# Patient Record
Sex: Female | Born: 1970
Health system: Southern US, Community
[De-identification: ages and names within clinical notes are randomized; demographics above are authoritative.]

## PROBLEM LIST (undated history)

## (undated) DIAGNOSIS — S82201A Unspecified fracture of shaft of right tibia, initial encounter for closed fracture: Secondary | ICD-10-CM

## (undated) DIAGNOSIS — J660 Byssinosis: Secondary | ICD-10-CM

## (undated) DIAGNOSIS — J339 Nasal polyp, unspecified: Secondary | ICD-10-CM

## (undated) DIAGNOSIS — J45909 Unspecified asthma, uncomplicated: Secondary | ICD-10-CM

## (undated) DIAGNOSIS — R0602 Shortness of breath: Secondary | ICD-10-CM

## (undated) DIAGNOSIS — D649 Anemia, unspecified: Secondary | ICD-10-CM

## (undated) HISTORY — PX: FRACTURE SURGERY: SHX138

---

## 1992-04-23 HISTORY — PX: TUBAL LIGATION: SHX77

## 1994-04-23 HISTORY — PX: PATELLA FRACTURE SURGERY: SHX735

## 2009-04-23 HISTORY — PX: NASAL POLYP SURGERY: SHX186

## 2013-05-05 ENCOUNTER — Emergency Department (HOSPITAL_COMMUNITY): Payer: Medicaid Other

## 2013-05-05 ENCOUNTER — Inpatient Hospital Stay (HOSPITAL_COMMUNITY)
Admission: EM | Admit: 2013-05-05 | Discharge: 2013-05-08 | DRG: 203 | Disposition: A | Discharge status: Home / Self Care | Payer: Self-pay | Attending: Internal Medicine | Admitting: Internal Medicine

## 2013-05-05 ENCOUNTER — Encounter (HOSPITAL_COMMUNITY): Payer: Self-pay | Admitting: Emergency Medicine

## 2013-05-05 DIAGNOSIS — Z9119 Patient's noncompliance with other medical treatment and regimen: Secondary | ICD-10-CM

## 2013-05-05 DIAGNOSIS — F411 Generalized anxiety disorder: Secondary | ICD-10-CM | POA: Diagnosis present

## 2013-05-05 DIAGNOSIS — Z825 Family history of asthma and other chronic lower respiratory diseases: Secondary | ICD-10-CM

## 2013-05-05 DIAGNOSIS — J309 Allergic rhinitis, unspecified: Secondary | ICD-10-CM | POA: Diagnosis present

## 2013-05-05 DIAGNOSIS — Z91199 Patient's noncompliance with other medical treatment and regimen due to unspecified reason: Secondary | ICD-10-CM

## 2013-05-05 DIAGNOSIS — Z23 Encounter for immunization: Secondary | ICD-10-CM

## 2013-05-05 DIAGNOSIS — D509 Iron deficiency anemia, unspecified: Secondary | ICD-10-CM | POA: Diagnosis present

## 2013-05-05 DIAGNOSIS — J339 Nasal polyp, unspecified: Secondary | ICD-10-CM | POA: Insufficient documentation

## 2013-05-05 DIAGNOSIS — D649 Anemia, unspecified: Secondary | ICD-10-CM | POA: Diagnosis present

## 2013-05-05 DIAGNOSIS — J45909 Unspecified asthma, uncomplicated: Secondary | ICD-10-CM

## 2013-05-05 DIAGNOSIS — J45901 Unspecified asthma with (acute) exacerbation: Principal | ICD-10-CM | POA: Diagnosis present

## 2013-05-05 HISTORY — DX: Shortness of breath: R06.02

## 2013-05-05 HISTORY — DX: Byssinosis: J66.0

## 2013-05-05 HISTORY — DX: Unspecified asthma, uncomplicated: J45.909

## 2013-05-05 HISTORY — DX: Anemia, unspecified: D64.9

## 2013-05-05 HISTORY — DX: Unspecified fracture of shaft of right tibia, initial encounter for closed fracture: S82.201A

## 2013-05-05 LAB — CBC WITH DIFFERENTIAL/PLATELET
BASOS ABS: 0.1 10*3/uL (ref 0.0–0.1)
BASOS PCT: 1 % (ref 0–1)
EOS ABS: 1 10*3/uL — AB (ref 0.0–0.7)
Eosinophils Relative: 20 % — ABNORMAL HIGH (ref 0–5)
HEMATOCRIT: 33.4 % — AB (ref 36.0–46.0)
Hemoglobin: 10.4 g/dL — ABNORMAL LOW (ref 12.0–15.0)
Lymphocytes Relative: 37 % (ref 12–46)
Lymphs Abs: 1.9 10*3/uL (ref 0.7–4.0)
MCH: 23.9 pg — AB (ref 26.0–34.0)
MCHC: 31.1 g/dL (ref 30.0–36.0)
MCV: 76.8 fL — ABNORMAL LOW (ref 78.0–100.0)
MONO ABS: 0.5 10*3/uL (ref 0.1–1.0)
MONOS PCT: 10 % (ref 3–12)
Neutro Abs: 1.7 10*3/uL (ref 1.7–7.7)
Neutrophils Relative %: 33 % — ABNORMAL LOW (ref 43–77)
Platelets: 418 10*3/uL — ABNORMAL HIGH (ref 150–400)
RBC: 4.35 MIL/uL (ref 3.87–5.11)
RDW: 16.2 % — ABNORMAL HIGH (ref 11.5–15.5)
WBC: 5.3 10*3/uL (ref 4.0–10.5)

## 2013-05-05 LAB — RAPID URINE DRUG SCREEN, HOSP PERFORMED
Amphetamines: NOT DETECTED
BARBITURATES: NOT DETECTED
BENZODIAZEPINES: NOT DETECTED
Cocaine: NOT DETECTED
Opiates: NOT DETECTED
Tetrahydrocannabinol: NOT DETECTED

## 2013-05-05 LAB — BASIC METABOLIC PANEL
BUN: 7 mg/dL (ref 6–23)
CALCIUM: 8.5 mg/dL (ref 8.4–10.5)
CO2: 22 mEq/L (ref 19–32)
CREATININE: 0.76 mg/dL (ref 0.50–1.10)
Chloride: 101 mEq/L (ref 96–112)
Glucose, Bld: 92 mg/dL (ref 70–99)
Potassium: 4.8 mEq/L (ref 3.7–5.3)
Sodium: 137 mEq/L (ref 137–147)

## 2013-05-05 LAB — MRSA PCR SCREENING: MRSA BY PCR: NEGATIVE

## 2013-05-05 MED ORDER — MAGNESIUM SULFATE 40 MG/ML IJ SOLN
2.0000 g | Freq: Once | INTRAMUSCULAR | Status: AC
  Administered 2013-05-05: 2 g via INTRAVENOUS
  Filled 2013-05-05: qty 50

## 2013-05-05 MED ORDER — SODIUM CHLORIDE 0.9 % IV SOLN
INTRAVENOUS | Status: AC
  Administered 2013-05-05: 125 mL/h via INTRAVENOUS
  Administered 2013-05-06: 05:00:00 via INTRAVENOUS

## 2013-05-05 MED ORDER — GUAIFENESIN ER 600 MG PO TB12
600.0000 mg | ORAL_TABLET | Freq: Two times a day (BID) | ORAL | Status: DC
  Administered 2013-05-05 – 2013-05-08 (×6): 600 mg via ORAL
  Filled 2013-05-05 (×7): qty 1

## 2013-05-05 MED ORDER — IPRATROPIUM-ALBUTEROL 0.5-2.5 (3) MG/3ML IN SOLN
3.0000 mL | RESPIRATORY_TRACT | Status: DC
  Administered 2013-05-06 – 2013-05-07 (×9): 3 mL via RESPIRATORY_TRACT
  Filled 2013-05-05 (×9): qty 3

## 2013-05-05 MED ORDER — ONDANSETRON HCL 4 MG/2ML IJ SOLN
4.0000 mg | Freq: Once | INTRAMUSCULAR | Status: AC
  Administered 2013-05-05: 4 mg via INTRAVENOUS
  Filled 2013-05-05: qty 2

## 2013-05-05 MED ORDER — SODIUM CHLORIDE 0.9 % IJ SOLN
3.0000 mL | Freq: Two times a day (BID) | INTRAMUSCULAR | Status: DC
  Administered 2013-05-06 – 2013-05-08 (×4): 3 mL via INTRAVENOUS

## 2013-05-05 MED ORDER — SODIUM CHLORIDE 0.9 % IV BOLUS (SEPSIS)
1000.0000 mL | Freq: Once | INTRAVENOUS | Status: AC
  Administered 2013-05-05: 1000 mL via INTRAVENOUS

## 2013-05-05 MED ORDER — METHYLPREDNISOLONE SODIUM SUCC 125 MG IJ SOLR
80.0000 mg | Freq: Three times a day (TID) | INTRAMUSCULAR | Status: DC
  Administered 2013-05-05 – 2013-05-06 (×2): 80 mg via INTRAVENOUS
  Filled 2013-05-05 (×5): qty 1.28

## 2013-05-05 MED ORDER — SODIUM CHLORIDE 0.9 % IJ SOLN: 3.0000 mL | INTRAMUSCULAR | Status: DC | PRN

## 2013-05-05 MED ORDER — ACETAMINOPHEN 325 MG PO TABS: 650.0000 mg | ORAL_TABLET | Freq: Four times a day (QID) | ORAL | Status: DC | PRN

## 2013-05-05 MED ORDER — DIPHENHYDRAMINE HCL 25 MG PO TABS
25.0000 mg | ORAL_TABLET | Freq: Every evening | ORAL | Status: DC | PRN
  Filled 2013-05-05: qty 1

## 2013-05-05 MED ORDER — ALBUTEROL SULFATE (2.5 MG/3ML) 0.083% IN NEBU: 2.5000 mg | INHALATION_SOLUTION | RESPIRATORY_TRACT | Status: DC | PRN

## 2013-05-05 MED ORDER — SODIUM CHLORIDE 0.9 % IV SOLN: 250.0000 mL | INTRAVENOUS | Status: DC | PRN

## 2013-05-05 MED ORDER — PNEUMOCOCCAL VAC POLYVALENT 25 MCG/0.5ML IJ INJ
0.5000 mL | INJECTION | INTRAMUSCULAR | Status: AC
  Administered 2013-05-06: 0.5 mL via INTRAMUSCULAR
  Filled 2013-05-05: qty 0.5

## 2013-05-05 MED ORDER — ALBUTEROL SULFATE (2.5 MG/3ML) 0.083% IN NEBU
2.5000 mg | INHALATION_SOLUTION | RESPIRATORY_TRACT | Status: DC
  Administered 2013-05-05: 2.5 mg via RESPIRATORY_TRACT

## 2013-05-05 MED ORDER — ALBUTEROL (5 MG/ML) CONTINUOUS INHALATION SOLN
10.0000 mg/h | INHALATION_SOLUTION | Freq: Once | RESPIRATORY_TRACT | Status: AC
  Administered 2013-05-05: 10 mg/h via RESPIRATORY_TRACT

## 2013-05-05 MED ORDER — FLUTICASONE PROPIONATE 50 MCG/ACT NA SUSP
2.0000 | Freq: Every day | NASAL | Status: DC
  Administered 2013-05-05 – 2013-05-07 (×3): 2 via NASAL
  Filled 2013-05-05: qty 16

## 2013-05-05 MED ORDER — ACETAMINOPHEN 650 MG RE SUPP: 650.0000 mg | Freq: Four times a day (QID) | RECTAL | Status: DC | PRN

## 2013-05-05 MED ORDER — ALBUTEROL SULFATE HFA 108 (90 BASE) MCG/ACT IN AERS: 1.0000 | INHALATION_SPRAY | RESPIRATORY_TRACT | Status: DC | PRN

## 2013-05-05 MED ORDER — METHYLPREDNISOLONE SODIUM SUCC 125 MG IJ SOLR
125.0000 mg | Freq: Once | INTRAMUSCULAR | Status: AC
  Administered 2013-05-05: 125 mg via INTRAVENOUS
  Filled 2013-05-05: qty 2

## 2013-05-05 MED ORDER — HEPARIN SODIUM (PORCINE) 5000 UNIT/ML IJ SOLN
5000.0000 [IU] | Freq: Three times a day (TID) | INTRAMUSCULAR | Status: DC
  Administered 2013-05-05 – 2013-05-08 (×7): 5000 [IU] via SUBCUTANEOUS
  Filled 2013-05-05 (×12): qty 1

## 2013-05-05 MED ORDER — ALBUTEROL (5 MG/ML) CONTINUOUS INHALATION SOLN
10.0000 mg/h | INHALATION_SOLUTION | Freq: Once | RESPIRATORY_TRACT | Status: AC
Start: 2013-05-05 — End: 2013-05-05
  Administered 2013-05-05: 10 mg/h via RESPIRATORY_TRACT
  Filled 2013-05-05: qty 20

## 2013-05-05 MED ORDER — IPRATROPIUM BROMIDE 0.02 % IN SOLN
0.5000 mg | RESPIRATORY_TRACT | Status: DC
  Administered 2013-05-05: 0.5 mg via RESPIRATORY_TRACT
  Filled 2013-05-05 (×2): qty 2.5

## 2013-05-05 MED ORDER — IPRATROPIUM BROMIDE 0.02 % IN SOLN
0.5000 mg | RESPIRATORY_TRACT | Status: DC
  Administered 2013-05-05: 0.5 mg via RESPIRATORY_TRACT

## 2013-05-05 MED ORDER — IPRATROPIUM BROMIDE 0.02 % IN SOLN
0.5000 mg | Freq: Once | RESPIRATORY_TRACT | Status: AC
  Administered 2013-05-05: 0.5 mg via RESPIRATORY_TRACT
  Filled 2013-05-05: qty 2.5

## 2013-05-05 MED ORDER — ALBUTEROL SULFATE (2.5 MG/3ML) 0.083% IN NEBU
2.5000 mg | INHALATION_SOLUTION | RESPIRATORY_TRACT | Status: DC
  Administered 2013-05-05: 2.5 mg via RESPIRATORY_TRACT
  Filled 2013-05-05: qty 3

## 2013-05-05 NOTE — ED Notes (Signed)
Pt from brother's house via [facility] with c/o wheezing and shortness of breath starting last night.  Pt is visiting family and did not bring her inhaler.  Pt given neb treatment by EMS.  Pt in NAD, A&O.

## 2013-05-05 NOTE — Progress Notes (Signed)
Patient performed pre treatment peak flow of 175 and a post treatment peak flow of 195 with good patient effort.  Ancil Boozerarrie Janice Blake, RRT, RCP

## 2013-05-05 NOTE — ED Notes (Signed)
Meal tray ordered 

## 2013-05-05 NOTE — ED Provider Notes (Signed)
Medical screening examination/treatment/procedure(s) were conducted as a shared visit with non-physician practitioner(s) and myself.  I personally evaluated the patient during the encounter.  EKG Interpretation   None        CRITICAL CARE Performed by: Dagmar HaitWALDEN, WILLIAM D'Arcy Abraha   Total critical care time: 30 minutes  Critical care time was exclusive of separately billable procedures and treating other patients.  Critical care was necessary to treat or prevent imminent or life-threatening deterioration.  Critical care was time spent personally by me on the following activities: development of treatment plan with patient and/or surrogate as well as nursing, discussions with consultants, evaluation of patient's response to treatment, examination of patient, obtaining history from patient or surrogate, ordering and performing treatments and interventions, ordering and review of laboratory studies, ordering and review of radiographic studies, pulse oximetry and re-evaluation of patient's condition.  Patient here with asthma. Has had intubations for asthma before. She has asthma from occupational exposure many years ago. Patient extremely tight with diffuse wheezing and decreased breath sounds. Initially given 1 hour long albuterol treatment and steroids. Patient without improvement so magnesium given and second hour-long treatment given. Patient admitted to medicine to step down unit  Dagmar HaitWilliam Avelyn Touch, MD 05/05/13 [phone]

## 2013-05-05 NOTE — H&P (Signed)
Hospital Admission Note Date: 05/05/2013  Patient name: Janice BooksVictoria Blake Medical record number: [id number] Date of birth: 09/12/1970 Age: 43 y.o. Gender: female PCP: No primary provider on file.  Medical Service: IMTS  Attending physician: Dr. Kem KaysPaya  Internal Medicine Teaching Service Contact Information  Weekday Hours (7AM-5PM):   1st Contact:  Dr. Aundria Rudogers            Pager: [phone] 2nd Contact: Dr.  Shirlee LatchMcLean           Pager:   [phone]  ** If no return call within 15 minutes (after trying both pagers listed above), please call after hours pagers.   After 5 pm or weekends: 1st Contact: Pager: [phone] 2nd Contact: Pager: [phone]  Chief Complaint: SOB  History of Present Illness:  Patient is a 43 year old lady with past medical history of asthma, history of intubation due to severe asthma exacerbation (year 2000), anemia, allergic rhinitis, who presents with shortness of breath, wheezing and cough.   Her asthma was diagnosed at age of 43. She was supposed to use albuterol inhaler, albuterol nebulizer, Advair inhaler, and Atrovent inhaler at home. She ran out of her albuterol and Advair inhalers for almost 3 weeks. This is because she could not afford the medications due to lack of insurance. 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 The patient reports that she started having cough and wheezing 2 weeks ago. She has been using Atrovent inhaler only. Her symptoms have been progressively getting worse. Her SOB and wheezing became so severe last night that she felt very tight in her chest, but no chest pain. Her cough is dry without sputum production. She has hx of allergic rhinitis. She has mild runny nose, but no other symptoms for viral infection, such as sore throat, fever, chills or body aching. She did not have sick contacts recently. No history of long distance traveling recently. No pain in calf areas.  Patient was treated with continuous albuterol and  Atrovent nebulizers in ED, received 1 dose of magnesium sulfate and one dose of Solu-Medrol by IV in ED. Her symptoms improved slightly and could talk in full sentence when I evaluated patient in ED.   ROS:  Denies fever, chills, headaches, chest pain, abdominal pain, diarrhea, constipation, dysuria, urgency, frequency, hematuria, joint pain or leg swelling. Has mild headache, heavy menstrual and running nose.   Meds: Current Outpatient Rx  Name  Route  Sig  Dispense  Refill  . albuterol (PROVENTIL HFA;VENTOLIN HFA) 108 (90 BASE) MCG/ACT inhaler   Inhalation   Inhale 2 puffs into the lungs every 4 (four) hours as needed for wheezing or shortness of breath.         Marland Kitchen. albuterol (PROVENTIL) (2.5 MG/3ML) 0.083% nebulizer solution   Nebulization   Take 2.5 mg by nebulization every 4 (four) hours as needed for wheezing or shortness of breath.         . diphenhydrAMINE (BENADRYL) 25 MG tablet   Oral   Take 25 mg by mouth at bedtime as needed for allergies.         . Fluticasone-Salmeterol (ADVAIR) 500-50 MCG/DOSE AEPB   Inhalation   Inhale 1 puff into the lungs 2 (two) times daily.         Marland Kitchen. ipratropium (ATROVENT HFA) 17 MCG/ACT inhaler   Inhalation   Inhale 2 puffs into the lungs 2 (two) times daily.  Allergies: Allergies as of 05/05/2013 - Review Complete 05/05/2013  Allergen Reaction Noted  . Amoxicillin Hives and Itching 05/05/2013  . Levaquin [levofloxacin in d5w] Hives and Itching 05/05/2013   Past Medical History  Diagnosis Date  . Asthma   . Fracture of right tibia    Past Surgical History  Procedure Laterality Date  . Tubal ligation    . Fracture surgery    . Nasal polyps removed     Family history: Mother has a allergy, father has asthma and hypertension.  2 brothers and one sister are healthy.  History   Social History  . Marital Status: Single    Spouse Name: N/A    Number of Children: N/A   . Years of Education: N/A   Occupational History  . Not on file.   Social History Main Topics  . Smoking status: Never Smoker   . Smokeless tobacco: Never Used  . Alcohol Use: No  . Drug Use: No  . Sexual Activity: Not on file   Other Topics Concern  . Not on file   Social History Narrative  . Single, lives with her brother in [place], has 4 sons who are healthy. Denies smoking, drinking alcohol or using drugs.    Review of Systems: Full 14-point review of systems otherwise negative except as noted above in HPI. Physical Exam:   Filed Vitals:   05/05/13 0930 05/05/13 0940 05/05/13 1100 05/05/13 1134  BP: 134/83  134/80   Pulse: 84  109   Temp:      TempSrc:      Resp:      SpO2: 96% 100% 100% 96%    General: Not in acute distress HEENT: PERRL, EOMI, no scleral icterus, No JVD or bruit. Dry mucus and membrane.  Cardiac: S1/S2, RRR, No murmurs, gallops or rubs Pulm: Decreased air movement bilaterally. Diffused wheezing bilaterally. No rales or rubs. Abd: Soft, nondistended, nontender, no rebound pain, no organomegaly, BS present Ext: No edema. 2+DP/PT pulse bilaterally Musculoskeletal: No joint deformities, erythema, or stiffness, ROM full Skin: No rashes.  Neuro: Alert and oriented X3, cranial nerves II-XII grossly intact, muscle strength 5/5 in all extremeties, sensation to light touch intact. Brachial reflex 2+ bilaterally. Knee reflex 1+ bilaterally. Psych: Patient is not psychotic, no suicidal or hemocidal ideation.  Lab results: Basic Metabolic Panel:  Recent Labs  16/10/96 1005  NA 137  K 4.8  CL 101  CO2 22  GLUCOSE 92  BUN 7  CREATININE 0.76  CALCIUM 8.5   Liver Function Tests: No results found for this basename: AST, ALT, ALKPHOS, BILITOT, PROT, ALBUMIN,  in the last 72 hours No results found for this basename: LIPASE, AMYLASE,  in the last 72 hours No results found for this basename: AMMONIA,  in the last 72 hours CBC:  Recent Labs   05/05/13 1005  WBC 5.3  NEUTROABS 1.7  HGB 10.4*  HCT 33.4*  MCV 76.8*  PLT 418*   Cardiac Enzymes: No results found for this basename: CKTOTAL, CKMB, CKMBINDEX, TROPONINI,  in the last 72 hours BNP: No results found for this basename: PROBNP,  in the last 72 hours D-Dimer: No results found for this basename: DDIMER,  in the last 72 hours CBG: No results found for this basename: GLUCAP,  in the last 72 hours Hemoglobin A1C: No results found for this basename: HGBA1C,  in the last 72 hours Fasting Lipid Panel: No results found for this basename: CHOL, HDL, LDLCALC, TRIG, CHOLHDL, LDLDIRECT,  in  the last 72 hours Thyroid Function Tests: No results found for this basename: TSH, T4TOTAL, FREET4, T3FREE, THYROIDAB,  in the last 72 hours Anemia Panel: No results found for this basename: VITAMINB12, FOLATE, FERRITIN, TIBC, IRON, RETICCTPCT,  in the last 72 hours Coagulation: No results found for this basename: LABPROT, INR,  in the last 72 hours Urine Drug Screen: Drugs of Abuse  No results found for this basename: labopia,  cocainscrnur,  labbenz,  amphetmu,  thcu,  labbarb    Alcohol Level: No results found for this basename: ETH,  in the last 72 hours Urinalysis: No results found for this basename: COLORURINE, APPERANCEUR, LABSPEC, PHURINE, GLUCOSEU, HGBUR, BILIRUBINUR, KETONESUR, PROTEINUR, UROBILINOGEN, NITRITE, LEUKOCYTESUR,  in the last 72 hours Misc. Labs:  Imaging results:  Dg Chest Portable 1 View  05/05/2013   CLINICAL DATA:  Shortness of breath and wheezing  EXAM: PORTABLE CHEST - 1 VIEW  COMPARISON:  None.  FINDINGS: Lungs are mildly hyperexpanded but clear. Heart size and pulmonary vascularity are normal. No adenopathy. No pneumothorax. No bone lesions.  IMPRESSION: Lungs mildly hyperexpanded but clear.   Electronically Signed   By: [name]   On: 05/05/2013 10:11   Other results: EKG:  Assessment & Plan by Problem:  #: Asthma exacerbation: Patient's  symptoms are most likely caused asthma exacerbation, 2/2 to noncompliance to medications. Patient does not have signs of upper respiratory infection or PNA given negative chest x-ray, no fever or chills, no leukocytosis. Her symptoms improved slightly after brief treatment in Ed. Given hx of intubation, patient needs to be observed closely. Currently she is a hemodynamically stable. No indication for antibiotics.  Plan:  - Admit to SDU for close obervation.  - Albuterol/ Atrovent nebs q2h scheduled and q2h albuterol neb PRN.  - will re-evaluate in a few hours, if improving signicantly, will titrate down the  frequency of nebulizers  - IV solumedrol 80mg  q8 hours, will titrate down or switch to oral prednisone when clinically improved - Continuous pulse ox, with O2 supplementation if O2 sat < 92%.  - Robitussin for cough - check peak flow  # Anemia: Hgb 10.4. MCV 76.8.  Patient was diagnosed with rion deficiency anemia before. She was supposed to take iron supplement, but not compliance to medications. She reports regular, but heavy menstrual periods.  -will check anemia panel  #: Allergic rhinitis: has running nose. S/p of nasal polyps 2011.  On Benadryl at home  -will continue Benadryl  -start Flonase.   #  F/E/N  -NS IV: 125cc/hr -Electrolytes: fine on admission. -diet: regular  # DVT px: Heparin sq  Dispo: Disposition is deferred at this time, awaiting improvement of current medical problems. Anticipated discharge in approximately 1 to 2 day(s).   The patient does not have a current PCP (No primary provider on file.), therefore is not requiring [facility]PC follow-up after discharge.   The patient does not have transportation limitations that hinder transportation to clinic appointments.  Signed:  Lorretta Harp, MD PGY3, Internal Medicine Teaching Service Pager: [phone]  05/05/2013, 1:21 PM

## 2013-05-05 NOTE — ED Notes (Signed)
IV team paged.  

## 2013-05-05 NOTE — ED Notes (Signed)
Admitting MD at bedside.

## 2013-05-05 NOTE — ED Provider Notes (Signed)
CSN: [id number]     Arrival date & time 05/05/13  0848 History   First MD Initiated Contact with Patient 05/05/13 [id number]     Chief Complaint  Patient presents with  . Wheezing   (Consider location/radiation/quality/duration/timing/severity/associated sxs/prior Treatment) HPI Comments: Patient is a 43 year old female with history of asthma who presents today for wheezing. She reports her shortness of breath and wheezing began last night and worsened throughout the night. She used her Ventolin with no relief of her symptoms. She takes Advair daily. She has been compliant with these medications. 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 She has been intubated in 2000 for her asthma. She has had congestion, rhinorrhea for the past 3 days.  Patient is a [age] y.o. female presenting with wheezing. The history is provided by the patient. No language interpreter was used.  Wheezing Associated symptoms: chest tightness and rhinorrhea   Associated symptoms: no fever     Past Medical History  Diagnosis Date  . Asthma   . Fracture of right tibia    Past Surgical History  Procedure Laterality Date  . Tubal ligation    . Fracture surgery    . Nasal polyps removed     History reviewed. No pertinent family history. History  Substance Use Topics  . Smoking status: Never Smoker   . Smokeless tobacco: Never Used  . Alcohol Use: No   OB History   Grav Para Term Preterm Abortions TAB SAB Ect Mult Living                 Review of Systems  Constitutional: Negative for fever and chills.  HENT: Positive for congestion and rhinorrhea.   Respiratory: Positive for chest tightness and wheezing.   Gastrointestinal: Negative for nausea, vomiting and abdominal pain.  All other systems reviewed and are negative.    Allergies  Amoxicillin  Home Medications  No current outpatient prescriptions on file. BP 152/88  Pulse 81  Temp(Src) 98 F (36.7 C) (Oral)  Resp 22   SpO2 100%  LMP 04/30/2013 Physical Exam  Nursing note and vitals reviewed. Constitutional: She is oriented to person, place, and time. She appears well-developed and well-nourished. No distress.  HENT:  Head: Normocephalic and atraumatic.  Right Ear: External ear normal.  Left Ear: External ear normal.  Nose: Nose normal.  Mouth/Throat: Oropharynx is clear and moist.  Eyes: Conjunctivae are normal.  Neck: Normal range of motion.  Cardiovascular: Normal rate, regular rhythm and normal heart sounds.   Pulmonary/Chest: Effort normal. No stridor. No respiratory distress. She has wheezes (inspiratory and expiratory). She has no rales.  Abdominal: Soft. She exhibits no distension.  Musculoskeletal: Normal range of motion.  Neurological: She is alert and oriented to person, place, and time. She has normal strength.  Skin: Skin is warm and dry. She is not diaphoretic. No erythema.  Psychiatric: She has a normal mood and affect. Her behavior is normal.    ED Course  Procedures (including critical care time) Labs Review Labs Reviewed  CBC WITH DIFFERENTIAL - Abnormal; Notable for the following:    Hemoglobin 10.4 (*)    HCT 33.4 (*)    MCV 76.8 (*)    MCH 23.9 (*)    RDW 16.2 (*)    Platelets 418 (*)    Neutrophils Relative % 33 (*)    Eosinophils Relative 20 (*)    Eosinophils Absolute 1.0 (*)    All other components within normal  limits  BASIC METABOLIC PANEL   Imaging Review Dg Chest Portable 1 View  05/05/2013   CLINICAL DATA:  Shortness of breath and wheezing  EXAM: PORTABLE CHEST - 1 VIEW  COMPARISON:  None.  FINDINGS: Lungs are mildly hyperexpanded but clear. Heart size and pulmonary vascularity are normal. No adenopathy. No pneumothorax. No bone lesions.  IMPRESSION: Lungs mildly hyperexpanded but clear.   Electronically Signed   By: [name]   On: 05/05/2013 10:11    EKG Interpretation   None       MDM   1. Asthma   2. Allergic rhinitis   3. Anemia    4. Asthma exacerbation    Patient presents to emergency room with asthma exacerbation. She continues to have inspiratory and expiratory wheezes despite continuous nebulizer treatment. Labs and CXR are unremarkable. The patient was given 125 mg of Solu-Medrol in the emergency department as well as magnesium. She was ordered another hour-long neb treatment. She will be admitted to the internal medicine teaching service. Admission is appreciated. Vital signs are stable at this time. Dr. Gwendolyn Grant evaluated patient and agrees with plan. Patient / Family / Caregiver informed of clinical course, understand medical decision-making process, and agree with plan.       Mora Bellman, PA-C 05/05/13 1434

## 2013-05-06 DIAGNOSIS — J309 Allergic rhinitis, unspecified: Secondary | ICD-10-CM

## 2013-05-06 DIAGNOSIS — J45901 Unspecified asthma with (acute) exacerbation: Principal | ICD-10-CM

## 2013-05-06 DIAGNOSIS — D649 Anemia, unspecified: Secondary | ICD-10-CM

## 2013-05-06 LAB — FERRITIN: Ferritin: 5 ng/mL — ABNORMAL LOW (ref 10–291)

## 2013-05-06 LAB — VITAMIN B12: Vitamin B-12: 474 pg/mL (ref 211–911)

## 2013-05-06 LAB — RETICULOCYTES
RBC.: 3.97 MIL/uL (ref 3.87–5.11)
RETIC COUNT ABSOLUTE: 51.6 10*3/uL (ref 19.0–186.0)
Retic Ct Pct: 1.3 % (ref 0.4–3.1)

## 2013-05-06 LAB — IRON AND TIBC
IRON: 14 ug/dL — AB (ref 42–135)
Saturation Ratios: 4 % — ABNORMAL LOW (ref 20–55)
TIBC: 356 ug/dL (ref 250–470)
UIBC: 342 ug/dL (ref 125–400)

## 2013-05-06 LAB — FOLATE: Folate: 14.2 ng/mL

## 2013-05-06 MED ORDER — METHYLPREDNISOLONE SODIUM SUCC 125 MG IJ SOLR
125.0000 mg | Freq: Once | INTRAMUSCULAR | Status: AC
  Administered 2013-05-07: 125 mg via INTRAVENOUS
  Filled 2013-05-06: qty 2

## 2013-05-06 MED ORDER — LORATADINE 10 MG PO TABS
10.0000 mg | ORAL_TABLET | Freq: Every day | ORAL | Status: DC
  Administered 2013-05-07 – 2013-05-08 (×3): 10 mg via ORAL
  Filled 2013-05-06 (×3): qty 1

## 2013-05-06 MED ORDER — PREDNISONE 50 MG PO TABS
60.0000 mg | ORAL_TABLET | Freq: Every day | ORAL | Status: DC
  Administered 2013-05-07 – 2013-05-08 (×2): 60 mg via ORAL
  Filled 2013-05-06 (×4): qty 1

## 2013-05-06 MED ORDER — LORATADINE 10 MG PO TABS: 10.0000 mg | ORAL_TABLET | Freq: Every day | ORAL | Status: DC

## 2013-05-06 NOTE — Progress Notes (Signed)
Pre tx. Peak flow: 150, post tx. Peak flow: 200 with good pt. Effort.

## 2013-05-06 NOTE — Progress Notes (Signed)
Pre tx. Peak flow: 160, post tx. Peak flow: 180 with good pt. Effort.

## 2013-05-06 NOTE — Progress Notes (Signed)
Subjective: Ms. Stallman is doing much better this morning.  Breathing comfortably, speaking in full sentences.  Unable to perform peak flow without coughing though 200 recorded yesterday.    Objective: Vital signs in last 24 hours: Filed Vitals:   05/06/13 0010 05/06/13 0400 05/06/13 0410 05/06/13 0816  BP: 128/79  125/82   Pulse: 108 99 96   Temp: 97.9 F (36.6 C)  97.9 F (36.6 C)   TempSrc: Oral  Oral   Resp: 26 21 24    Height:      Weight:   166 lb 3.6 oz (75.4 kg)   SpO2: 98% 98% 98% 97%   Weight change:   Intake/Output Summary (Last 24 hours) at 05/06/13 0844 Last data filed at 05/06/13 0600  Gross per 24 hour  Intake 2197.92 ml  Output      0 ml  Net 2197.92 ml   PEX General: alert, cooperative, and NAD, very talkative HEENT: NCAT, no scleral icterus Neck: supple, no lymphadenopathy Lungs: relatively good air movement with diffuse wheezing, normal work of respiration, no rales, ronchi Heart: regular rate and rhythm, no murmurs, gallops, or rubs Abdomen: soft, non-tender, non-distended, normal bowel sounds Extremities: 2+ DP/PT pulses bilaterally, no cyanosis, clubbing, or edema Neurologic: alert & oriented X3, cranial nerves II-XII intact, strength grossly intact, sensation intact to light touch   Lab Results: Basic Metabolic Panel:  Recent Labs Lab 05/05/13 1005  NA 137  K 4.8  CL 101  CO2 22  GLUCOSE 92  BUN 7  CREATININE 0.76  CALCIUM 8.5   CBC:  Recent Labs Lab 05/05/13 1005  WBC 5.3  NEUTROABS 1.7  HGB 10.4*  HCT 33.4*  MCV 76.8*  PLT 418*   Anemia Panel:  Recent Labs Lab 05/06/13 0323  RETICCTPCT 1.3   Urine Drug Screen: Drugs of Abuse     Component Value Date/Time   LABOPIA NONE DETECTED 05/05/2013 2215   COCAINSCRNUR NONE DETECTED 05/05/2013 2215   LABBENZ NONE DETECTED 05/05/2013 2215   AMPHETMU NONE DETECTED 05/05/2013 2215   THCU NONE DETECTED 05/05/2013 2215   LABBARB NONE DETECTED 05/05/2013 2215     Micro  Results: Recent Results (from the past 240 hour(s))  MRSA PCR SCREENING     Status: None   Collection Time    05/05/13  6:13 PM      Result Value Range Status   MRSA by PCR NEGATIVE  NEGATIVE Final   Comment:            The GeneXpert MRSA Assay (FDA     approved for NASAL specimens     only), is one component of a     comprehensive MRSA colonization     surveillance program. It is not     intended to diagnose MRSA     infection nor to guide or     monitor treatment for     MRSA infections.   Studies/Results: Dg Chest Portable 1 View  05/05/2013   CLINICAL DATA:  Shortness of breath and wheezing  EXAM: PORTABLE CHEST - 1 VIEW  COMPARISON:  None.  FINDINGS: Lungs are mildly hyperexpanded but clear. Heart size and pulmonary vascularity are normal. No adenopathy. No pneumothorax. No bone lesions.  IMPRESSION: Lungs mildly hyperexpanded but clear.   Electronically Signed   By: [name]   On: 05/05/2013 10:11   Medications: I have reviewed the patient's current medications. Scheduled Meds: . fluticasone  2 spray Each Nare Daily  . guaiFENesin  600 mg Oral BID  . heparin  5,000 Units Subcutaneous Q8H  . ipratropium-albuterol  3 mL Nebulization Q4H  . methylPREDNISolone (SOLU-MEDROL) injection  80 mg Intravenous Q8H  . pneumococcal 23 valent vaccine  0.5 mL Intramuscular Tomorrow-1000  . sodium chloride  3 mL Intravenous Q12H   Continuous Infusions:  PRN Meds:.sodium chloride, acetaminophen, acetaminophen, albuterol, diphenhydrAMINE, sodium chloride Assessment/Plan: # Asthma exacerbation: 2/2 medication noncompliance x 3 weeks. Still has mild tachypnea and diffuse wheezing but breathing comfortably and speaking in full sentences.  Negative CXR (hyperexpanded but clear), afebrile, no leukocytosis.  Calculated peak flow 448.  -transfer to med-surg -Albuterol/ Atrovent nebs q4h scheduled and q2h albuterol neb q4h PRN -convert to prednisone 60 mg PO  -start Singulair 10 mg  daily -check peak flow  -RN to ambulate with pulse ox  # Anemia: Hgb 10.4, MCV 76.8. Patient has history of iron deficiency anemia, noncompliant with iron supplementation. She reports heavy menstrual periods.  Ferritin 5 (low), B12 and folate within normal limits.   # Allergic rhinitis: has running nose. S/p of nasal polyps 2011. On Benadryl at home  -will continue Benadryl  -start Flonase.    Dispo: Disposition is deferred at this time, awaiting improvement of current medical problems.  Anticipated discharge tomorrow.   The patient does not have a current PCP (No primary provider on file.) and [facility] after discharge.   .Services Needed at time of discharge: Y = Yes, Blank = No PT:   OT:   RN:   Equipment:   Other:     LOS: 1 day   Rocco SereneMorgan Anacarolina Evelyn, MD 05/06/2013, 8:44 AM

## 2013-05-06 NOTE — Care Management Note (Addendum)
    Page 1 of 1   05/08/2013     11:57:09 AM   CARE MANAGEMENT NOTE 05/08/2013  Patient:  Janice Blake,Janice Blake   Account Number:  [id number]  Date Initiated:  05/06/2013  Documentation initiated by:  MAYO,HENRIETTA  Subjective/Objective Assessment:   adm with dx of asthma exac; lives with brother     Action/Plan:   Anticipated DC Date:  05/08/2013   Anticipated DC Plan:  HOME/SELF CARE      DC Planning Services  CM consult  MATCH Program      Choice offered to / List presented to:             Status of service:  Completed, signed off Medicare Important Message given?   (If response is "NO", the following Medicare IM given date fields will be blank) Date Medicare IM given:   Date Additional Medicare IM given:    Discharge Disposition:  HOME/SELF CARE  Per UR Regulation:  Reviewed for med. necessity/level of care/duration of stay  If discussed at Long Length of Stay Meetings, dates discussed:    Comments:  05/08/13 11:53 Letha Capeeborah Epifanio Labrador RN, BSN [phone] patient states she lives with her brother she is from [place], she will be followed up with internal Med Clinic.  NCM gave patient Match Letter, she has some of the meds at home which she will pick up later but she will pick 3 of them up todlay.  Informed her she has 7 days from today to pick up the rest of meds.  NCM faxed scripts and Match letter to [facility] [place].  Patient states her brother will be bringing her bus fair, informed her if she needs a bus pass we can get that for her, she states she will be ok , she just needs to get to the house and her car is there and then she can drive where she needs to go.  05/06/13 1012 Henrietta Mayo RN MSN BSN CCM Pt relocated to [place] from [place].  [facility] will follow her in Internal Medicine Clinic and they have arranged appt for 1/22.  Provided pt with information on health insurance helpline to assist her in finding a medical insurance plan. [facility] to provide 34 day supply of medication when discharged.

## 2013-05-06 NOTE — Progress Notes (Addendum)
CSW consulted because pt has no insurance. CSW called financial counselor and left a voicemail making referral. CSW signing off.   Maryclare LabradorJulie Mate Alegria, MSW, [facility] Clinical Social Worker [phone]

## 2013-05-06 NOTE — H&P (Signed)
INTERNAL MEDICINE TEACHING SERVICE Attending Admission Note  Date: 05/06/2013  Patient name: Janice Blake  Medical record number: [id number]  Date of birth: 09/22/1970    I have seen and evaluated Janice Blake and discussed their care with the Residency Team.  43 yr old female with pmhx significant for asthma (w/ intubation in 2000), anemia, allergic rhinitis, presented with SOB and cough.  She has a dry cough, denies myalgias. She has been appropriately treated with intravenous steroids and nebulizer therapy. She admits to having some confusion in the past regarding her appropriate treatment, as her PCP has given her multiple inhalers that she feels are duplicate therapy. Today, she admits she feels better. But, she is still SOB when walking to the restroom. On exam, she has diffuse bilateral exp wheezing. She was unable to perform PEF due to cough. At this time, I would change her to PO steroids with prednisone 60 mg daily. Continue schedule albuterol/ipratropium every 4 hours. She may benefit from Singular, if she can afford this. Otherwise, agree with Advair 500/50 q12h and atrovent 2 puffs four times daily. Ambulate and measure O2 sat.  Jonah BlueAlejandro Magaret Justo, DO, FACP Faculty [facility] 05/06/2013, 11:50 AM

## 2013-05-07 ENCOUNTER — Encounter (HOSPITAL_COMMUNITY): Payer: Self-pay | Admitting: Internal Medicine

## 2013-05-07 DIAGNOSIS — J45909 Unspecified asthma, uncomplicated: Secondary | ICD-10-CM

## 2013-05-07 DIAGNOSIS — J45901 Unspecified asthma with (acute) exacerbation: Secondary | ICD-10-CM

## 2013-05-07 LAB — GLUCOSE, CAPILLARY: Glucose-Capillary: 104 mg/dL — ABNORMAL HIGH (ref 70–99)

## 2013-05-07 MED ORDER — MONTELUKAST SODIUM 10 MG PO TABS
10.0000 mg | ORAL_TABLET | Freq: Every day | ORAL | Status: DC
  Administered 2013-05-07: 10 mg via ORAL
  Filled 2013-05-07 (×2): qty 1

## 2013-05-07 MED ORDER — IPRATROPIUM BROMIDE 0.02 % IN SOLN: 2.5000 mL | Freq: Four times a day (QID) | RESPIRATORY_TRACT | Status: DC

## 2013-05-07 MED ORDER — MOMETASONE FURO-FORMOTEROL FUM 200-5 MCG/ACT IN AERO
2.0000 | INHALATION_SPRAY | Freq: Two times a day (BID) | RESPIRATORY_TRACT | Status: DC
  Filled 2013-05-07: qty 8.8

## 2013-05-07 MED ORDER — ALBUTEROL SULFATE (2.5 MG/3ML) 0.083% IN NEBU: 2.5000 mg | INHALATION_SOLUTION | Freq: Four times a day (QID) | RESPIRATORY_TRACT | Status: DC | PRN

## 2013-05-07 MED ORDER — BENZONATATE 100 MG PO CAPS
100.0000 mg | ORAL_CAPSULE | Freq: Three times a day (TID) | ORAL | Status: DC | PRN
  Administered 2013-05-07 – 2013-05-08 (×2): 100 mg via ORAL
  Filled 2013-05-07 (×3): qty 1

## 2013-05-07 MED ORDER — IPRATROPIUM-ALBUTEROL 0.5-2.5 (3) MG/3ML IN SOLN
3.0000 mL | Freq: Four times a day (QID) | RESPIRATORY_TRACT | Status: DC
  Administered 2013-05-07 – 2013-05-08 (×2): 3 mL via RESPIRATORY_TRACT
  Filled 2013-05-07 (×2): qty 3

## 2013-05-07 MED ORDER — MOMETASONE FURO-FORMOTEROL FUM 200-5 MCG/ACT IN AERO
2.0000 | INHALATION_SPRAY | Freq: Two times a day (BID) | RESPIRATORY_TRACT | Status: DC
  Administered 2013-05-08: 2 via RESPIRATORY_TRACT
  Filled 2013-05-07 (×3): qty 8.8

## 2013-05-07 NOTE — Progress Notes (Signed)
Pt ambulates in the hallway with no c/o SOB,sats was fine 95-98%

## 2013-05-07 NOTE — Progress Notes (Signed)
Peak flow not done, patient with frequent, spontaneous cough.

## 2013-05-07 NOTE — Consult Note (Signed)
Name: Janice Blake MRN: [id number] DOB: 01/20/1971    ADMISSION DATE:  05/05/2013 CONSULTATION DATE:  05/07/2013  REFERRING MD :  Kem Kays PRIMARY SERVICE:  Internal Medicing  CHIEF COMPLAINT:  Asthma  BRIEF PATIENT DESCRIPTION: 43 year old female with history of asthma (requiring intubation in 2000) and anemia who did not have access to her medications for 3 weeks prior to presentation. Admitted 1/13 with asthma exacerbation. She is unhappy with current med regimen, PCCM has been asked to see.   SIGNIFICANT EVENTS / STUDIES:  1/13 - Admitted to [facility]  LINES / TUBES: PIV  CULTURES:   ANTIBIOTICS:   HISTORY OF PRESENT ILLNESS: Diagnosed with Asthma 15 years ago. Her asthma is exacerbated by anxiety. Home medications include albuterol and  Advair. In the past she has been maintained well on advair. She would only need rescue albuterol about twice a week while on it. Once or twice a year she would have an exacerbation that could not be treated with rescue inhaler for which she would be prescribed a prednisone taper. She ran out of her home medications 3 weeks before admission for financial reasons.She presented on 1/13 with non-productive cough and sob x2 weeks, which had continued to worsen on. Patient was treated with continuous albuterol and Atrovent nebulizers in ED, and was started on IV steroids. Her symptoms have improved, but she is currently unhappy with her medication regimen, which is why PCCM has been asked to see her.   PAST MEDICAL HISTORY :  Past Medical History  Diagnosis Date  . Asthma   . Fracture of right tibia   . Shortness of breath     "just related to the asthma" (05/05/2013)  . Anemia    Past Surgical History  Procedure Laterality Date  . Fracture surgery    . Nasal polyp surgery Bilateral 2011  . Patella fracture surgery Right 1996    "crushed; rod w/12 screws on one side, 6 screws on the other"  . Tubal ligation  1994   Prior to Admission medications     Medication Sig Start Date End Date Taking? Authorizing Provider  albuterol (PROVENTIL HFA;VENTOLIN HFA) 108 (90 BASE) MCG/ACT inhaler Inhale 2 puffs into the lungs every 4 (four) hours as needed for wheezing or shortness of breath.   Yes Historical Provider, MD  albuterol (PROVENTIL) (2.5 MG/3ML) 0.083% nebulizer solution Take 2.5 mg by nebulization every 4 (four) hours as needed for wheezing or shortness of breath.   Yes Historical Provider, MD  diphenhydrAMINE (BENADRYL) 25 MG tablet Take 25 mg by mouth at bedtime as needed for allergies.   Yes Historical Provider, MD  Fluticasone-Salmeterol (ADVAIR) 500-50 MCG/DOSE AEPB Inhale 1 puff into the lungs 2 (two) times daily.   Yes Historical Provider, MD  ipratropium (ATROVENT HFA) 17 MCG/ACT inhaler Inhale 2 puffs into the lungs 2 (two) times daily.   Yes Historical Provider, MD   Allergies  Allergen Reactions  . Amoxicillin Hives and Itching  . Levaquin [Levofloxacin In D5w] Hives and Itching    FAMILY HISTORY:  History reviewed. No pertinent family history. SOCIAL HISTORY:  reports that she has never smoked. She has never used smokeless tobacco. She reports that she does not drink alcohol or use illicit drugs.  Review of Systems:   Bolds are positive  Constitutional: weight loss, gain, night sweats, Fevers, chills, fatigue .  HEENT: headaches, Sore throat, sneezing, nasal congestion, post nasal drip, Difficulty swallowing, Tooth/dental problems, visual complaints visual changes, ear ache CV:  chest pain, radiates: ,Orthopnea, PND, swelling in lower extremities, dizziness, palpitations, syncope.  GI  heartburn, indigestion, abdominal pain, nausea, vomiting, diarrhea, change in bowel habits, loss of appetite, bloody stools.  Resp: cough, non-productive, hemoptysis, dyspnea, chest pain, pleuritic.  Skin: rash or itching or icterus GU: dysuria, change in color of urine, urgency or frequency. flank pain, hematuria  MS: joint pain or swelling.  decreased range of motion  Psych: change in mood or affect. depression or anxiety.  Neuro: difficulty with speech, weakness, numbness, ataxia   SUBJECTIVE:  1/15: She is feeling better today after a difficult night. She is breathing better today.   VITAL SIGNS: Temp:  [98.2 F (36.8 C)-98.6 F (37 C)] 98.6 F (37 C) (01/15 0440) Pulse Rate:  [73-105] 75 (01/15 0443) Resp:  [14-34] 22 (01/15 0443) BP: (125-149)/(70-95) 130/95 mmHg (01/15 0440) SpO2:  [98 %-100 %] 98 % (01/15 0443)  PHYSICAL EXAMINATION: General:  43 year old female of normal body habitus in no apparent distress Neuro:  Awake, Alert, oriented.  HEENT:  Normocephalic, PERRL Cardiovascular:RRR   Lungs: Wheezes throughout Abdomen:  Soft, Non-tender, non-distended Musculoskeletal:  intact Skin:  intact   Recent Labs Lab 05/05/13 1005  NA 137  K 4.8  CL 101  CO2 22  BUN 7  CREATININE 0.76  GLUCOSE 92    Recent Labs Lab 05/05/13 1005  HGB 10.4*  HCT 33.4*  WBC 5.3  PLT 418*   No results found.  ASSESSMENT / PLAN:  Asthma Exacerbation: The patient states that she has been well managed on advair and a rescue inhaler in the past. She is willing to use this regimen again as an outpatient. The concern is whether or not she is able to afford these medications. Case management is on board and their input is appreciated. She will need to f/u with [facility] as an outpatient.   -Start Advair at home dose -Continue Prednisone taper -Continue Duonebs -Continue mucinex, claritin    Joneen RoachPaul Hoffman Pulmonary and Critical Care Medicine [facility] Pager# [phone] 05/07/2013, 10:46 AM  Attending:  I have seen and examined the patient with nurse practitioner/resident and agree with the note above.   It's not clear to me what is causing this, but I suspect a viral illness considering the time of year.  Clearly being off of Advair didn't help.  She may need more than advair in the future.  She  needs to see me in the office after her discharge.  She looks good today.  She needs Advair at discharge and a prednisone taper over the next two weeks.  No need for more than one steroid, explained this to her today.  Will see her in the office.  PCCM will sign off, call if questions.  Yolonda KidaMCQUAID, [name] Colwell PCCM Pager: [phone] Cell: [phone] If no response, call [phone]

## 2013-05-07 NOTE — Progress Notes (Signed)
Pt arrived to unit via WC. No c/o of SOB. Oriented to room. Will continue to monitor pt.

## 2013-05-07 NOTE — Progress Notes (Signed)
Subjective: [name] is doing much better this morning.  Breathing comfortably, speaking in full sentences.  Still coughing with peak flow measurements but 210 today.  Patient requested IV steroids last night and night team ordered solumedrol 125 mg IV.  Will ask for pulm input as patient strongly believes she needs 2 forms of steroids.     Objective: Vital signs in last 24 hours: Filed Vitals:   05/06/13 2144 05/07/13 0010 05/07/13 0440 05/07/13 0443  BP:  149/93 130/95   Pulse:  99 73 75  Temp:  98.6 F (37 C) 98.6 F (37 C)   TempSrc:  Oral Oral   Resp:  27 21 22   Height:      Weight:      SpO2: 100% 100% 100% 98%   Weight change:   Intake/Output Summary (Last 24 hours) at 05/07/13 [id number] Last data filed at 05/06/13 1800  Gross per 24 hour  Intake    480 ml  Output      0 ml  Net    480 ml   PEX General: alert, cooperative, and NAD, very talkative HEENT: NCAT, no scleral icterus Neck: supple, no lymphadenopathy Lungs: relatively good air movement with mild diffuse wheezing (improved since yesterday), normal work of respiration, no rales, ronchi Heart: regular rate and rhythm, no murmurs, gallops, or rubs Abdomen: soft, non-tender, non-distended, normal bowel sounds Extremities: 2+ DP/PT pulses bilaterally, no cyanosis, clubbing, or edema Neurologic: alert & oriented X3, cranial nerves II-XII intact, strength grossly intact, sensation intact to light touch   Lab Results: Basic Metabolic Panel:  Recent Labs Lab 05/05/13 1005  NA 137  K 4.8  CL 101  CO2 22  GLUCOSE 92  BUN 7  CREATININE 0.76  CALCIUM 8.5   CBC:  Recent Labs Lab 05/05/13 1005  WBC 5.3  NEUTROABS 1.7  HGB 10.4*  HCT 33.4*  MCV 76.8*  PLT 418*   Anemia Panel:  Recent Labs Lab 05/06/13 0323  VITAMINB12 474  FOLATE 14.2  FERRITIN 5*  TIBC 356  IRON 14*  RETICCTPCT 1.3   Urine Drug Screen: Drugs of Abuse     Component Value Date/Time   LABOPIA NONE DETECTED 05/05/2013  2215   COCAINSCRNUR NONE DETECTED 05/05/2013 2215   LABBENZ NONE DETECTED 05/05/2013 2215   AMPHETMU NONE DETECTED 05/05/2013 2215   THCU NONE DETECTED 05/05/2013 2215   LABBARB NONE DETECTED 05/05/2013 2215     Micro Results: Recent Results (from the past 240 hour(s))  MRSA PCR SCREENING     Status: None   Collection Time    05/05/13  6:13 PM      Result Value Range Status   MRSA by PCR NEGATIVE  NEGATIVE Final   Comment:            The GeneXpert MRSA Assay (FDA     approved for NASAL specimens     only), is one component of a     comprehensive MRSA colonization     surveillance program. It is not     intended to diagnose MRSA     infection nor to guide or     monitor treatment for     MRSA infections.   Studies/Results: Dg Chest Portable 1 View  05/05/2013   CLINICAL DATA:  Shortness of breath and wheezing  EXAM: PORTABLE CHEST - 1 VIEW  COMPARISON:  None.  FINDINGS: Lungs are mildly hyperexpanded but clear. Heart size and pulmonary vascularity are normal. No adenopathy. No pneumothorax.  No bone lesions.  IMPRESSION: Lungs mildly hyperexpanded but clear.   Electronically Signed   By: [name]   On: 05/05/2013 10:11   Medications: I have reviewed the patient's current medications. Scheduled Meds: . fluticasone  2 spray Each Nare Daily  . guaiFENesin  600 mg Oral BID  . heparin  5,000 Units Subcutaneous Q8H  . ipratropium  2.5 mL Inhalation Q6H  . ipratropium-albuterol  3 mL Nebulization Q4H  . loratadine  10 mg Oral Daily  . mometasone-formoterol  2 puff Inhalation BID  . predniSONE  60 mg Oral Q breakfast  . sodium chloride  3 mL Intravenous Q12H   :  PRN Meds:.sodium chloride, acetaminophen, acetaminophen, albuterol, diphenhydrAMINE, sodium chloride Assessment/Plan: # Asthma exacerbation: 2/2 medication noncompliance x 3 weeks. Still has mild tachypnea and diffuse wheezing but improved from yesterday and breathing comfortably, speaking in full sentences.   Negative CXR (hyperexpanded but clear), afebrile, no leukocytosis.  Calculated peak flow 448, 210 today.  -transfer to med-surg -pulmonary consult, appreciate recs re steroid therapy -Albuterol/ Atrovent nebs q6h scheduled and albuterol neb q6h PRN -continue to prednisone 60 mg PO  -CBGs BID given high dose steroids -tessalon for cough -continue Singulair, Claritin  -check peak flow again tomorrow -RN to ambulate with pulse ox  # Anemia: Hgb 10.4, MCV 76.8. Patient has history of iron deficiency anemia, noncompliant with iron supplementation. She reports heavy menstrual periods.  Ferritin 5 (low), B12 and folate within normal limits.   # Allergic rhinitis: has running nose. S/p of nasal polyps 2011. On Benadryl at home  -continue Benadryl, Flonase   Dispo: Disposition is deferred at this time, awaiting improvement of current medical problems.  Anticipated discharge tomorrow.   The patient does not have a current PCP (No primary provider on file.) and does need an [facility] hospital follow-up appointment after discharge.   .Services Needed at time of discharge: Y = Yes, Blank = No PT:   OT:   RN:   Equipment:   Other:     LOS: 2 days   Rocco Serene, MD 05/07/2013, 8:22 AM

## 2013-05-07 NOTE — Progress Notes (Signed)
05/07/2013 2:08 AM  IMTS Progress Note:  S:  Paged by RN stating pt is unsatisfied with the current treatment plan and does not understand why she is not getting decadron for her asthma which has helped her in the past.  We saw pt and spoke with her regarding her concerns and she reiterated that she is unhappy that she is not getting decadron.  We told her we would discuss her concerns with her primary team.     O:  Vital signs Filed Vitals:   05/07/13 0010  BP: 149/93  Pulse: 99  Temp: 98.6 F (37 C)  Resp: 27   Physical exam Constitutional: Vital signs reviewed.  Pt sitting on the edge of the bed in NAD.   Head: Normocephalic and atraumatic Cardiovascular: RRR, no MRG, pulses symmetric and intact bilaterally Pulmonary/Chest: normal respiratory effort, diffuses wheezes throughout Neurological: A&O x3, cranial nerve II-XII are grossly intact  A/P: #Asthma exacerbation-  -gave solumedrol 125mg  IV  -claritin 10mg  daily -discuss further treatment options with primary team

## 2013-05-07 NOTE — Progress Notes (Signed)
  Date: 05/07/2013  Patient name: Jennings BooksVictoria Blake  Medical record number: [id number]  Date of birth: 08/19/1970   This patient has been seen and the plan of care was discussed with the house staff. Please see their note for complete details. I concur with their findings with the following additions/corrections: This morning she is requesting duplicate therapy with steroids for her asthma (both IV and PO steroid tx). She is clinically improved and was able to perform a peak flow of ~200 L/min.  We explained the appropriate treatment of asthmatic exacerbation which includes either IV or PO steroid tx. At this time, given clinical improvement, decrease nebs to every 6 hours scheduled and if improved change to PRN with addition of her home inhalers in the morning. I would consult pulmonary to discuss with her the appropriate therapy, as she disagrees with us at this time.  Jonah BlueAlejandro Ehren Berisha, DO, FACP Faculty [facility] 05/07/2013, 11:17 AM

## 2013-05-08 DIAGNOSIS — D509 Iron deficiency anemia, unspecified: Secondary | ICD-10-CM

## 2013-05-08 LAB — GLUCOSE, CAPILLARY: Glucose-Capillary: 92 mg/dL (ref 70–99)

## 2013-05-08 MED ORDER — PREDNISONE 10 MG PO TABS: 60.0000 mg | ORAL_TABLET | Freq: Every day | ORAL | Status: DC

## 2013-05-08 MED ORDER — MONTELUKAST SODIUM 10 MG PO TABS: 10.0000 mg | ORAL_TABLET | Freq: Every day | ORAL | Status: DC

## 2013-05-08 MED ORDER — IPRATROPIUM-ALBUTEROL 0.5-2.5 (3) MG/3ML IN SOLN: 3.0000 mL | Freq: Four times a day (QID) | RESPIRATORY_TRACT | Status: DC | PRN

## 2013-05-08 MED ORDER — ALBUTEROL SULFATE HFA 108 (90 BASE) MCG/ACT IN AERS: 2.0000 | INHALATION_SPRAY | Freq: Four times a day (QID) | RESPIRATORY_TRACT | Status: DC | PRN

## 2013-05-08 MED ORDER — LORATADINE 10 MG PO TABS: 10.0000 mg | ORAL_TABLET | Freq: Every day | ORAL | Status: DC

## 2013-05-08 MED ORDER — FLUTICASONE-SALMETEROL 500-50 MCG/DOSE IN AEPB: 1.0000 | INHALATION_SPRAY | Freq: Two times a day (BID) | RESPIRATORY_TRACT | Status: DC

## 2013-05-08 MED ORDER — IPRATROPIUM BROMIDE HFA 17 MCG/ACT IN AERS: 2.0000 | INHALATION_SPRAY | Freq: Four times a day (QID) | RESPIRATORY_TRACT | Status: DC | PRN

## 2013-05-08 NOTE — Progress Notes (Signed)
Patient discharge teaching given, including activity, diet, follow-up appoints, and medications. Patient verbalized understanding of all discharge instructions. IV access was d/c'd. Vitals are stable. Skin is intact except as charted in most recent assessments. Pt to be escorted out by NT, to be driven home by bus.

## 2013-05-08 NOTE — Discharge Summary (Signed)
Name: Janice BooksVictoria Haggart MRN: [id number] DOB: 05/24/1970 43 y.o. PCP: No primary provider on file.  Date of Admission: 05/05/2013  8:48 AM Date of Discharge: 05/08/2013 Attending Physician: Jonah BlueAlejandro Paya, DO  Discharge Diagnosis: Principal Problem:   Asthma exacerbation Active Problems:   Allergic rhinitis   Anemia, iron deficiency  Discharge Medications:   Medication List         albuterol (2.5 MG/3ML) 0.083% nebulizer solution  Commonly known as:  PROVENTIL  Take 2.5 mg by nebulization every 4 (four) hours as needed for wheezing or shortness of breath.     albuterol 108 (90 BASE) MCG/ACT inhaler  Commonly known as:  PROVENTIL HFA;VENTOLIN HFA  Inhale 2 puffs into the lungs every 6 (six) hours as needed for wheezing or shortness of breath.     diphenhydrAMINE 25 MG tablet  Commonly known as:  BENADRYL  Take 25 mg by mouth at bedtime as needed for allergies.     Fluticasone-Salmeterol 500-50 MCG/DOSE Aepb  Commonly known as:  ADVAIR  Inhale 1 puff into the lungs 2 (two) times daily.     ipratropium 17 MCG/ACT inhaler  Commonly known as:  ATROVENT HFA  Inhale 2 puffs into the lungs every 6 (six) hours as needed for wheezing.     loratadine 10 MG tablet  Commonly known as:  CLARITIN  Take 1 tablet (10 mg total) by mouth daily.     montelukast 10 MG tablet  Commonly known as:  SINGULAIR  Take 1 tablet (10 mg total) by mouth at bedtime.     predniSONE 10 MG tablet  Commonly known as:  DELTASONE  Take 6 tablets (60 mg total) by mouth daily with breakfast.        Disposition and follow-up:   [facility].  At the hospital follow up visit please address:  1.  Breathing, medication compliance  2.  Iron supplementation   3.  Labs / imaging needed at time of follow-up: none  4.  Pending labs/ test needing follow-up: none  Follow-up Appointments: Follow-up Information   Follow up with  Janice Blake, Nora, MD On 05/14/2013. (2:45p)    Specialty:  Internal Medicine   Contact information:   [address] [place] [address] [phone]       Follow up with Janice Blake, [name], MD On 05/25/2013. (9:30 AM - [facility]. [address]    Specialty:  Pulmonary Disease   Contact information:   [address] [address] [place] [address] [phone]       Discharge Instructions: Discharge Orders   Future Appointments Provider Department Dept Phone   05/14/2013 2:45 PM Janice BameNora Sadek, MD [facility] [phone]   05/25/2013 9:30 AM Janice Leashouglas B McQuaid, MD [facility] [phone]   Future Orders Complete By Expires   Call MD for:  temperature >100.4  As directed    Diet - low sodium heart healthy  As directed    Increase activity slowly  As directed       Consultations:  pulmonology  Procedures Performed:  Dg Chest Portable 1 View  05/05/2013   CLINICAL DATA:  Shortness of breath and wheezing  EXAM: PORTABLE CHEST - 1 VIEW  COMPARISON:  None.  FINDINGS: Lungs are mildly hyperexpanded but clear. Heart size and pulmonary vascularity are normal. No adenopathy. No pneumothorax. No bone lesions.  IMPRESSION: Lungs mildly hyperexpanded but clear.   Electronically Signed  By: [name]   On: 05/05/2013 10:11    Admission HPI:  Patient is a 43 year old lady with past medical history of asthma, history of intubation due to severe asthma exacerbation (year 2000), anemia, allergic rhinitis, who presents with shortness of breath, wheezing and cough.  Her asthma was diagnosed at age of 68. She was supposed to use albuterol inhaler, albuterol nebulizer, Advair inhaler, and Atrovent inhaler at home. She ran out of her albuterol and Advair inhalers for almost 3 weeks. This is because she could not afford the medications due to lack of insurance. 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 The patient reports that she started having cough and wheezing 2 weeks ago. She has been using Atrovent inhaler only. Her symptoms have been progressively getting worse. Her SOB and wheezing became so severe last night that she felt very tight in her chest, but no chest pain. Her cough is dry without sputum production. She has hx of allergic rhinitis. She has mild runny nose, but no other symptoms for viral infection, such as sore throat, fever, chills or body aching. She did not have sick contacts recently. No history of long distance traveling recently. No pain in calf areas. Patient was treated with continuous albuterol and Atrovent nebulizers in ED, received 1 dose of magnesium sulfate and one dose of Solu-Medrol by IV in ED. Her symptoms improved slightly and could talk in full sentence when I evaluated patient in ED.    Hospital Course by problem list: 1. Asthma exacerbation: Patient presented with shortness of breath, wheezing, and dry cough after 3 weeks of noncompliance with her Advair and Albuterol inhalers due to financial constraints.  She just moved to [place] from [name] and does not yet have a job or insurance.  CXR showed hyperexpanded but clear lungs, and patient was afebrile with no leucocytosis.  She was initially placed on duonebs q2h and Solumedrol 80 mg IV q8h.  Duonebs were gradually titrated down to q6h prn, and she was converted to PO prednisone (starting at 60 mg with 2 week taper); she only required one nebulizer treatment in 24 hours prior to discharge.  On day of discharge, her tachypnea had resolved and though she still had mild diffuse end expiratory wheezing, she was breathing comfortably, speaking in full sentences, and satting 96-98% on room air with ambulation.  Her calculated expected peak flow is 448.  She was not able to successfully complete a peak flow while inpatient secondary to coughing with attempts,  best measured 210.  Case management assisted with all outpatient prescriptions, including Albuterol, Atrovent, and Advair inhalers along with prednisone, Singulair, and Claritin.  [facility] follow-up appointment scheduled for next week, pulmonology in 2 weeks.   2. Chronic anemia: Hgb 10.4, MCV 76.8. Patient has history of iron deficiency anemia, noncompliant with iron supplementation. She reports heavy menstrual periods. Ferritin 5 (low), iron 14 (low), B12 and folate within normal limits. Follow-up outpatient.    Discharge Vitals:   BP 129/84  Pulse 84  Temp(Src) 98.4 F (36.9 C) (Oral)  Resp 18  Ht 5\' 9"  (1.753 m)  Wt 166 lb 3.6 oz (75.4 kg)  BMI 24.54 kg/m2  SpO2 98%  LMP 04/30/2013  Discharge Labs:  Results for orders placed during the hospital encounter of 05/05/13 (from the past 24 hour(s))  GLUCOSE, CAPILLARY     Status: Abnormal   Collection Time  05/07/13  5:58 PM      Result Value Range   Glucose-Capillary 104 (*) 70 - 99 mg/dL  GLUCOSE, CAPILLARY     Status: None   Collection Time    05/08/13  7:55 AM      Result Value Range   Glucose-Capillary 92  70 - 99 mg/dL    Signed: Rocco Serene, MD 05/08/2013, 11:55 AM   Time Spent on Discharge: 40 minutes Services Ordered on Discharge: none Equipment Ordered on Discharge: none

## 2013-05-08 NOTE — Discharge Summary (Signed)
  Date: 05/08/2013  Patient name: Janice Blake  Medical record number: [id number]  Date of birth: 04/25/1970   This patient has been seen and the plan of care was discussed with the house staff. Please see their note for complete details. I concur with their findings and plan.  Jonah BlueAlejandro Duy Lemming, DO, FACP Faculty [facility] 05/08/2013, 2:31 PM

## 2013-05-08 NOTE — Progress Notes (Signed)
Subjective: [name] is doing much better this morning, states she had a better night last night.  Still breathing comfortably, speaking in full sentences. Still coughing with peak flow measurement which was 210 today.  Objective: Vital signs in last 24 hours: Filed Vitals:   05/07/13 2005 05/07/13 2134 05/08/13 0119 05/08/13 0533  BP:  135/90  129/84  Pulse: 80  86 84  Temp:    98.4 F (36.9 C)  TempSrc:    Oral  Resp: 18  18 18   Height:      Weight:      SpO2:    98%   Weight change:   Intake/Output Summary (Last 24 hours) at 05/08/13 0716 Last data filed at 05/07/13 1400  Gross per 24 hour  Intake    240 ml  Output      0 ml  Net    240 ml   PEX General: alert, cooperative, NAD, very talkative HEENT: NCAT, no scleral icterus Neck: supple, no lymphadenopathy Lungs: good air movement with mild diffuse end-expiratory wheezing (improved since yesterday), normal work of respiration, no rales, ronchi Heart: regular rate and rhythm, no murmurs, gallops, or rubs Abdomen: soft, non-tender, non-distended, normal bowel sounds Extremities: 2+ DP/PT pulses bilaterally, no cyanosis, clubbing, or edema Neurologic: alert & oriented X3, cranial nerves II-XII intact, strength grossly intact, sensation intact to light touch   Lab Results: Basic Metabolic Panel:  Recent Labs Lab 05/05/13 1005  NA 137  K 4.8  CL 101  CO2 22  GLUCOSE 92  BUN 7  CREATININE 0.76  CALCIUM 8.5   CBC:  Recent Labs Lab 05/05/13 1005  WBC 5.3  NEUTROABS 1.7  HGB 10.4*  HCT 33.4*  MCV 76.8*  PLT 418*   Anemia Panel:  Recent Labs Lab 05/06/13 0323  VITAMINB12 474  FOLATE 14.2  FERRITIN 5*  TIBC 356  IRON 14*  RETICCTPCT 1.3   Urine Drug Screen: Drugs of Abuse     Component Value Date/Time   LABOPIA NONE DETECTED 05/05/2013 2215   COCAINSCRNUR NONE DETECTED 05/05/2013 2215   LABBENZ NONE DETECTED 05/05/2013 2215   AMPHETMU NONE DETECTED 05/05/2013 2215   THCU NONE DETECTED  05/05/2013 2215   LABBARB NONE DETECTED 05/05/2013 2215     Micro Results: Recent Results (from the past 240 hour(s))  MRSA PCR SCREENING     Status: None   Collection Time    05/05/13  6:13 PM      Result Value Range Status   MRSA by PCR NEGATIVE  NEGATIVE Final   Comment:            The GeneXpert MRSA Assay (FDA     approved for NASAL specimens     only), is one component of a     comprehensive MRSA colonization     surveillance program. It is not     intended to diagnose MRSA     infection nor to guide or     monitor treatment for     MRSA infections.   Medications: I have reviewed the patient's current medications. Scheduled Meds: . fluticasone  2 spray Each Nare Daily  . guaiFENesin  600 mg Oral BID  . heparin  5,000 Units Subcutaneous Q8H  . ipratropium-albuterol  3 mL Nebulization Q6H  . loratadine  10 mg Oral Daily  . mometasone-formoterol  2 puff Inhalation BID  . montelukast  10 mg Oral QHS  . predniSONE  60 mg Oral Q breakfast  .  sodium chloride  3 mL Intravenous Q12H   :  PRN Meds:.sodium chloride, acetaminophen, acetaminophen, albuterol, benzonatate, diphenhydrAMINE, sodium chloride Assessment/Plan: # Asthma exacerbation: 2/2 medication noncompliance x 3 weeks.  Tachypnea resolved, still has mild diffuse end expiratory wheezing but improved from yesterday and breathing comfortably, speaking in full sentences.  Negative CXR (hyperexpanded but clear), afebrile, no leukocytosis.  Calculated peak flow 448, 210 today. -pulmonary consult, appreciate recs; patient should be continued on prednisone taper, restarted on Advair at discharge, pulmonology follow-up arranged on 05/25/13 -Albuterol/Atrovent nebs q6h prn and albuterol neb q6h prn through discharge -continue prednisone 60 mg PO, taper over 2 weeks -continue tessalon for cough -continue Singulair, Claritin, Mucinex -RN ambulated with pulse ox, no home O2 requirement -case management to assist with medications  (6)  # Anemia: Hgb 10.4, MCV 76.8. Patient has history of iron deficiency anemia, noncompliant with iron supplementation. She reports heavy menstrual periods.  Ferritin 5 (low), iron 14 (low), B12 and folate within normal limits.  Follow-up outpatient.  # Allergic rhinitis: History of nasal polyps. On Benadryl prn at home  -continue Benadryl, Flonase   Dispo:  Anticipated discharge today.   The patient does not have a current PCP (No primary provider on file.) and does need an [facility] hospital follow-up appointment after discharge.   .Services Needed at time of discharge: Y = Yes, Blank = No PT:   OT:   RN:   Equipment:   Other:     LOS: 3 days   Rocco Serene, MD 05/08/2013, 7:16 AM

## 2013-05-08 NOTE — Progress Notes (Signed)
  Date: 05/08/2013  Patient name: Janice Blake  Medical record number: [id number]  Date of birth: 12/08/1970   This patient has been seen and the plan of care was discussed with the house staff. Please see their note for complete details. I concur with their findings with the following additions/corrections: Appreciate pulmonary input. She feels better today. No evidence of hypoxia with ambulation. Agree with prednisone taper over 2 weeks. Agree with Advair as outpatient and albuterol as needed. She will need to keep a close eye on her PEF's. She is eager to go home. Given her past exposure hx to work related allergens, she will need outpatient evaluation by pulmonary to consider any further imaging by CT or consideration for bx. At this time, treatment for triggered asthmatic exacerbation is reasonable given her hyperexpanded lungs on CXR, consistent hx of asthma, and improved clinical status. Medically stable for D/C.  Jonah BlueAlejandro Mael Delap, DO, FACP [facility] 05/08/2013, 12:34 PM

## 2013-05-08 NOTE — Discharge Instructions (Signed)
Please take all of your medications as prescribed.  You will be on a prednisone taper.  The case manager is going to work with you to get all of your medicines at a low price.   Don't forget your follow-up appointments with both internal medicine and pulmonology.    Asthma Attack Prevention Although there is no way to prevent asthma from starting, you can take steps to control the disease and reduce its symptoms. Learn about your asthma and how to control it. Take an active role to control your asthma by working with your health care provider to create and follow an asthma action plan. An asthma action plan guides you in:  Taking your medicines properly.  Avoiding things that set off your asthma or make your asthma worse (asthma triggers).  Tracking your level of asthma control.  Responding to worsening asthma.  Seeking emergency care when needed. To track your asthma, keep records of your symptoms, check your peak flow number using a handheld device that shows how well air moves out of your lungs (peak flow meter), and get regular asthma checkups.  WHAT ARE SOME WAYS TO PREVENT AN ASTHMA ATTACK?  Take medicines as directed by your health care provider.  Keep track of your asthma symptoms and level of control.  With your health care provider, write a detailed plan for taking medicines and managing an asthma attack. Then be sure to follow your action plan. Asthma is an ongoing condition that needs regular monitoring and treatment.  Identify and avoid asthma triggers. Many outdoor allergens and irritants (such as pollen, mold, cold air, and air pollution) can trigger asthma attacks. Find out what your asthma triggers are and take steps to avoid them.  Monitor your breathing. Learn to recognize warning signs of an attack, such as coughing, wheezing, or shortness of breath. Your lung function may decrease before you notice any signs or symptoms, so regularly measure and record your peak  airflow with a home peak flow meter.  Identify and treat attacks early. If you act quickly, you are less likely to have a severe attack. You will also need less medicine to control your symptoms. When your peak flow measurements decrease and alert you to an upcoming attack, take your medicine as instructed and immediately stop any activity that may have triggered the attack. If your symptoms do not improve, get medical help.  Pay attention to increasing quick-relief inhaler use. If you find yourself relying on your quick-relief inhaler, your asthma is not under control. See your health care provider about adjusting your treatment. WHAT CAN MAKE MY SYMPTOMS WORSE? A number of common things can set off or make your asthma symptoms worse and cause temporary increased inflammation of your airways. Keep track of your asthma symptoms for several weeks, detailing all the environmental and emotional factors that are linked with your asthma. When you have an asthma attack, go back to your asthma diary to see which factor, or combination of factors, might have contributed to it. Once you know what these factors are, you can take steps to control many of them. If you have allergies and asthma, it is important to take asthma prevention steps at home. Minimizing contact with the substance to which you are allergic will help prevent an asthma attack. Some triggers and ways to avoid these triggers are: Animal Dander:  Some people are allergic to the flakes of skin or dried saliva from animals with fur or feathers.   There is no such  thing as a hypoallergenic dog or cat breed. All dogs or cats can cause allergies, even if they don't shed.  Keep these pets out of your home.  If you are not able to keep a pet outdoors, keep the pet out of your bedroom and other sleeping areas at all times, and keep the door closed.  Remove carpets and furniture covered with cloth from your home. If that is not possible, keep the pet  away from fabric-covered furniture and carpets. Dust Mites: Many people with asthma are allergic to dust mites. Dust mites are tiny bugs that are found in every home in mattresses, pillows, carpets, fabric-covered furniture, bedcovers, clothes, stuffed toys, and other fabric-covered items.   Cover your mattress in a special dust-proof cover.  Cover your pillow in a special dust-proof cover, or wash the pillow each week in hot water. Water must be hotter than 130 F (54.4 C) to kill dust mites. Cold or warm water used with detergent and bleach can also be effective.  Wash the sheets and blankets on your bed each week in hot water.  Try not to sleep or lie on cloth-covered cushions.  Call ahead when traveling and ask for a smoke-free hotel room. Bring your own bedding and pillows in case the hotel only supplies feather pillows and down comforters, which may contain dust mites and cause asthma symptoms.  Remove carpets from your bedroom and those laid on concrete, if you can.  Keep stuffed toys out of the bed, or wash the toys weekly in hot water or cooler water with detergent and bleach. Cockroaches: Many people with asthma are allergic to the droppings and remains of cockroaches.   Keep food and garbage in closed containers. Never leave food out.  Use poison baits, traps, powders, gels, or paste (for example, boric acid).  If a spray is used to kill cockroaches, stay out of the room until the odor goes away. Indoor Mold:  Fix leaky faucets, pipes, or other sources of water that have mold around them.  Clean floors and moldy surfaces with a fungicide or diluted bleach.  Avoid using humidifiers, vaporizers, or swamp coolers. These can spread molds through the air. Pollen and Outdoor Mold:  When pollen or mold spore counts are high, try to keep your windows closed.  Stay indoors with windows closed from late morning to afternoon. Pollen and some mold spore counts are highest at that  time.  Ask your health care provider whether you need to take anti-inflammatory medicine or increase your dose of the medicine before your allergy season starts. Other Irritants to Avoid:  Tobacco smoke is an irritant. If you smoke, ask your health care provider how you can quit. Ask family members to quit smoking too. Do not allow smoking in your home or car.  If possible, do not use a wood-burning stove, kerosene heater, or fireplace. Minimize exposure to all sources of smoke, including to incense, candles, fires, and fireworks.  Try to stay away from strong odors and sprays, such as perfume, talcum powder, hair spray, and paints.  Decrease humidity in your home and use an indoor air cleaning device. Reduce indoor humidity to below 60%. Dehumidifiers or central air conditioners can do this.  Decrease house dust exposure by changing furnace and air cooler filters frequently.  Try to have someone else vacuum for you once or twice a week. Stay out of rooms while they are being vacuumed and for a short while afterward.  If you vacuum,  use a dust mask from a hardware store, a double-layered or microfilter vacuum cleaner bag, or a vacuum cleaner with a HEPA filter.  Sulfites in foods and beverages can be irritants. Do not drink beer or wine or eat dried fruit, processed potatoes, or shrimp if they cause asthma symptoms.  Cold air can trigger an asthma attack. Cover your nose and mouth with a scarf on cold or windy days.  Several health conditions can make asthma more difficult to manage, including a runny nose, sinus infections, reflux disease, psychological stress, and sleep apnea. Work with your health care provider to manage these conditions.  Avoid close contact with people who have a respiratory infection such as a cold or the flu, since your asthma symptoms may get worse if you catch the infection. Wash your hands thoroughly after touching items that may have been handled by people with a  respiratory infection.  Get a flu shot every year to protect against the flu virus, which often makes asthma worse for days or weeks. Also get a pneumonia shot if you have not previously had one. Unlike the flu shot, the pneumonia shot does not need to be given yearly. Medicines:  Talk to your health care provider about whether it is safe for you to take aspirin or non-steroidal anti-inflammatory medicines (NSAIDs). In a small number of people with asthma, aspirin and NSAIDs can cause asthma attacks. These medicines must be avoided by people who have known aspirin-sensitive asthma. It is important that people with aspirin-sensitive asthma read labels of all over-the-counter medicines used to treat pain, colds, coughs, and fever.  Beta blockers and ACE inhibitors are other medicines you should discuss with your health care provider. HOW CAN I FIND OUT WHAT I AM ALLERGIC TO? Ask your asthma health care provider about allergy skin testing or blood testing (the RAST test) to identify the allergens to which you are sensitive. If you are found to have allergies, the most important thing to do is to try to avoid exposure to any allergens that you are sensitive to as much as possible. Other treatments for allergies, such as medicines and allergy shots (immunotherapy) are available.  CAN I EXERCISE? Follow your health care provider's advice regarding asthma treatment before exercising. It is important to maintain a regular exercise program, but vigorous exercise, or exercise in cold, humid, or dry environments can cause asthma attacks, especially for those people who have exercise-induced asthma. Document Released: 03/28/2009 Document Revised: 12/10/2012 Document Reviewed: 10/15/2012 [facility] Patient Information 2014 [place], [place].

## 2013-05-14 ENCOUNTER — Ambulatory Visit: Payer: Self-pay | Admitting: Internal Medicine

## 2013-05-14 ENCOUNTER — Encounter: Payer: Self-pay | Admitting: Internal Medicine

## 2013-05-19 ENCOUNTER — Inpatient Hospital Stay: Payer: Self-pay

## 2013-05-25 ENCOUNTER — Inpatient Hospital Stay: Payer: Self-pay | Admitting: Pulmonary Disease

## 2013-05-28 ENCOUNTER — Encounter: Payer: Self-pay | Admitting: Pulmonary Disease

## 2013-06-30 ENCOUNTER — Encounter: Payer: Self-pay | Admitting: Pulmonary Disease

## 2013-10-07 ENCOUNTER — Ambulatory Visit: Payer: No Typology Code available for payment source | Attending: Internal Medicine

## 2013-10-12 ENCOUNTER — Encounter: Payer: Self-pay | Admitting: Internal Medicine

## 2013-10-12 ENCOUNTER — Ambulatory Visit: Payer: No Typology Code available for payment source | Attending: Internal Medicine | Admitting: Internal Medicine

## 2013-10-12 VITALS — BP 133/88 | HR 75 | Temp 98.7°F | Resp 16 | Ht 69.0 in | Wt 165.0 lb

## 2013-10-12 DIAGNOSIS — J309 Allergic rhinitis, unspecified: Secondary | ICD-10-CM | POA: Insufficient documentation

## 2013-10-12 DIAGNOSIS — J45909 Unspecified asthma, uncomplicated: Secondary | ICD-10-CM | POA: Insufficient documentation

## 2013-10-12 DIAGNOSIS — J4530 Mild persistent asthma, uncomplicated: Secondary | ICD-10-CM

## 2013-10-12 DIAGNOSIS — J3089 Other allergic rhinitis: Secondary | ICD-10-CM

## 2013-10-12 MED ORDER — MONTELUKAST SODIUM 10 MG PO TABS: 10.0000 mg | ORAL_TABLET | Freq: Every day | ORAL | Status: DC

## 2013-10-12 MED ORDER — PREDNISONE 5 MG PO TABS
10.0000 mg | ORAL_TABLET | Freq: Every day | ORAL | Status: DC
Start: 2013-10-20 — End: 2013-12-11

## 2013-10-12 MED ORDER — PREDNISONE 50 MG PO TABS: 50.0000 mg | ORAL_TABLET | Freq: Every day | ORAL | Status: AC

## 2013-10-12 MED ORDER — MOMETASONE FURO-FORMOTEROL FUM 200-5 MCG/ACT IN AERO: 2.0000 | INHALATION_SPRAY | Freq: Two times a day (BID) | RESPIRATORY_TRACT | Status: DC

## 2013-10-12 MED ORDER — ALBUTEROL SULFATE HFA 108 (90 BASE) MCG/ACT IN AERS: 2.0000 | INHALATION_SPRAY | Freq: Four times a day (QID) | RESPIRATORY_TRACT | Status: DC | PRN

## 2013-10-12 NOTE — Progress Notes (Signed)
Pt comes in to establish care for Hx Asthma,Nasal Polys s/p surgery and allergies C/o nasal congestion with sob Pt in process of getting orange card Need med refill on meds Need order for nebulizer machine

## 2013-10-12 NOTE — Progress Notes (Signed)
Patient ID: Janice BooksVictoria Blake, female   DOB: 05/05/1970, 43 y.o.   MRN: [id number]  CC: New patient  HPI: 43 year old female with past medical history of asthma who presented to clinic to establish primary care physician. She reports feeling okay now however with warm weather and allergies she is starting to have more shortness of breath and she ran out of all her asthma medications. She was on multiple different regimens for asthma including Advair, Dulera, QVAR but most recently is only on Dulera and albuterol. She also was taking prednisone although inconsistently for allergies. No chest pain or fevers. No cough.  Allergies  Allergen Reactions  . Amoxicillin Hives and Itching  . Levaquin [Levofloxacin In D5w] Hives and Itching   Past Medical History  Diagnosis Date  . Asthma   . Fracture of right tibia   . Shortness of breath     "just related to the asthma" (05/05/2013)  . Anemia   . Cotton-dust asthma     states that asthma started while she worked in [employer]   Current Outpatient Prescriptions on File Prior to Visit  Medication Sig Dispense Refill  . albuterol (PROVENTIL) (2.5 MG/3ML) 0.083% nebulizer solution Take 2.5 mg by nebulization every 4 (four) hours as needed for wheezing or shortness of breath.      . diphenhydrAMINE (BENADRYL) 25 MG tablet Take 25 mg by mouth at bedtime as needed for allergies.      . Fluticasone-Salmeterol (ADVAIR) 500-50 MCG/DOSE AEPB Inhale 1 puff into the lungs 2 (two) times daily.  60 each  0  . ipratropium (ATROVENT HFA) 17 MCG/ACT inhaler Inhale 2 puffs into the lungs every 6 (six) hours as needed for wheezing.  1 Inhaler  1  . loratadine (CLARITIN) 10 MG tablet Take 1 tablet (10 mg total) by mouth daily.  30 tablet  0   No current facility-administered medications on file prior to visit.   History reviewed. No pertinent family history. History   Social History  . Marital Status: Single    Spouse Name: N/A    Number of Children: N/A  . Years  of Education: N/A   Occupational History  . Not on file.   Social History Main Topics  . Smoking status: Never Smoker   . Smokeless tobacco: Never Used  . Alcohol Use: No  . Drug Use: No  . Sexual Activity: Not Currently   Other Topics Concern  . Not on file   Social History Narrative   [employer]stitute teacher for [employer]    Worked in [employer] x 9 years    Dx'ed Asthma 1999   4 sons (3 college graduates) Oldest age 44. 3/4 sons live in [place]             Review of Systems  Constitutional: Negative for fever, chills, diaphoresis, activity change, appetite change and fatigue.  HENT:  positive for nasal congestion, nasal discharge. No tonsillar exudates.  Eyes: Negative for pain, discharge, redness, itching and visual disturbance.  Respiratory: Negative for cough, choking, chest tightness, shortness of breath, wheezing and stridor.   Cardiovascular: Negative for chest pain, palpitations and leg swelling.  Gastrointestinal: Negative for abdominal distention.  Genitourinary: Negative for dysuria, urgency, frequency, hematuria, flank pain, decreased urine volume, difficulty urinating and dyspareunia.  Musculoskeletal: Negative for back pain, joint swelling, arthralgias and gait problem.  Neurological: Negative for dizziness, tremors, seizures, syncope, facial asymmetry, speech difficulty, weakness, light-headedness, numbness and headaches.  Hematological: Negative for adenopathy. Does not bruise/bleed easily.  Psychiatric/Behavioral: Negative for hallucinations, behavioral problems, confusion, dysphoric mood, decreased concentration and agitation.    Objective:   Filed Vitals:   10/12/13 1423  BP: 133/88  Pulse: 75  Temp: 98.7 F (37.1 C)  Resp: 16    Physical Exam  Constitutional: Appears well-developed and well-nourished. No distress.  HENT: Normocephalic. External right and left ear normal. Oropharynx is clear and moist.  Eyes: Conjunctivae and EOM are normal.  PERRLA, no scleral icterus.  Neck: Normal ROM. Neck supple. No JVD. No tracheal deviation. No thyromegaly.  CVS: RRR, S1/S2 +, no murmurs, no gallops, no carotid bruit.  Pulmonary: No wheezing but diminished breath sounds, no crackles.  Abdominal: Soft. BS +,  no distension, tenderness, rebound or guarding.  Musculoskeletal: Normal range of motion. No edema and no tenderness.  Lymphadenopathy: No lymphadenopathy noted, cervical, inguinal. Neuro: Alert. Normal reflexes, muscle tone coordination. No cranial nerve deficit. Skin: Skin is warm and dry. No rash noted. Not diaphoretic. No erythema. No pallor.  Psychiatric: Normal mood and affect. Behavior, judgment, thought content normal.   Lab Results  Component Value Date   WBC 5.3 05/05/2013   HGB 10.4* 05/05/2013   HCT 33.4* 05/05/2013   MCV 76.8* 05/05/2013   PLT 418* 05/05/2013   Lab Results  Component Value Date   CREATININE 0.76 05/05/2013   BUN 7 05/05/2013   NA 137 05/05/2013   K 4.8 05/05/2013   CL 101 05/05/2013   CO2 22 05/05/2013    No results found for this basename: HGBA1C   Lipid Panel  No results found for this basename: chol, trig, hdl, cholhdl, vldl, ldlcalc       Assessment and plan:   Patient Active Problem List   Diagnosis Date Noted  . Allergic rhinitis 05/05/2013    Priority: Medium - Continue Singulair   . Asthma     Priority: Medium - Prescription provided for Dulera, albuterol inh - Prescription provided for prednisone 50 mg daily for 7 days and once that is completed patient will continue prednisone taper slowly by 5 mg a day and then she was instructed to comment see us in the clinic  - Referral to pulmonary clinic provided        l

## 2013-10-12 NOTE — Patient Instructions (Signed)
Asthma Attack Prevention °Although there is no way to prevent asthma from starting, you can take steps to control the disease and reduce its symptoms. Learn about your asthma and how to control it. Take an active role to control your asthma by working with your health care provider to create and follow an asthma action plan. An asthma action plan guides you in: °· Taking your medicines properly. °· Avoiding things that set off your asthma or make your asthma worse (asthma triggers). °· Tracking your level of asthma control. °· Responding to worsening asthma. °· Seeking emergency care when needed. °To track your asthma, keep records of your symptoms, check your peak flow number using a handheld device that shows how well air moves out of your lungs (peak flow meter), and get regular asthma checkups.  °WHAT ARE SOME WAYS TO PREVENT AN ASTHMA ATTACK? °· Take medicines as directed by your health care provider. °· Keep track of your asthma symptoms and level of control. °· With your health care provider, write a detailed plan for taking medicines and managing an asthma attack. Then be sure to follow your action plan. Asthma is an ongoing condition that needs regular monitoring and treatment. °· Identify and avoid asthma triggers. Many outdoor allergens and irritants (such as pollen, mold, cold air, and air pollution) can trigger asthma attacks. Find out what your asthma triggers are and take steps to avoid them. °· Monitor your breathing. Learn to recognize warning signs of an attack, such as coughing, wheezing, or shortness of breath. Your lung function may decrease before you notice any signs or symptoms, so regularly measure and record your peak airflow with a home peak flow meter. °· Identify and treat attacks early. If you act quickly, you are less likely to have a severe attack. You will also need less medicine to control your symptoms. When your peak flow measurements decrease and alert you to an upcoming attack,  take your medicine as instructed and immediately stop any activity that may have triggered the attack. If your symptoms do not improve, get medical help. °· Pay attention to increasing quick-relief inhaler use. If you find yourself relying on your quick-relief inhaler, your asthma is not under control. See your health care provider about adjusting your treatment. °WHAT CAN MAKE MY SYMPTOMS WORSE? °A number of common things can set off or make your asthma symptoms worse and cause temporary increased inflammation of your airways. Keep track of your asthma symptoms for several weeks, detailing all the environmental and emotional factors that are linked with your asthma. When you have an asthma attack, go back to your asthma diary to see which factor, or combination of factors, might have contributed to it. Once you know what these factors are, you can take steps to control many of them. If you have allergies and asthma, it is important to take asthma prevention steps at home. Minimizing contact with the substance to which you are allergic will help prevent an asthma attack. Some triggers and ways to avoid these triggers are: °Animal Dander:  °Some people are allergic to the flakes of skin or dried saliva from animals with fur or feathers.  °· There is no such thing as a hypoallergenic dog or cat breed. All dogs or cats can cause allergies, even if they don't shed. °· Keep these pets out of your home. °· If you are not able to keep a pet outdoors, keep the pet out of your bedroom and other sleeping areas at all   times, and keep the door closed. °· Remove carpets and furniture covered with cloth from your home. If that is not possible, keep the pet away from fabric-covered furniture and carpets. °Dust Mites: °Many people with asthma are allergic to dust mites. Dust mites are tiny bugs that are found in every home in mattresses, pillows, carpets, fabric-covered furniture, bedcovers, clothes, stuffed toys, and other  fabric-covered items.  °· Cover your mattress in a special dust-proof cover. °· Cover your pillow in a special dust-proof cover, or wash the pillow each week in hot water. Water must be hotter than 130° F (54.4° C) to kill dust mites. Cold or warm water used with detergent and bleach can also be effective. °· Wash the sheets and blankets on your bed each week in hot water. °· Try not to sleep or lie on cloth-covered cushions. °· Call ahead when traveling and ask for a smoke-free hotel room. Bring your own bedding and pillows in case the hotel only supplies feather pillows and down comforters, which may contain dust mites and cause asthma symptoms. °· Remove carpets from your bedroom and those laid on concrete, if you can. °· Keep stuffed toys out of the bed, or wash the toys weekly in hot water or cooler water with detergent and bleach. °Cockroaches: °Many people with asthma are allergic to the droppings and remains of cockroaches.  °· Keep food and garbage in closed containers. Never leave food out. °· Use poison baits, traps, powders, gels, or paste (for example, boric acid). °· If a spray is used to kill cockroaches, stay out of the room until the odor goes away. °Indoor Mold: °· Fix leaky faucets, pipes, or other sources of water that have mold around them. °· Clean floors and moldy surfaces with a fungicide or diluted bleach. °· Avoid using humidifiers, vaporizers, or swamp coolers. These can spread molds through the air. °Pollen and Outdoor Mold: °· When pollen or mold spore counts are high, try to keep your windows closed. °· Stay indoors with windows closed from late morning to afternoon. Pollen and some mold spore counts are highest at that time. °· Ask your health care provider whether you need to take anti-inflammatory medicine or increase your dose of the medicine before your allergy season starts. °Other Irritants to Avoid: °· Tobacco smoke is an irritant. If you smoke, ask your health care provider how  you can quit. Ask family members to quit smoking, too. Do not allow smoking in your home or car. °· If possible, do not use a wood-burning stove, kerosene heater, or fireplace. Minimize exposure to all sources of smoke, including incense, candles, fires, and fireworks. °· Try to stay away from strong odors and sprays, such as perfume, talcum powder, hair spray, and paints. °· Decrease humidity in your home and use an indoor air cleaning device. Reduce indoor humidity to below 60%. Dehumidifiers or central air conditioners can do this. °· Decrease house dust exposure by changing furnace and air cooler filters frequently. °· Try to have someone else vacuum for you once or twice a week. Stay out of rooms while they are being vacuumed and for a short while afterward. °· If you vacuum, use a dust mask from a hardware store, a double-layered or microfilter vacuum cleaner bag, or a vacuum cleaner with a HEPA filter. °· Sulfites in foods and beverages can be irritants. Do not drink beer or wine or eat dried fruit, processed potatoes, or shrimp if they cause asthma symptoms. °· Cold   air can trigger an asthma attack. Cover your nose and mouth with a scarf on cold or windy days. °· Several health conditions can make asthma more difficult to manage, including a runny nose, sinus infections, reflux disease, psychological stress, and sleep apnea. Work with your health care provider to manage these conditions. °· Avoid close contact with people who have a respiratory infection such as a cold or the flu, since your asthma symptoms may get worse if you catch the infection. Wash your hands thoroughly after touching items that may have been handled by people with a respiratory infection. °· Get a flu shot every year to protect against the flu virus, which often makes asthma worse for days or weeks. Also get a pneumonia shot if you have not previously had one. Unlike the flu shot, the pneumonia shot does not need to be given  yearly. °Medicines: °· Talk to your health care provider about whether it is safe for you to take aspirin or non-steroidal anti-inflammatory medicines (NSAIDs). In a small number of people with asthma, aspirin and NSAIDs can cause asthma attacks. These medicines must be avoided by people who have known aspirin-sensitive asthma. It is important that people with aspirin-sensitive asthma read labels of all over-the-counter medicines used to treat pain, colds, coughs, and fever. °· Beta-blockers and ACE inhibitors are other medicines you should discuss with your health care provider. °HOW CAN I FIND OUT WHAT I AM ALLERGIC TO? °Ask your asthma health care provider about allergy skin testing or blood testing (the RAST test) to identify the allergens to which you are sensitive. If you are found to have allergies, the most important thing to do is to try to avoid exposure to any allergens that you are sensitive to as much as possible. Other treatments for allergies, such as medicines and allergy shots (immunotherapy) are available.  °CAN I EXERCISE? °Follow your health care provider's advice regarding asthma treatment before exercising. It is important to maintain a regular exercise program, but vigorous exercise or exercise in cold, humid, or dry environments can cause asthma attacks, especially for those people who have exercise-induced asthma. °Document Released: 03/28/2009 Document Revised: 04/14/2013 Document Reviewed: 10/15/2012 °[employer]® Patient Information ©2015 [employer]. This information is not intended to replace advice given to you by your health care provider. Make sure you discuss any questions you have with your health care provider. ° °Asthma °Asthma is a recurring condition in which the airways tighten and narrow. Asthma can make it difficult to breathe. It can cause coughing, wheezing, and shortness of breath. Asthma episodes, also called asthma attacks, range from minor to life-threatening. Asthma  cannot be cured, but medicines and lifestyle changes can help control it. °CAUSES °Asthma is believed to be caused by inherited (genetic) and environmental factors, but its exact cause is unknown. Asthma may be triggered by allergens, lung infections, or irritants in the air. Asthma triggers are different for each person. Common triggers include:  °· Animal dander. °· Dust mites. °· Cockroaches. °· Pollen from trees or grass. °· Mold. °· Smoke. °· Air pollutants such as dust, household cleaners, hair sprays, aerosol sprays, paint fumes, strong chemicals, or strong odors. °· Cold air, weather changes, and winds (which increase molds and pollens in the air). °· Strong emotional expressions such as crying or laughing hard. °· Stress. °· Certain medicines (such as aspirin) or types of drugs (such as beta-blockers). °· Sulfites in foods and drinks. Foods and drinks that may contain sulfites include dried fruit, potato   chips, and sparkling grape juice. °· Infections or inflammatory conditions such as the flu, a cold, or an inflammation of the nasal membranes (rhinitis). °· Gastroesophageal reflux disease (GERD). °· Exercise or strenuous activity. °SYMPTOMS °Symptoms may occur immediately after asthma is triggered or many hours later. Symptoms include: °· Wheezing. °· Excessive nighttime or early morning coughing. °· Frequent or severe coughing with a common cold. °· Chest tightness. °· Shortness of breath. °DIAGNOSIS  °The diagnosis of asthma is made by a review of your medical history and a physical exam. Tests may also be performed. These may include: °· Lung function studies. These tests show how much air you breathe in and out. °· Allergy tests. °· Imaging tests such as X-rays. °TREATMENT  °Asthma cannot be cured, but it can usually be controlled. Treatment involves identifying and avoiding your asthma triggers. It also involves medicines. There are 2 classes of medicine used for asthma treatment:  °· Controller  medicines. These prevent asthma symptoms from occurring. They are usually taken every day. °· Reliever or rescue medicines. These quickly relieve asthma symptoms. They are used as needed and provide short-term relief. °Your health care provider will help you create an asthma action plan. An asthma action plan is a written plan for managing and treating your asthma attacks. It includes a list of your asthma triggers and how they may be avoided. It also includes information on when medicines should be taken and when their dosage should be changed. An action plan may also involve the use of a device called a peak flow meter. A peak flow meter measures how well the lungs are working. It helps you monitor your condition. °HOME CARE INSTRUCTIONS  °· Take medicine as directed by your health care provider. Speak with your health care provider if you have questions about how or when to take the medicines. °· Use a peak flow meter as directed by your health care provider. Record and keep track of readings. °· Understand and use the action plan to help minimize or stop an asthma attack without needing to seek medical care. °· Control your home environment in the following ways to help prevent asthma attacks: °¨ Do not smoke. Avoid being exposed to secondhand smoke. °¨ Change your heating and air conditioning filter regularly. °¨ Limit your use of fireplaces and wood stoves. °¨ Get rid of pests (such as roaches and mice) and their droppings. °¨ Throw away plants if you see mold on them. °¨ Clean your floors and dust regularly. Use unscented cleaning products. °¨ Try to have someone else vacuum for you regularly. Stay out of rooms while they are being vacuumed and for a short while afterward. If you vacuum, use a dust mask from a hardware store, a double-layered or microfilter vacuum cleaner bag, or a vacuum cleaner with a HEPA filter. °¨ Replace carpet with wood, tile, or vinyl flooring. Carpet can trap dander and dust. °¨ Use  allergy-proof pillows, mattress covers, and box spring covers. °¨ Wash bed sheets and blankets every week in hot water and dry them in a dryer. °¨ Use blankets that are made of polyester or cotton. °¨ Clean bathrooms and kitchens with bleach. If possible, have someone repaint the walls in these rooms with mold-resistant paint. Keep out of the rooms that are being cleaned and painted. °¨ Wash hands frequently. °SEEK MEDICAL CARE IF:  °· You have wheezing, shortness of breath, or a cough even if taking medicine to prevent attacks. °· The colored mucus you   cough up (sputum) is thicker than usual. °· Your sputum changes from clear or white to yellow, green, gray, or bloody. °· You have any problems that may be related to the medicines you are taking (such as a rash, itching, swelling, or trouble breathing). °· You are using a reliever medicine more than 2-3 times per week. °· Your peak flow is still at 50-79% of your personal best after following your action plan for 1 hour. °SEEK IMMEDIATE MEDICAL CARE IF:  °· You seem to be getting worse and are unresponsive to treatment during an asthma attack. °· You are short of breath even at rest. °· You get short of breath when doing very little physical activity. °· You have difficulty eating, drinking, or talking due to asthma symptoms. °· You develop chest pain. °· You develop a fast heartbeat. °· You have a bluish color to your lips or fingernails. °· You are lightheaded, dizzy, or faint. °· Your peak flow is less than 50% of your personal best. °· You have a fever or persistent symptoms for more than 2-3 days. °· You have a fever and symptoms suddenly get worse. °MAKE SURE YOU:  °· Understand these instructions. °· Will watch your condition. °· Will get help right away if you are not doing well or get worse. °Document Released: 04/09/2005 Document Revised: 04/14/2013 Document Reviewed: 11/06/2012 °[employer]® Patient Information ©2015 [employer]. This information is not  intended to replace advice given to you by your health care provider. Make sure you discuss any questions you have with your health care provider. ° °

## 2013-10-16 ENCOUNTER — Ambulatory Visit: Payer: No Typology Code available for payment source | Attending: Internal Medicine

## 2013-11-03 ENCOUNTER — Ambulatory Visit: Payer: Self-pay | Admitting: Internal Medicine

## 2013-11-09 ENCOUNTER — Institutional Professional Consult (permissible substitution): Payer: Self-pay | Admitting: Pulmonary Disease

## 2013-11-30 ENCOUNTER — Ambulatory Visit: Payer: No Typology Code available for payment source | Attending: Internal Medicine | Admitting: Internal Medicine

## 2013-11-30 ENCOUNTER — Encounter: Payer: Self-pay | Admitting: Internal Medicine

## 2013-11-30 VITALS — BP 143/84 | HR 103 | Temp 98.5°F | Resp 16 | Ht 69.0 in | Wt 178.0 lb

## 2013-11-30 DIAGNOSIS — Z8709 Personal history of other diseases of the respiratory system: Secondary | ICD-10-CM | POA: Insufficient documentation

## 2013-11-30 DIAGNOSIS — Z139 Encounter for screening, unspecified: Secondary | ICD-10-CM

## 2013-11-30 DIAGNOSIS — R030 Elevated blood-pressure reading, without diagnosis of hypertension: Secondary | ICD-10-CM

## 2013-11-30 DIAGNOSIS — J3089 Other allergic rhinitis: Secondary | ICD-10-CM

## 2013-11-30 DIAGNOSIS — N920 Excessive and frequent menstruation with regular cycle: Secondary | ICD-10-CM

## 2013-11-30 DIAGNOSIS — J45909 Unspecified asthma, uncomplicated: Secondary | ICD-10-CM | POA: Insufficient documentation

## 2013-11-30 DIAGNOSIS — IMO0001 Reserved for inherently not codable concepts without codable children: Secondary | ICD-10-CM

## 2013-11-30 DIAGNOSIS — J4530 Mild persistent asthma, uncomplicated: Secondary | ICD-10-CM

## 2013-11-30 DIAGNOSIS — Z87898 Personal history of other specified conditions: Secondary | ICD-10-CM

## 2013-11-30 DIAGNOSIS — D509 Iron deficiency anemia, unspecified: Secondary | ICD-10-CM

## 2013-11-30 MED ORDER — FERROUS SULFATE 325 (65 FE) MG PO TABS: 325.0000 mg | ORAL_TABLET | Freq: Three times a day (TID) | ORAL | Status: DC

## 2013-11-30 NOTE — Progress Notes (Signed)
Patient is here following up from asthma issues.  She requests a nebulizer.  She says she does well on prednisone but once she comes off it she goes back to feeling bad.  Patient has experienced some weight gain as well.

## 2013-11-30 NOTE — Progress Notes (Signed)
MRN: [id number] Name: Janice Blake  Sex: female Age: 43 y.o. DOB: 08/06/1970  Allergies: Amoxicillin and Levaquin  Chief Complaint  Patient presents with  . Asthma    HPI: Patient is 43 y.o. female who has history of asthma was seen in our office a few weeks ago, currently patient is taking Dulera as well as albuterol when necessary, she also history of nasal congestion, nasal polyps surgery done in the past she is having recurring symptoms of allergic rhinitis, she's also taking Singulair. Patient has already  scheduled appointment for pulmonologist. Today her blood pressure is elevated denies any headache dizziness chest and shortness of breath, has family history of hypertension, she also reports heavy menstrual periods, previous blood work reviewed noticed low hemoglobin as well as anemia panel consistent with iron deficiency, she does reports craving for ice chips, but she has not been taking iron supplements.  Past Medical History  Diagnosis Date  . Asthma   . Fracture of right tibia   . Shortness of breath     "just related to the asthma" (05/05/2013)  . Anemia   . Cotton-dust asthma     states that asthma started while she worked in [employer]    Past Surgical History  Procedure Laterality Date  . Fracture surgery    . Nasal polyp surgery Bilateral 2011  . Patella fracture surgery Right 1996    "crushed; rod w/12 screws on one side, 6 screws on the other"  . Tubal ligation  1994      Medication List       This list is accurate as of: 11/30/13  3:26 PM.  Always use your most recent med list.               albuterol (2.5 MG/3ML) 0.083% nebulizer solution  Commonly known as:  PROVENTIL  Take 2.5 mg by nebulization every 4 (four) hours as needed for wheezing or shortness of breath.     albuterol 108 (90 BASE) MCG/ACT inhaler  Commonly known as:  PROVENTIL HFA;VENTOLIN HFA  Inhale 2 puffs into the lungs every 6 (six) hours as needed for wheezing or shortness  of breath.     diphenhydrAMINE 25 MG tablet  Commonly known as:  BENADRYL  Take 25 mg by mouth at bedtime as needed for allergies.     ferrous sulfate 325 (65 FE) MG tablet  Commonly known as:  FERROUSUL  Take 1 tablet (325 mg total) by mouth 3 (three) times daily with meals.     ipratropium 17 MCG/ACT inhaler  Commonly known as:  ATROVENT HFA  Inhale 2 puffs into the lungs every 6 (six) hours as needed for wheezing.     loratadine 10 MG tablet  Commonly known as:  CLARITIN  Take 1 tablet (10 mg total) by mouth daily.     mometasone-formoterol 200-5 MCG/ACT Aero  Commonly known as:  DULERA  Inhale 2 puffs into the lungs 2 (two) times daily.     montelukast 10 MG tablet  Commonly known as:  SINGULAIR  Take 1 tablet (10 mg total) by mouth at bedtime.     predniSONE 5 MG tablet  Commonly known as:  DELTASONE  Take 2 tablets (10 mg total) by mouth daily with breakfast.        Meds ordered this encounter  Medications  . ferrous sulfate (FERROUSUL) 325 (65 FE) MG tablet    Sig: Take 1 tablet (325 mg total) by mouth 3 (three) times daily  with meals.    Dispense:  90 tablet    Refill:  3    Immunization History  Administered Date(s) Administered  . Influenza-Unspecified 11/21/2012  . Pneumococcal Polysaccharide-23 05/06/2013    Family History  Problem Relation Age of Onset  . Hypertension Father   . Asthma Son   . Diabetes Maternal Grandmother   . Hypertension Maternal Grandfather   . Cancer Paternal Grandmother   . Cancer Paternal Grandfather     lung cancer     History  Substance Use Topics  . Smoking status: Never Smoker   . Smokeless tobacco: Never Used  . Alcohol Use: No    Review of Systems   As noted in HPI  Filed Vitals:   11/30/13 1455  BP: 143/84  Pulse: 103  Temp: 98.5 F (36.9 C)  Resp: 16    Physical Exam  Physical Exam  Constitutional: No distress.  HENT:  Nasal congestion  No sinus tenderness   Eyes: EOM are normal. Pupils  are equal, round, and reactive to light.  Cardiovascular: Normal rate and regular rhythm.   Pulmonary/Chest: Breath sounds normal. No respiratory distress. She has no wheezes. She has no rales.  Musculoskeletal: She exhibits no edema.    CBC    Component Value Date/Time   WBC 5.3 05/05/2013 1005   RBC 3.97 05/06/2013 0323   RBC 4.35 05/05/2013 1005   HGB 10.4* 05/05/2013 1005   HCT 33.4* 05/05/2013 1005   PLT 418* 05/05/2013 1005   MCV 76.8* 05/05/2013 1005   LYMPHSABS 1.9 05/05/2013 1005   MONOABS 0.5 05/05/2013 1005   EOSABS 1.0* 05/05/2013 1005   BASOSABS 0.1 05/05/2013 1005    CMP     Component Value Date/Time   NA 137 05/05/2013 1005   K 4.8 05/05/2013 1005   CL 101 05/05/2013 1005   CO2 22 05/05/2013 1005   GLUCOSE 92 05/05/2013 1005   BUN 7 05/05/2013 1005   CREATININE 0.76 05/05/2013 1005   CALCIUM 8.5 05/05/2013 1005   GFRNONAA >90 05/05/2013 1005   GFRAA >90 05/05/2013 1005    No results found for this basename: chol,  tri,  ldl    No components found with this basename: hga1c    No results found for this basename: AST    Assessment and Plan  Asthma, mild persistent, uncomplicated Continue with Dulera and albuterol when necessary, patient to follow with her pulmonologist.  Aundra Dubin of nasal polypOther allergic rhinitis - Plan: Ambulatory referral to ENT  Anemia, iron deficiency - Plan: I have started patient on ferrous sulfate (FERROUSUL) 325 (65 FE) MG tablet, also advised to drink plenty of water, increase fiber in her diet to prevent constipation  Elevated BP Advise patient for DASH diet.   Menorrhagia with regular cycle - Plan: Ambulatory referral to Gynecology   Screening - Plan: MM DIGITAL SCREENING BILATERAL   Health Maintenance  -Pap Smear: referred to GYN -Mammogram: ordered    Return in about 3 months (around 03/02/2014).  Doris Cheadle, MD

## 2013-11-30 NOTE — Patient Instructions (Signed)
DASH Eating Plan °DASH stands for "Dietary Approaches to Stop Hypertension." The DASH eating plan is a healthy eating plan that has been shown to reduce high blood pressure (hypertension). Additional health benefits may include reducing the risk of type 2 diabetes mellitus, heart disease, and stroke. The DASH eating plan may also help with weight loss. °WHAT DO I NEED TO KNOW ABOUT THE DASH EATING PLAN? °For the DASH eating plan, you will follow these general guidelines: °· Choose foods with a percent daily value for sodium of less than 5% (as listed on the food label). °· Use salt-free seasonings or herbs instead of table salt or sea salt. °· Check with your health care provider or pharmacist before using salt substitutes. °· Eat lower-sodium products, often labeled as "lower sodium" or "no salt added." °· Eat fresh foods. °· Eat more vegetables, fruits, and low-fat dairy products. °· Choose whole grains. Look for the word "whole" as the first word in the ingredient list. °· Choose fish and skinless chicken or turkey more often than red meat. Limit fish, poultry, and meat to 6 oz (170 g) each day. °· Limit sweets, desserts, sugars, and sugary drinks. °· Choose heart-healthy fats. °· Limit cheese to 1 oz (28 g) per day. °· Eat more home-cooked food and less restaurant, buffet, and fast food. °· Limit fried foods. °· Cook foods using methods other than frying. °· Limit canned vegetables. If you do use them, rinse them well to decrease the sodium. °· When eating at a restaurant, ask that your food be prepared with less salt, or no salt if possible. °WHAT FOODS CAN I EAT? °Seek help from a dietitian for individual calorie needs. °Grains °Whole grain or whole wheat bread. Brown rice. Whole grain or whole wheat pasta. Quinoa, bulgur, and whole grain cereals. Low-sodium cereals. Corn or whole wheat flour tortillas. Whole grain cornbread. Whole grain crackers. Low-sodium crackers. °Vegetables °Fresh or frozen vegetables  (raw, steamed, roasted, or grilled). Low-sodium or reduced-sodium tomato and vegetable juices. Low-sodium or reduced-sodium tomato sauce and paste. Low-sodium or reduced-sodium canned vegetables.  °Fruits °All fresh, canned (in natural juice), or frozen fruits. °Meat and Other Protein Products °Ground beef (85% or leaner), grass-fed beef, or beef trimmed of fat. Skinless chicken or turkey. Ground chicken or turkey. Pork trimmed of fat. All fish and seafood. Eggs. Dried beans, peas, or lentils. Unsalted nuts and seeds. Unsalted canned beans. °Dairy °Low-fat dairy products, such as skim or 1% milk, 2% or reduced-fat cheeses, low-fat ricotta or cottage cheese, or plain low-fat yogurt. Low-sodium or reduced-sodium cheeses. °Fats and Oils °Tub margarines without trans fats. Light or reduced-fat mayonnaise and salad dressings (reduced sodium). Avocado. Safflower, olive, or canola oils. Natural peanut or almond butter. °Other °Unsalted popcorn and pretzels. °The items listed above may not be a complete list of recommended foods or beverages. Contact your dietitian for more options. °WHAT FOODS ARE NOT RECOMMENDED? °Grains °White bread. White pasta. White rice. Refined cornbread. Bagels and croissants. Crackers that contain trans fat. °Vegetables °Creamed or fried vegetables. Vegetables in a cheese sauce. Regular canned vegetables. Regular canned tomato sauce and paste. Regular tomato and vegetable juices. °Fruits °Dried fruits. Canned fruit in light or heavy syrup. Fruit juice. °Meat and Other Protein Products °Fatty cuts of meat. Ribs, chicken wings, bacon, sausage, bologna, salami, chitterlings, fatback, hot dogs, bratwurst, and packaged luncheon meats. Salted nuts and seeds. Canned beans with salt. °Dairy °Whole or 2% milk, cream, half-and-half, and cream cheese. Whole-fat or sweetened yogurt. Full-fat   cheeses or blue cheese. Nondairy creamers and whipped toppings. Processed cheese, cheese spreads, or cheese  curds. °Condiments °Onion and garlic salt, seasoned salt, table salt, and sea salt. Canned and packaged gravies. Worcestershire sauce. Tartar sauce. Barbecue sauce. Teriyaki sauce. Soy sauce, including reduced sodium. Steak sauce. Fish sauce. Oyster sauce. Cocktail sauce. Horseradish. Ketchup and mustard. Meat flavorings and tenderizers. Bouillon cubes. Hot sauce. Tabasco sauce. Marinades. Taco seasonings. Relishes. °Fats and Oils °Butter, stick margarine, lard, shortening, ghee, and bacon fat. Coconut, palm kernel, or palm oils. Regular salad dressings. °Other °Pickles and olives. Salted popcorn and pretzels. °The items listed above may not be a complete list of foods and beverages to avoid. Contact your dietitian for more information. °WHERE CAN I FIND MORE INFORMATION? °National [employer], [employer], [employer]: www.nhlbi.nih.gov/health/health-topics/topics/dash/ °Document Released: 03/29/2011 Document Revised: 08/24/2013 Document Reviewed: 02/11/2013 °ExitCare® Patient Information ©2015 [employer]. This information is not intended to replace advice given to you by your health care provider. Make sure you discuss any questions you have with your health care provider. ° °

## 2013-12-11 ENCOUNTER — Telehealth: Payer: Self-pay | Admitting: Emergency Medicine

## 2013-12-11 ENCOUNTER — Telehealth: Payer: Self-pay | Admitting: Internal Medicine

## 2013-12-11 MED ORDER — PREDNISONE 20 MG PO TABS: 20.0000 mg | ORAL_TABLET | Freq: Every day | ORAL | Status: DC

## 2013-12-11 NOTE — Telephone Encounter (Signed)
Pt req a refill for prednisone for allergy/sinuses. Pt states she recently stopped taking singular and started taking zyrtec. Pt states neither prescriptions are working. Pls contact pt.

## 2013-12-11 NOTE — Telephone Encounter (Signed)
Pt called in c/o sinus infection with possible asthma flare up. Per Dr.Jegede, pt prescribed Prednisone 20 mg tablet to take once daily x 5 dys with f/u OV [facility]

## 2013-12-15 ENCOUNTER — Encounter: Payer: Self-pay | Admitting: Internal Medicine

## 2013-12-15 ENCOUNTER — Ambulatory Visit: Payer: Medicaid Other | Attending: Internal Medicine | Admitting: Internal Medicine

## 2013-12-15 VITALS — BP 139/96 | HR 79 | Temp 98.1°F | Resp 16 | Wt 187.0 lb

## 2013-12-15 DIAGNOSIS — J3089 Other allergic rhinitis: Secondary | ICD-10-CM

## 2013-12-15 DIAGNOSIS — J320 Chronic maxillary sinusitis: Secondary | ICD-10-CM

## 2013-12-15 DIAGNOSIS — J309 Allergic rhinitis, unspecified: Secondary | ICD-10-CM | POA: Diagnosis not present

## 2013-12-15 DIAGNOSIS — J45909 Unspecified asthma, uncomplicated: Secondary | ICD-10-CM | POA: Diagnosis not present

## 2013-12-15 DIAGNOSIS — D649 Anemia, unspecified: Secondary | ICD-10-CM | POA: Insufficient documentation

## 2013-12-15 MED ORDER — PREDNISONE 20 MG PO TABS: 20.0000 mg | ORAL_TABLET | Freq: Every day | ORAL | Status: DC

## 2013-12-15 MED ORDER — SULFAMETHOXAZOLE-TMP DS 800-160 MG PO TABS: 1.0000 | ORAL_TABLET | Freq: Two times a day (BID) | ORAL | Status: DC
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
## 2013-12-15 NOTE — Progress Notes (Signed)
Patient complains of allergies flared up about a week ago Patient states that when this happens she is usually placed on a regiment Of steroids

## 2013-12-24 ENCOUNTER — Ambulatory Visit: Payer: Self-pay

## 2013-12-30 NOTE — Telephone Encounter (Signed)
Spoke with patient, she has an appointment on 9/14 for her allergies. Prednisone is working for her. Per patient it helps help allergies and asthma together.

## 2014-01-04 ENCOUNTER — Ambulatory Visit: Payer: Medicaid Other | Attending: Internal Medicine | Admitting: Internal Medicine

## 2014-01-04 ENCOUNTER — Encounter: Payer: Self-pay | Admitting: Internal Medicine

## 2014-01-04 VITALS — BP 139/87 | HR 80 | Temp 98.0°F | Resp 16 | Wt 190.4 lb

## 2014-01-04 DIAGNOSIS — J309 Allergic rhinitis, unspecified: Secondary | ICD-10-CM | POA: Insufficient documentation

## 2014-01-04 DIAGNOSIS — J329 Chronic sinusitis, unspecified: Secondary | ICD-10-CM | POA: Insufficient documentation

## 2014-01-04 DIAGNOSIS — Z8709 Personal history of other diseases of the respiratory system: Secondary | ICD-10-CM

## 2014-01-04 DIAGNOSIS — J45909 Unspecified asthma, uncomplicated: Secondary | ICD-10-CM | POA: Diagnosis not present

## 2014-01-04 DIAGNOSIS — IMO0002 Reserved for concepts with insufficient information to code with codable children: Secondary | ICD-10-CM | POA: Insufficient documentation

## 2014-01-04 DIAGNOSIS — Z23 Encounter for immunization: Secondary | ICD-10-CM

## 2014-01-04 DIAGNOSIS — D649 Anemia, unspecified: Secondary | ICD-10-CM | POA: Insufficient documentation

## 2014-01-04 DIAGNOSIS — J3089 Other allergic rhinitis: Secondary | ICD-10-CM

## 2014-01-04 DIAGNOSIS — Z87898 Personal history of other specified conditions: Secondary | ICD-10-CM

## 2014-01-04 DIAGNOSIS — J320 Chronic maxillary sinusitis: Secondary | ICD-10-CM

## 2014-01-04 MED ORDER — PREDNISONE 20 MG PO TABS: 10.0000 mg | ORAL_TABLET | Freq: Every day | ORAL | Status: DC
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
## 2014-01-04 NOTE — Progress Notes (Signed)
Patient complains of nasal congestion and stuffy head Patient states this has been going on for about a week and not Getting any better Patient states usually prednisone is prescribed for her when it gets this bad

## 2014-01-21 ENCOUNTER — Ambulatory Visit: Payer: Medicaid Other | Attending: Internal Medicine | Admitting: Internal Medicine

## 2014-01-21 ENCOUNTER — Encounter: Payer: Self-pay | Admitting: Internal Medicine

## 2014-01-21 VITALS — BP 130/90 | HR 70 | Temp 98.1°F | Resp 16 | Wt 197.8 lb

## 2014-01-21 DIAGNOSIS — D649 Anemia, unspecified: Secondary | ICD-10-CM | POA: Diagnosis not present

## 2014-01-21 DIAGNOSIS — J45909 Unspecified asthma, uncomplicated: Secondary | ICD-10-CM | POA: Diagnosis not present

## 2014-01-21 DIAGNOSIS — G8929 Other chronic pain: Secondary | ICD-10-CM | POA: Diagnosis not present

## 2014-01-21 DIAGNOSIS — Z7952 Long term (current) use of systemic steroids: Secondary | ICD-10-CM | POA: Diagnosis not present

## 2014-01-21 DIAGNOSIS — M545 Low back pain, unspecified: Secondary | ICD-10-CM

## 2014-01-21 DIAGNOSIS — M62830 Muscle spasm of back: Secondary | ICD-10-CM | POA: Insufficient documentation

## 2014-01-21 MED ORDER — IBUPROFEN 600 MG PO TABS: 600.0000 mg | ORAL_TABLET | Freq: Three times a day (TID) | ORAL | Status: DC | PRN

## 2014-01-21 MED ORDER — CYCLOBENZAPRINE HCL 10 MG PO TABS: 10.0000 mg | ORAL_TABLET | Freq: Three times a day (TID) | ORAL | Status: DC | PRN
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
## 2014-01-22 DIAGNOSIS — M62830 Muscle spasm of back: Secondary | ICD-10-CM | POA: Insufficient documentation

## 2014-01-27 ENCOUNTER — Telehealth: Payer: Self-pay | Admitting: *Deleted

## 2014-01-27 ENCOUNTER — Encounter: Payer: Self-pay | Admitting: Obstetrics and Gynecology

## 2014-01-27 NOTE — Telephone Encounter (Signed)
Attempted to contact patient, no answer, left message that patient missed appointment and to please call and reschedule. 

## 2014-02-16 ENCOUNTER — Ambulatory Visit: Payer: Medicaid Other | Attending: Internal Medicine | Admitting: Internal Medicine

## 2014-02-16 ENCOUNTER — Encounter: Payer: Self-pay | Admitting: Internal Medicine

## 2014-02-16 VITALS — BP 129/86 | HR 80 | Temp 98.0°F | Resp 17 | Wt 205.8 lb

## 2014-02-16 DIAGNOSIS — J4530 Mild persistent asthma, uncomplicated: Secondary | ICD-10-CM

## 2014-02-16 DIAGNOSIS — J320 Chronic maxillary sinusitis: Secondary | ICD-10-CM

## 2014-02-16 MED ORDER — PREDNISONE 20 MG PO TABS: 10.0000 mg | ORAL_TABLET | Freq: Every day | ORAL | Status: DC

## 2014-02-16 MED ORDER — MONTELUKAST SODIUM 10 MG PO TABS: 10.0000 mg | ORAL_TABLET | Freq: Every day | ORAL | Status: DC

## 2014-02-16 MED ORDER — SULFAMETHOXAZOLE-TMP DS 800-160 MG PO TABS: 1.0000 | ORAL_TABLET | Freq: Two times a day (BID) | ORAL | Status: DC

## 2014-02-16 MED ORDER — MOMETASONE FURO-FORMOTEROL FUM 200-5 MCG/ACT IN AERO: 2.0000 | INHALATION_SPRAY | Freq: Two times a day (BID) | RESPIRATORY_TRACT | Status: DC

## 2014-02-16 NOTE — Progress Notes (Signed)
Patient c/o sinus headache, and nasal congestion  x 3 weeks She is taking benadryl but is not helping.

## 2014-02-16 NOTE — Progress Notes (Signed)
MRN: [id number] Name: Janice Blake  Sex: female Age: 43 y.o. DOB: 04/07/1971  Allergies: Amoxicillin and Levaquin  Chief Complaint  Patient presents with  . Sinusitis  . Nasal Congestion    HPI: Patient is 43 y.o. female who has history of allergic rhinitis chronic sinusitis, asthma comes today reported to have worsening symptoms of nasal congestion postnasal drip sinus pressure for the last few days, as per patient she has scheduled appointment with ENT in 2 weeks currently denies any chest pain or shortness of breath but is requesting refill on her inhalers.   Past Medical History  Diagnosis Date  . Asthma   . Fracture of right tibia   . Shortness of breath     "just related to the asthma" (05/05/2013)  . Anemia   . Cotton-dust asthma     states that asthma started while she worked in [employer]    Past Surgical History  Procedure Laterality Date  . Fracture surgery    . Nasal polyp surgery Bilateral 2011  . Patella fracture surgery Right 1996    "crushed; rod w/12 screws on one side, 6 screws on the other"  . Tubal ligation  1994      Medication List       This list is accurate as of: 02/16/14 11:43 AM.  Always use your most recent med list.               albuterol (2.5 MG/3ML) 0.083% nebulizer solution  Commonly known as:  PROVENTIL  Take 2.5 mg by nebulization every 4 (four) hours as needed for wheezing or shortness of breath.     albuterol 108 (90 BASE) MCG/ACT inhaler  Commonly known as:  PROVENTIL HFA;VENTOLIN HFA  Inhale 2 puffs into the lungs every 6 (six) hours as needed for wheezing or shortness of breath.     cyclobenzaprine 10 MG tablet  Commonly known as:  FLEXERIL  Take 1 tablet (10 mg total) by mouth 3 (three) times daily as needed for muscle spasms.     diphenhydrAMINE 25 MG tablet  Commonly known as:  BENADRYL  Take 25 mg by mouth at bedtime as needed for allergies.     ferrous sulfate 325 (65 FE) MG tablet  Commonly known as:   FERROUSUL  Take 1 tablet (325 mg total) by mouth 3 (three) times daily with meals.     ibuprofen 600 MG tablet  Commonly known as:  ADVIL,MOTRIN  Take 1 tablet (600 mg total) by mouth every 8 (eight) hours as needed.     ipratropium 17 MCG/ACT inhaler  Commonly known as:  ATROVENT HFA  Inhale 2 puffs into the lungs every 6 (six) hours as needed for wheezing.     loratadine 10 MG tablet  Commonly known as:  CLARITIN  Take 1 tablet (10 mg total) by mouth daily.     mometasone-formoterol 200-5 MCG/ACT Aero  Commonly known as:  DULERA  Inhale 2 puffs into the lungs 2 (two) times daily.     montelukast 10 MG tablet  Commonly known as:  SINGULAIR  Take 1 tablet (10 mg total) by mouth at bedtime.     predniSONE 20 MG tablet  Commonly known as:  DELTASONE  Take 0.5 tablets (10 mg total) by mouth daily with breakfast.     sulfamethoxazole-trimethoprim 800-160 MG per tablet  Commonly known as:  BACTRIM DS  Take 1 tablet by mouth 2 (two) times daily.  Meds ordered this encounter  Medications  . sulfamethoxazole-trimethoprim (BACTRIM DS) 800-160 MG per tablet    Sig: Take 1 tablet by mouth 2 (two) times daily.    Dispense:  20 tablet    Refill:  0  . montelukast (SINGULAIR) 10 MG tablet    Sig: Take 1 tablet (10 mg total) by mouth at bedtime.    Dispense:  30 tablet    Refill:  3  . predniSONE (DELTASONE) 20 MG tablet    Sig: Take 0.5 tablets (10 mg total) by mouth daily with breakfast.    Dispense:  10 tablet    Refill:  0  . mometasone-formoterol (DULERA) 200-5 MCG/ACT AERO    Sig: Inhale 2 puffs into the lungs 2 (two) times daily.    Dispense:  1 Inhaler    Refill:  5    Immunization History  Administered Date(s) Administered  . Influenza,inj,Quad PF,36+ Mos 01/04/2014  . Influenza-Unspecified 11/21/2012  . Pneumococcal Polysaccharide-23 05/06/2013    Family History  Problem Relation Age of Onset  . Hypertension Father   . Asthma Son   . Diabetes Maternal  Grandmother   . Hypertension Maternal Grandfather   . Cancer Paternal Grandmother   . Cancer Paternal Grandfather     lung cancer     History  Substance Use Topics  . Smoking status: Never Smoker   . Smokeless tobacco: Never Used  . Alcohol Use: No    Review of Systems   As noted in HPI  Filed Vitals:   02/16/14 1123  BP: 129/86  Pulse: 80  Temp: 98 F (36.7 C)  Resp: 17    Physical Exam  Physical Exam  HENT:  Nasal congestion, sinus tenderness   Eyes: EOM are normal. Pupils are equal, round, and reactive to light.  Cardiovascular: Normal rate and regular rhythm.   Pulmonary/Chest: No respiratory distress. She has no wheezes. She has no rales.  Musculoskeletal: She exhibits no edema.    CBC    Component Value Date/Time   WBC 5.3 05/05/2013 1005   RBC 3.97 05/06/2013 0323   RBC 4.35 05/05/2013 1005   HGB 10.4* 05/05/2013 1005   HCT 33.4* 05/05/2013 1005   PLT 418* 05/05/2013 1005   MCV 76.8* 05/05/2013 1005   LYMPHSABS 1.9 05/05/2013 1005   MONOABS 0.5 05/05/2013 1005   EOSABS 1.0* 05/05/2013 1005   BASOSABS 0.1 05/05/2013 1005    CMP     Component Value Date/Time   NA 137 05/05/2013 1005   K 4.8 05/05/2013 1005   CL 101 05/05/2013 1005   CO2 22 05/05/2013 1005   GLUCOSE 92 05/05/2013 1005   BUN 7 05/05/2013 1005   CREATININE 0.76 05/05/2013 1005   CALCIUM 8.5 05/05/2013 1005   GFRNONAA >90 05/05/2013 1005   GFRAA >90 05/05/2013 1005    No results found for this basename: chol, tri, ldl    No components found with this basename: hga1c    No results found for this basename: AST    Assessment and Plan  Maxillary sinusitis, unspecified chronicity - Plan: sulfamethoxazole-trimethoprim (BACTRIM DS) 800-160 MG per tablet, patient is scheduled to see ENT specialist.  Asthma, mild persistent, uncomplicated - Plan: montelukast (SINGULAIR) 10 MG tablet, predniSONE (DELTASONE) 20 MG tablet, mometasone-formoterol (DULERA) 200-5 MCG/ACT AERO  Return in about 3 months  (around 05/19/2014) for asthma.  Doris CheadleADVANI, Raygan Skarda, MD

## 2014-04-08 ENCOUNTER — Ambulatory Visit (HOSPITAL_COMMUNITY): Payer: Medicaid Other | Attending: Internal Medicine

## 2014-04-19 ENCOUNTER — Encounter: Payer: Medicaid Other | Admitting: Internal Medicine

## 2014-05-16 IMAGING — CR DG CHEST 1V PORT
1 series · 1 of 1 positions shown · non-contrast
Comparison: None.

CLINICAL DATA: Shortness of breath and wheezing

EXAM:
PORTABLE CHEST - 1 VIEW

[AP]
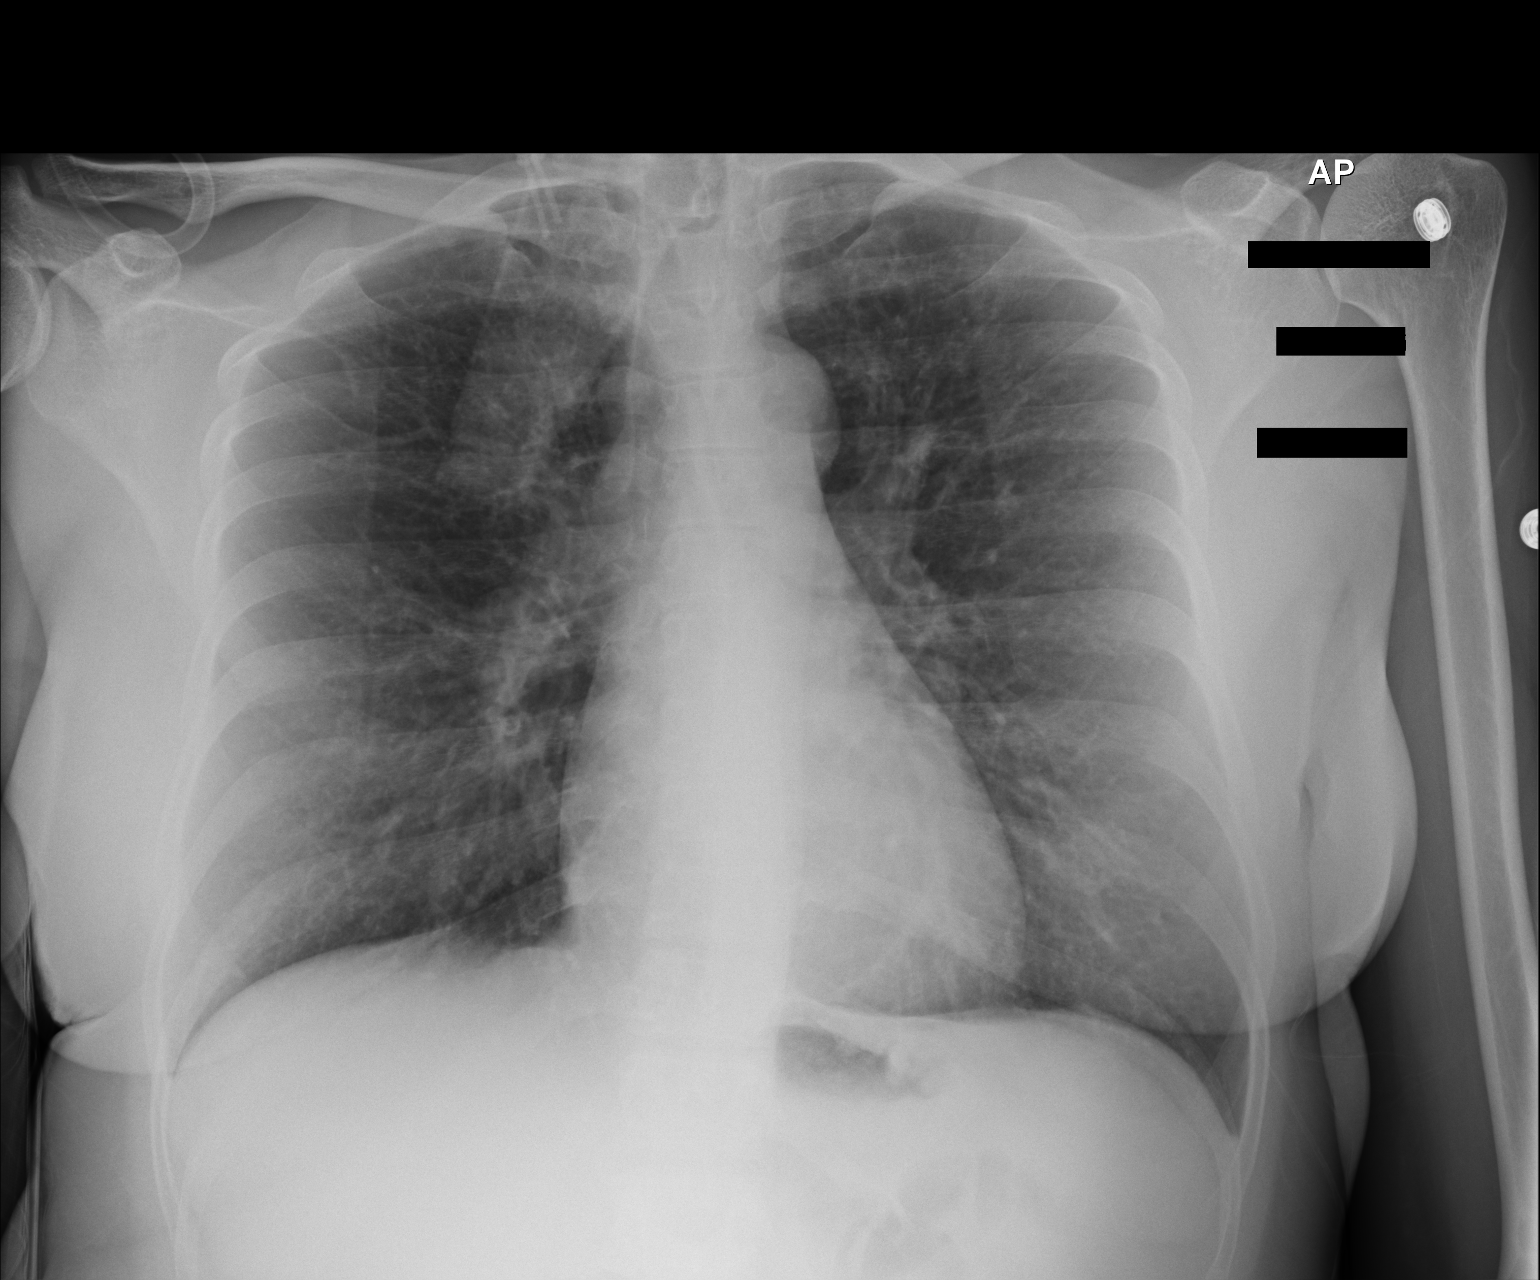

[1 of 1 positions shown; findings below may reference images not displayed]

FINDINGS: Lungs are mildly hyperexpanded but clear. Heart size and pulmonary
vascularity are normal. No adenopathy. No pneumothorax. No bone
lesions.
IMPRESSION: Lungs mildly hyperexpanded but clear.

## 2014-07-23 ENCOUNTER — Encounter: Payer: Self-pay | Admitting: Internal Medicine

## 2014-07-23 ENCOUNTER — Telehealth: Payer: Self-pay

## 2014-07-23 ENCOUNTER — Ambulatory Visit: Payer: Medicaid Other | Attending: Internal Medicine | Admitting: Internal Medicine

## 2014-07-23 VITALS — BP 120/79 | HR 81 | Temp 97.9°F | Resp 16 | Wt 223.8 lb

## 2014-07-23 DIAGNOSIS — M79672 Pain in left foot: Secondary | ICD-10-CM | POA: Diagnosis not present

## 2014-07-23 DIAGNOSIS — J4530 Mild persistent asthma, uncomplicated: Secondary | ICD-10-CM

## 2014-07-23 LAB — URIC ACID: URIC ACID, SERUM: 4.8 mg/dL (ref 2.4–7.0)

## 2014-07-23 MED ORDER — IBUPROFEN 800 MG PO TABS: 800.0000 mg | ORAL_TABLET | Freq: Three times a day (TID) | ORAL | Status: DC | PRN

## 2014-07-23 MED ORDER — LORATADINE 10 MG PO TABS: 10.0000 mg | ORAL_TABLET | Freq: Every day | ORAL | Status: DC

## 2014-07-23 MED ORDER — PREDNISONE 20 MG PO TABS: 10.0000 mg | ORAL_TABLET | Freq: Every day | ORAL | Status: DC

## 2014-07-23 NOTE — Progress Notes (Signed)
MRN: [id number] Name: Janice Blake  Sex: female Age: 44 y.o. DOB: 02/03/1971  Allergies: Amoxicillin and Levaquin  Chief Complaint  Patient presents with  . Foot Pain    left    HPI: Patient is 44 y.o. female who comes today complaining of left foot pain and swelling for the last 3 weeks, she denies any fall or trauma, patient is concerned if she has gout, she reports family history of gout, she is taking Tylenol with minimum improvement, denies any fever chills denies any recent travel denies any bug bites.  Past Medical History  Diagnosis Date  . Asthma   . Fracture of right tibia   . Shortness of breath     "just related to the asthma" (05/05/2013)  . Anemia   . Cotton-dust asthma     states that asthma started while she worked in [employer]    Past Surgical History  Procedure Laterality Date  . Fracture surgery    . Nasal polyp surgery Bilateral 2011  . Patella fracture surgery Right 1996    "crushed; rod w/12 screws on one side, 6 screws on the other"  . Tubal ligation  1994      Medication List       This list is accurate as of: 07/23/14 10:40 AM.  Always use your most recent med list.               albuterol (2.5 MG/3ML) 0.083% nebulizer solution  Commonly known as:  PROVENTIL  Take 2.5 mg by nebulization every 4 (four) hours as needed for wheezing or shortness of breath.     albuterol 108 (90 BASE) MCG/ACT inhaler  Commonly known as:  PROVENTIL HFA;VENTOLIN HFA  Inhale 2 puffs into the lungs every 6 (six) hours as needed for wheezing or shortness of breath.     cyclobenzaprine 10 MG tablet  Commonly known as:  FLEXERIL  Take 1 tablet (10 mg total) by mouth 3 (three) times daily as needed for muscle spasms.     diphenhydrAMINE 25 MG tablet  Commonly known as:  BENADRYL  Take 25 mg by mouth at bedtime as needed for allergies.     ferrous sulfate 325 (65 FE) MG tablet  Commonly known as:  FERROUSUL  Take 1 tablet (325 mg total) by mouth 3 (three)  times daily with meals.     ibuprofen 800 MG tablet  Commonly known as:  ADVIL,MOTRIN  Take 1 tablet (800 mg total) by mouth every 8 (eight) hours as needed.     ipratropium 17 MCG/ACT inhaler  Commonly known as:  ATROVENT HFA  Inhale 2 puffs into the lungs every 6 (six) hours as needed for wheezing.     loratadine 10 MG tablet  Commonly known as:  CLARITIN  Take 1 tablet (10 mg total) by mouth daily.     mometasone-formoterol 200-5 MCG/ACT Aero  Commonly known as:  DULERA  Inhale 2 puffs into the lungs 2 (two) times daily.     montelukast 10 MG tablet  Commonly known as:  SINGULAIR  Take 1 tablet (10 mg total) by mouth at bedtime.     predniSONE 20 MG tablet  Commonly known as:  DELTASONE  Take 0.5 tablets (10 mg total) by mouth daily with breakfast.     sulfamethoxazole-trimethoprim 800-160 MG per tablet  Commonly known as:  BACTRIM DS  Take 1 tablet by mouth 2 (two) times daily.        Meds ordered this  encounter  Medications  . ibuprofen (ADVIL,MOTRIN) 800 MG tablet    Sig: Take 1 tablet (800 mg total) by mouth every 8 (eight) hours as needed.    Dispense:  60 tablet    Refill:  0  . loratadine (CLARITIN) 10 MG tablet    Sig: Take 1 tablet (10 mg total) by mouth daily.    Dispense:  30 tablet    Refill:  0    Immunization History  Administered Date(s) Administered  . Influenza,inj,Quad PF,36+ Mos 01/04/2014  . Influenza-Unspecified 11/21/2012  . Pneumococcal Polysaccharide-23 05/06/2013    Family History  Problem Relation Age of Onset  . Hypertension Father   . Asthma Son   . Diabetes Maternal Grandmother   . Hypertension Maternal Grandfather   . Cancer Paternal Grandmother   . Cancer Paternal Grandfather     lung cancer     History  Substance Use Topics  . Smoking status: Never Smoker   . Smokeless tobacco: Never Used  . Alcohol Use: No    Review of Systems   As noted in HPI  Filed Vitals:   07/23/14 0929  BP: 120/79  Pulse: 81    Temp: 97.9 F (36.6 C)  Resp: 16    Physical Exam  Physical Exam  Eyes: EOM are normal. Pupils are equal, round, and reactive to light.  Cardiovascular: Normal rate and regular rhythm.   Pulmonary/Chest: Breath sounds normal. No respiratory distress. She has no wheezes. She has no rales.  Musculoskeletal:  Left foot erythema/warmth on dorsal aspect, minimal swelling at the first MTP joint.good range of motion at the ankle    CBC    Component Value Date/Time   WBC 5.3 05/05/2013 1005   RBC 3.97 05/06/2013 0323   RBC 4.35 05/05/2013 1005   HGB 10.4* 05/05/2013 1005   HCT 33.4* 05/05/2013 1005   PLT 418* 05/05/2013 1005   MCV 76.8* 05/05/2013 1005   LYMPHSABS 1.9 05/05/2013 1005   MONOABS 0.5 05/05/2013 1005   EOSABS 1.0* 05/05/2013 1005   BASOSABS 0.1 05/05/2013 1005    CMP     Component Value Date/Time   NA 137 05/05/2013 1005   K 4.8 05/05/2013 1005   CL 101 05/05/2013 1005   CO2 22 05/05/2013 1005   GLUCOSE 92 05/05/2013 1005   BUN 7 05/05/2013 1005   CREATININE 0.76 05/05/2013 1005   CALCIUM 8.5 05/05/2013 1005   GFRNONAA >90 05/05/2013 1005   GFRAA >90 05/05/2013 1005    No results found for: CHOL  No components found for: HGA1C  No results found for: AST  Assessment and Plan  Left foot pain - Plan: will check Uric acid level, ordered DG Foot Complete Left,prescribed ibuprofen (ADVIL,MOTRIN) 800 MG tablet     Return in about 3 months (around 10/22/2014), or if symptoms worsen or fail to improve.   This note has been created with Education officer, environmental. Any transcriptional errors are unintentional.    Doris Cheadle, MD

## 2014-07-23 NOTE — Progress Notes (Signed)
Patient complains of having pain to the top of her left  Foot and toes that started about three weeks ago Patient states she thinks it may be gout but never had this happen to her before

## 2014-07-23 NOTE — Telephone Encounter (Signed)
Called patient to try to find out why she is requesting a refill on her prednisone Patient states she needs this for her allergies and its the only thing that works Per Dr we will send new prescription for pharmacy

## 2014-07-26 ENCOUNTER — Telehealth: Payer: Self-pay

## 2014-07-26 NOTE — Telephone Encounter (Signed)
Patient is aware of her lab results 

## 2014-07-26 NOTE — Telephone Encounter (Signed)
-----   Message from Doris Cheadleeepak Advani, MD sent at 07/26/2014  9:16 AM EDT ----- Call and let the patient know that her uric acid is in normal range.

## 2014-08-13 ENCOUNTER — Other Ambulatory Visit: Payer: Self-pay | Admitting: *Deleted

## 2014-08-13 DIAGNOSIS — J4530 Mild persistent asthma, uncomplicated: Secondary | ICD-10-CM

## 2014-08-13 MED ORDER — MONTELUKAST SODIUM 10 MG PO TABS: 10.0000 mg | ORAL_TABLET | Freq: Every day | ORAL | Status: DC

## 2014-08-13 MED ORDER — PREDNISONE 20 MG PO TABS: 10.0000 mg | ORAL_TABLET | Freq: Every day | ORAL | Status: DC

## 2014-08-13 NOTE — Telephone Encounter (Signed)
Patient requesting refill on Singulair and prednisone for her allergies.  Will refill Singulair but need PCP approval to refill prednisone for allergies

## 2014-09-08 ENCOUNTER — Telehealth: Payer: Self-pay | Admitting: *Deleted

## 2014-09-08 ENCOUNTER — Telehealth: Payer: Self-pay | Admitting: Internal Medicine

## 2014-09-08 NOTE — Telephone Encounter (Signed)
Patient is calling to request a med refill for Prednisone, patient states that she has bad allergies and would like a refill. Please f/u with pt.

## 2014-09-08 NOTE — Telephone Encounter (Signed)
Patient asking for refill of prednisone for her seasonal allergies.  Routing to PCP for decision.

## 2014-10-04 ENCOUNTER — Other Ambulatory Visit: Payer: Self-pay | Admitting: Internal Medicine

## 2014-10-26 ENCOUNTER — Ambulatory Visit: Payer: Self-pay | Admitting: Otolaryngology

## 2014-10-26 NOTE — H&P (Signed)
  Assessment  Nasal polyps (471.9) (J33.9). Asthma (493.90) (J45.909). Orders  PredniSONE 10 MG Oral Tablet;Take 4 tablets daily for 3 days, 3 tablets daily for 3 days, 2 tablets daily for 3 days, one tablet daily for 3 days, one half tablet daily; Qty32; R0; Rx. Allergy Consult; Requested for: 12 Oct 2014. Discussed  Persistent polypoid disease, severe nasal obstruction with anosmia. She is only well relatively when she is on prednisone. On exam, she has a hyponasal voice. There is polypoid mass filling both nasal cavities. Oral cavity and pharynx are clear. Recommend consultation with an allergist. Recommend revision endoscopic sinus and polyp surgery. We'll treat with prednisone now and 5 days prior to surgery. Reason For Visit  Nasal polyps. Allergies  Amoxicillin TABS Levaquin TABS. Current Meds  Claritin 10 MG Oral Tablet;; RPT Flonase 50 MCG/ACT SUSP (Fluticasone Propionate);; RPT Fluticasone Propionate 50 MCG/ACT Nasal Suspension;USE 2 SPRAYS IN EACH NOSTRIL TWICE DAILY.; Rx Dulera 200-5 MCG/ACT Inhalation Aerosol;; RPT Montelukast Sodium 10 MG Oral Tablet;; RPT. Active Problems  Asthma   (493.90) (J45.909) Nasal polyps   (471.9) (J33.9). PSH  Knee Surgery Leg Repair Sinus Surgery. Family Hx  Family history of hypertension: Father (V17.49) (Z82.49) Family history of malignant neoplasm (V16.9) (Z80.9). Personal Hx  Never a smoker. Signature  Electronically signed by : Serena ColonelJefry  Quinesha Selinger  M.D.; 10/12/2014 3:45 PM EST.

## 2014-10-29 ENCOUNTER — Encounter (HOSPITAL_BASED_OUTPATIENT_CLINIC_OR_DEPARTMENT_OTHER): Payer: Self-pay | Admitting: *Deleted

## 2014-11-02 ENCOUNTER — Encounter (HOSPITAL_BASED_OUTPATIENT_CLINIC_OR_DEPARTMENT_OTHER): Payer: Self-pay | Admitting: *Deleted

## 2014-11-03 ENCOUNTER — Encounter (HOSPITAL_BASED_OUTPATIENT_CLINIC_OR_DEPARTMENT_OTHER): Payer: Self-pay | Admitting: *Deleted

## 2014-11-03 ENCOUNTER — Ambulatory Visit (HOSPITAL_BASED_OUTPATIENT_CLINIC_OR_DEPARTMENT_OTHER)
Admission: RE | Admit: 2014-11-03 | Discharge: 2014-11-03 | Disposition: A | Discharge status: Home / Self Care | Payer: Medicaid Other | Source: Ambulatory Visit | Attending: Otolaryngology | Admitting: Otolaryngology

## 2014-11-03 ENCOUNTER — Ambulatory Visit (HOSPITAL_BASED_OUTPATIENT_CLINIC_OR_DEPARTMENT_OTHER): Payer: Medicaid Other | Admitting: Certified Registered"

## 2014-11-03 ENCOUNTER — Encounter (HOSPITAL_BASED_OUTPATIENT_CLINIC_OR_DEPARTMENT_OTHER)
Admission: RE | Disposition: A | Discharge status: Home / Self Care | Payer: Self-pay | Source: Ambulatory Visit | Attending: Otolaryngology

## 2014-11-03 DIAGNOSIS — D649 Anemia, unspecified: Secondary | ICD-10-CM | POA: Diagnosis not present

## 2014-11-03 DIAGNOSIS — Z881 Allergy status to other antibiotic agents status: Secondary | ICD-10-CM | POA: Diagnosis not present

## 2014-11-03 DIAGNOSIS — J338 Other polyp of sinus: Secondary | ICD-10-CM | POA: Insufficient documentation

## 2014-11-03 DIAGNOSIS — Z888 Allergy status to other drugs, medicaments and biological substances status: Secondary | ICD-10-CM | POA: Diagnosis not present

## 2014-11-03 DIAGNOSIS — J45909 Unspecified asthma, uncomplicated: Secondary | ICD-10-CM | POA: Insufficient documentation

## 2014-11-03 DIAGNOSIS — J339 Nasal polyp, unspecified: Secondary | ICD-10-CM | POA: Diagnosis present

## 2014-11-03 HISTORY — PX: NASAL SINUS SURGERY: SHX719

## 2014-11-03 HISTORY — DX: Nasal polyp, unspecified: J33.9

## 2014-11-03 SURGERY — SINUS SURGERY, ENDOSCOPIC
Anesthesia: General | Site: Nose | Laterality: Bilateral

## 2014-11-03 MED ORDER — PROMETHAZINE HCL 25 MG RE SUPP: 25.0000 mg | Freq: Four times a day (QID) | RECTAL | Status: DC | PRN

## 2014-11-03 MED ORDER — FENTANYL CITRATE (PF) 100 MCG/2ML IJ SOLN
50.0000 ug | INTRAMUSCULAR | Status: AC | PRN
  Administered 2014-11-03: 25 ug via INTRAVENOUS
  Administered 2014-11-03: 100 ug via INTRAVENOUS
  Administered 2014-11-03: 50 ug via INTRAVENOUS

## 2014-11-03 MED ORDER — ONDANSETRON HCL 4 MG/2ML IJ SOLN
INTRAMUSCULAR | Status: DC | PRN
  Administered 2014-11-03: 4 mg via INTRAVENOUS

## 2014-11-03 MED ORDER — CLINDAMYCIN PHOSPHATE 900 MG/50ML IV SOLN
900.0000 mg | INTRAVENOUS | Status: AC
  Administered 2014-11-03: 900 mg via INTRAVENOUS

## 2014-11-03 MED ORDER — OXYMETAZOLINE HCL 0.05 % NA SOLN
NASAL | Status: AC
  Filled 2014-11-03: qty 15

## 2014-11-03 MED ORDER — LIDOCAINE-EPINEPHRINE 1 %-1:100000 IJ SOLN
INTRAMUSCULAR | Status: DC | PRN
  Administered 2014-11-03: 2 mL

## 2014-11-03 MED ORDER — MIDAZOLAM HCL 2 MG/2ML IJ SOLN
1.0000 mg | INTRAMUSCULAR | Status: DC | PRN
  Administered 2014-11-03: 2 mg via INTRAVENOUS

## 2014-11-03 MED ORDER — HYDROMORPHONE HCL 1 MG/ML IJ SOLN
INTRAMUSCULAR | Status: AC
  Filled 2014-11-03: qty 1

## 2014-11-03 MED ORDER — PROPOFOL 10 MG/ML IV BOLUS
INTRAVENOUS | Status: DC | PRN
  Administered 2014-11-03: 200 mg via INTRAVENOUS

## 2014-11-03 MED ORDER — HYDROCODONE-ACETAMINOPHEN 7.5-325 MG PO TABS: 1.0000 | ORAL_TABLET | Freq: Four times a day (QID) | ORAL | Status: DC | PRN

## 2014-11-03 MED ORDER — LIDOCAINE-EPINEPHRINE 1 %-1:100000 IJ SOLN
INTRAMUSCULAR | Status: AC
  Filled 2014-11-03: qty 1

## 2014-11-03 MED ORDER — MEPERIDINE HCL 25 MG/ML IJ SOLN: 6.2500 mg | INTRAMUSCULAR | Status: DC | PRN

## 2014-11-03 MED ORDER — HYDROMORPHONE HCL 1 MG/ML IJ SOLN
0.2500 mg | INTRAMUSCULAR | Status: DC | PRN
  Administered 2014-11-03 (×4): 0.5 mg via INTRAVENOUS

## 2014-11-03 MED ORDER — CLINDAMYCIN HCL 300 MG PO CAPS: 300.0000 mg | ORAL_CAPSULE | Freq: Three times a day (TID) | ORAL | Status: DC

## 2014-11-03 MED ORDER — GLYCOPYRROLATE 0.2 MG/ML IJ SOLN
0.2000 mg | Freq: Once | INTRAMUSCULAR | Status: AC | PRN
  Administered 2014-11-03: 0.2 mg via INTRAVENOUS

## 2014-11-03 MED ORDER — OXYMETAZOLINE HCL 0.05 % NA SOLN
NASAL | Status: DC | PRN
  Administered 2014-11-03: 1

## 2014-11-03 MED ORDER — SCOPOLAMINE 1 MG/3DAYS TD PT72: 1.0000 | MEDICATED_PATCH | Freq: Once | TRANSDERMAL | Status: DC | PRN

## 2014-11-03 MED ORDER — BACIT-POLY-NEO HC 1 % EX OINT
TOPICAL_OINTMENT | CUTANEOUS | Status: AC
  Filled 2014-11-03: qty 15

## 2014-11-03 MED ORDER — PROMETHAZINE HCL 25 MG/ML IJ SOLN
6.2500 mg | INTRAMUSCULAR | Status: DC | PRN
Start: 2014-11-03 — End: 2014-11-03

## 2014-11-03 MED ORDER — CLINDAMYCIN PHOSPHATE 900 MG/50ML IV SOLN
INTRAVENOUS | Status: AC
  Filled 2014-11-03: qty 50

## 2014-11-03 MED ORDER — SUCCINYLCHOLINE CHLORIDE 20 MG/ML IJ SOLN
INTRAMUSCULAR | Status: DC | PRN
  Administered 2014-11-03: 100 mg via INTRAVENOUS

## 2014-11-03 MED ORDER — DEXAMETHASONE SODIUM PHOSPHATE 4 MG/ML IJ SOLN
INTRAMUSCULAR | Status: DC | PRN
  Administered 2014-11-03: 10 mg via INTRAVENOUS

## 2014-11-03 MED ORDER — MIDAZOLAM HCL 2 MG/2ML IJ SOLN
INTRAMUSCULAR | Status: AC
  Filled 2014-11-03: qty 2

## 2014-11-03 MED ORDER — FENTANYL CITRATE (PF) 100 MCG/2ML IJ SOLN
INTRAMUSCULAR | Status: AC
  Filled 2014-11-03: qty 6

## 2014-11-03 MED ORDER — OXYMETAZOLINE HCL 0.05 % NA SOLN
2.0000 | NASAL | Status: DC
  Administered 2014-11-03: 2 via NASAL

## 2014-11-03 MED ORDER — CIPROFLOXACIN-DEXAMETHASONE 0.3-0.1 % OT SUSP
OTIC | Status: AC
  Filled 2014-11-03: qty 7.5

## 2014-11-03 MED ORDER — LACTATED RINGERS IV SOLN
INTRAVENOUS | Status: DC
  Administered 2014-11-03 (×2): via INTRAVENOUS

## 2014-11-03 SURGICAL SUPPLY — 42 items
ATTRACTOMAT 16X20 MAGNETIC DRP (DRAPES) ×3 IMPLANT
BLADE RAD40 ROTATE 4M 4 5PK (BLADE) IMPLANT
BLADE RAD40 ROTATE 4M 4MM 5PK (BLADE)
BLADE RAD60 ROTATE M4 4 5PK (BLADE) ×2 IMPLANT
BLADE RAD60 ROTATE M4 4MM 5PK (BLADE) ×1
BLADE TRICUT ROTATE M4 4 5PK (BLADE) ×2 IMPLANT
BLADE TRICUT ROTATE M4 4MM 5PK (BLADE) ×1
BUR HS RAD FRONTAL 3 (BURR) IMPLANT
BUR HS RAD FRONTAL 3MM (BURR)
CANISTER SUC SOCK COL 7IN (MISCELLANEOUS) IMPLANT
CANISTER SUCT 1200ML W/VALVE (MISCELLANEOUS) ×3 IMPLANT
CORDS BIPOLAR (ELECTRODE) IMPLANT
DECANTER SPIKE VIAL GLASS SM (MISCELLANEOUS) IMPLANT
DRESSING ADAPTIC 1/2  N-ADH (PACKING) IMPLANT
DRESSING NASAL KENNEDY 3.5X.9 (MISCELLANEOUS) IMPLANT
DRSG NASAL KENNEDY 3.5X.9 (MISCELLANEOUS)
DRSG NASAL KENNEDY LMNT 8CM (GAUZE/BANDAGES/DRESSINGS) IMPLANT
DRSG NASOPORE 8CM (GAUZE/BANDAGES/DRESSINGS) ×3 IMPLANT
DRSG TELFA 3X8 NADH (GAUZE/BANDAGES/DRESSINGS) IMPLANT
GAUZE SPONGE 4X4 16PLY XRAY LF (GAUZE/BANDAGES/DRESSINGS) IMPLANT
GAUZE VASELINE FOILPK 1/2 X 72 (GAUZE/BANDAGES/DRESSINGS) IMPLANT
GLOVE ECLIPSE 7.5 STRL STRAW (GLOVE) ×3 IMPLANT
GOWN STRL REUS W/ TWL LRG LVL3 (GOWN DISPOSABLE) ×2 IMPLANT
GOWN STRL REUS W/TWL LRG LVL3 (GOWN DISPOSABLE) ×4
HEMOSTAT SURGICEL .5X2 ABSORB (HEMOSTASIS) IMPLANT
IV NS 500ML (IV SOLUTION) ×2
IV NS 500ML BAXH (IV SOLUTION) ×1 IMPLANT
NEEDLE PRECISIONGLIDE 27X1.5 (NEEDLE) ×3 IMPLANT
NEEDLE SPNL 25GX3.5 QUINCKE BL (NEEDLE) ×3 IMPLANT
NS IRRIG 1000ML POUR BTL (IV SOLUTION) ×3 IMPLANT
PACK BASIN DAY SURGERY FS (CUSTOM PROCEDURE TRAY) ×3 IMPLANT
PACK ENT DAY SURGERY (CUSTOM PROCEDURE TRAY) ×3 IMPLANT
PATTIES SURGICAL .5 X3 (DISPOSABLE) ×3 IMPLANT
SLEEVE SCD COMPRESS KNEE MED (MISCELLANEOUS) ×3 IMPLANT
SOLUTION BUTLER CLEAR DIP (MISCELLANEOUS) ×3 IMPLANT
SPONGE GAUZE 2X2 8PLY STER LF (GAUZE/BANDAGES/DRESSINGS) ×1
SPONGE GAUZE 2X2 8PLY STRL LF (GAUZE/BANDAGES/DRESSINGS) ×2 IMPLANT
SPONGE SURGIFOAM ABS GEL 12-7 (HEMOSTASIS) IMPLANT
TOWEL OR 17X24 6PK STRL BLUE (TOWEL DISPOSABLE) ×6 IMPLANT
TUBE CONNECTING 20'X1/4 (TUBING) ×1
TUBE CONNECTING 20X1/4 (TUBING) ×2 IMPLANT
YANKAUER SUCT BULB TIP NO VENT (SUCTIONS) ×3 IMPLANT

## 2014-11-03 NOTE — Op Note (Signed)
OPERATIVE REPORT  DATE OF SURGERY: 11/03/2014  PATIENT:  Janice Blake,  44 y.o. female  PRE-OPERATIVE DIAGNOSIS:  NASAL POLYPS   POST-OPERATIVE DIAGNOSIS:  NASAL POLYPS   PROCEDURE:  Procedure(s): BILATERAL ENDOSCOPIC POLYPECTOMY ETHMOIDECTOMY MAXILLARY ANTROSTOMY   SURGEON:  Susy FrizzleJefry H Cashawn Yanko, MD  ASSISTANTS: None  ANESTHESIA:   General   EBL:  100 ml  DRAINS: None  LOCAL MEDICATIONS USED:  1% Xylocaine with epinephrine  SPECIMEN:  Bilateral nasal and sinus contents  COUNTS:  Correct  PROCEDURE DETAILS: The patient was taken to the operating room and placed on the operating table in the supine position. Following induction of general endotracheal anesthesia, the face was draped in a standard fashion. Afrin spray was used preoperatively in the nasal cavities. 1% Xylocaine with epinephrine was infiltrated into the polypoid mass bilaterally. Afrin pledgets were used on the left side during the procedure. 1. Bilateral extensive endoscopic nasal polypectomy/ethmoidectomy. Using a combination of 0 and 30 nasal endoscopes and the microdebrider a complete polypectomy was performed. The middle meatus, superior meatus and ethmoid cavities were completely filled with polypoid tissue. This was all debrided. This was followed into the maxillary sinus bilaterally. The maxillary opening on the right was very small. The fovea and the lamina papyracea were intact. Frontal recess was dissected of polypoid disease as well. The right middle turbinate remnant was lateralized and very weak and was removed. The uncinate was still present on both sides and was removed. Part of the ground lamella was still present on the left and was removed. Bilateral ethmoid cavities were packed with NasalPore at the termination of the procedure. 2. Right maxillary antrostomy. After the polypoid disease and the uncinate were removed from the right infundibular area of the maxillary antrostomy was enlarged anteriorly using  up-biting forceps and posteriorly using the microdebrider. There is polypoid disease present in both maxillary sinuses which were cleaned out using the microdebrider. 3. Bilateral endoscopic frontal sinusotomy. After the frontal recess was cleaned of polypoid disease bilaterally the frontal duct was then cleaned using a 60 angled debrider. The polypoid disease extending all the way up into the lower part of the sinus and was all cleaned out until there was a nice opening.  The pharynx was suctioned of blood and secretions. Packing was placed in the ethmoid cavities as described above. Patient was awakened, extubated and transferred to recovery in stable condition.    PATIENT DISPOSITION:  To PACU, stable

## 2014-11-03 NOTE — Interval H&P Note (Signed)
History and Physical Interval Note:  11/03/2014 12:08 PM  Janice BooksVictoria Blake  has presented today for surgery, with the diagnosis of NASAL POLYPS   The various methods of treatment have been discussed with the patient and family. After consideration of risks, benefits and other options for treatment, the patient has consented to  Procedure(s): BILATERAL ENDOSCOPIC POLYPECTOMY ETHMOIDECTOMY MAXILLARY ANTROSTOMY  (Bilateral) as a surgical intervention .  The patient's history has been reviewed, patient examined, no change in status, stable for surgery.  I have reviewed the patient's chart and labs.  Questions were answered to the patient's satisfaction.     Lorenzo Pereyra

## 2014-11-03 NOTE — Anesthesia Preprocedure Evaluation (Signed)
Anesthesia Evaluation  Patient identified by MRN, date of birth, ID band Patient awake    Reviewed: Allergy & Precautions, NPO status , Patient's Chart, lab work & pertinent test results  Airway Mallampati: II  TM Distance: >3 FB Neck ROM: Full    Dental no notable dental hx.    Pulmonary shortness of breath, asthma ,  breath sounds clear to auscultation  Pulmonary exam normal       Cardiovascular negative cardio ROS Normal cardiovascular examRhythm:Regular Rate:Normal     Neuro/Psych negative neurological ROS  negative psych ROS   GI/Hepatic negative GI ROS, Neg liver ROS,   Endo/Other  negative endocrine ROS  Renal/GU negative Renal ROS     Musculoskeletal negative musculoskeletal ROS (+)   Abdominal   Peds  Hematology  (+) anemia ,   Anesthesia Other Findings   Reproductive/Obstetrics negative OB ROS                             Anesthesia Physical Anesthesia Plan  ASA: II  Anesthesia Plan: General   Post-op Pain Management:    Induction: Intravenous  Airway Management Planned: Oral ETT  Additional Equipment:   Intra-op Plan:   Post-operative Plan: Extubation in OR  Informed Consent: I have reviewed the patients History and Physical, chart, labs and discussed the procedure including the risks, benefits and alternatives for the proposed anesthesia with the patient or authorized representative who has indicated his/her understanding and acceptance.   Dental advisory given  Plan Discussed with: CRNA  Anesthesia Plan Comments:         Anesthesia Quick Evaluation

## 2014-11-03 NOTE — Discharge Instructions (Signed)
Start using nasal saline spray, every hour while awake.   Post Anesthesia Home Care Instructions  Activity: Get plenty of rest for the remainder of the day. A responsible adult should stay with you for 24 hours following the procedure.  For the next 24 hours, DO NOT: -Drive a car -Advertising copywriterperate machinery -Drink alcoholic beverages -Take any medication unless instructed by your physician -Make any legal decisions or sign important papers.  Meals: Start with liquid foods such as gelatin or soup. Progress to regular foods as tolerated. Avoid greasy, spicy, heavy foods. If nausea and/or vomiting occur, drink only clear liquids until the nausea and/or vomiting subsides. Call your physician if vomiting continues.  Special Instructions/Symptoms: Your throat may feel dry or sore from the anesthesia or the breathing tube placed in your throat during surgery. If this causes discomfort, gargle with warm salt water. The discomfort should disappear within 24 hours.  If you had a scopolamine patch placed behind your ear for the management of post- operative nausea and/or vomiting:  1. The medication in the patch is effective for 72 hours, after which it should be removed.  Wrap patch in a tissue and discard in the trash. Wash hands thoroughly with soap and water. 2. You may remove the patch earlier than 72 hours if you experience unpleasant side effects which may include dry mouth, dizziness or visual disturbances. 3. Avoid touching the patch. Wash your hands with soap and water after contact with the patch.

## 2014-11-03 NOTE — Transfer of Care (Signed)
Immediate Anesthesia Transfer of Care Note  Patient: Janice Blake  Procedure(s) Performed: Procedure(s): BILATERAL ENDOSCOPIC POLYPECTOMY ETHMOIDECTOMY MAXILLARY ANTROSTOMY  (Bilateral)  Patient Location: PACU  Anesthesia Type:General  Level of Consciousness: awake, alert , oriented and patient cooperative  Airway & Oxygen Therapy: Patient Spontanous Breathing and Patient connected to face mask oxygen  Post-op Assessment: Report given to RN and Post -op Vital signs reviewed and stable  Post vital signs: Reviewed and stable  Last Vitals:  Filed Vitals:   11/03/14 1328  BP:   Pulse: 102  Temp:   Resp: 19    Complications: No apparent anesthesia complications

## 2014-11-03 NOTE — Anesthesia Postprocedure Evaluation (Signed)
Anesthesia Post Note  Patient: Janice BooksVictoria Blake  Procedure(s) Performed: Procedure(s) (LRB): BILATERAL ENDOSCOPIC POLYPECTOMY ETHMOIDECTOMY MAXILLARY ANTROSTOMY  (Bilateral)  Anesthesia type: General  Patient location: PACU  Post pain: Pain level controlled  Post assessment: Post-op Vital signs reviewed  Last Vitals: BP 121/74 mmHg  Pulse 43  Temp(Src) 36.8 C (Oral)  Resp 23  Ht 5\' 9"  (1.753 m)  Wt 216 lb 4 oz (98.09 kg)  BMI 31.92 kg/m2  SpO2 97%  LMP 10/22/2014  Post vital signs: Reviewed  Level of consciousness: sedated  Complications: No apparent anesthesia complications

## 2014-11-03 NOTE — H&P (View-Only) (Signed)
  Assessment  Nasal polyps (471.9) (J33.9). Asthma (493.90) (J45.909). Orders  PredniSONE 10 MG Oral Tablet;Take 4 tablets daily for 3 days, 3 tablets daily for 3 days, 2 tablets daily for 3 days, one tablet daily for 3 days, one half tablet daily; Qty32; R0; Rx. Allergy Consult; Requested for: 12 Oct 2014. Discussed  Persistent polypoid disease, severe nasal obstruction with anosmia. She is only well relatively when she is on prednisone. On exam, she has a hyponasal voice. There is polypoid mass filling both nasal cavities. Oral cavity and pharynx are clear. Recommend consultation with an allergist. Recommend revision endoscopic sinus and polyp surgery. We'll treat with prednisone now and 5 days prior to surgery. Reason For Visit  Nasal polyps. Allergies  Amoxicillin TABS Levaquin TABS. Current Meds  Claritin 10 MG Oral Tablet;; RPT Flonase 50 MCG/ACT SUSP (Fluticasone Propionate);; RPT Fluticasone Propionate 50 MCG/ACT Nasal Suspension;USE 2 SPRAYS IN EACH NOSTRIL TWICE DAILY.; Rx Dulera 200-5 MCG/ACT Inhalation Aerosol;; RPT Montelukast Sodium 10 MG Oral Tablet;; RPT. Active Problems  Asthma   (493.90) (J45.909) Nasal polyps   (471.9) (J33.9). PSH  Knee Surgery Leg Repair Sinus Surgery. Family Hx  Family history of hypertension: Father (V17.49) (Z82.49) Family history of malignant neoplasm (V16.9) (Z80.9). Personal Hx  Never a smoker. Signature  Electronically signed by : Serena ColonelJefry  Nathaneal Sommers  M.D.; 10/12/2014 3:45 PM EST.

## 2014-11-03 NOTE — Anesthesia Procedure Notes (Signed)
Procedure Name: Intubation Date/Time: 11/03/2014 12:19 PM Performed by: Gar GibbonKEETON, Abra Lingenfelter S Pre-anesthesia Checklist: Patient identified, Emergency Drugs available, Suction available and Patient being monitored Patient Re-evaluated:Patient Re-evaluated prior to inductionOxygen Delivery Method: Circle System Utilized Preoxygenation: Pre-oxygenation with 100% oxygen Intubation Type: IV induction Ventilation: Mask ventilation without difficulty Laryngoscope Size: Mac and 3 Grade View: Grade I Tube type: Oral Tube size: 7.0 mm Number of attempts: 1 Airway Equipment and Method: Stylet and Oral airway Placement Confirmation: ETT inserted through vocal cords under direct vision,  positive ETCO2 and breath sounds checked- equal and bilateral Secured at: 21 cm Tube secured with: Tape Dental Injury: Teeth and Oropharynx as per pre-operative assessment

## 2014-11-04 ENCOUNTER — Encounter (HOSPITAL_BASED_OUTPATIENT_CLINIC_OR_DEPARTMENT_OTHER): Payer: Self-pay | Admitting: Otolaryngology

## 2014-12-11 ENCOUNTER — Other Ambulatory Visit: Payer: Self-pay | Admitting: Internal Medicine

## 2014-12-13 ENCOUNTER — Telehealth: Payer: Self-pay

## 2014-12-13 DIAGNOSIS — J4530 Mild persistent asthma, uncomplicated: Secondary | ICD-10-CM

## 2014-12-13 NOTE — Telephone Encounter (Signed)
Patient called requesting refills on her asthma medications Refilled her inhaler nebulizer solution and singulair-sent to [facility] on file Patient also requesting a prescription for an at home nebulizer Patient will pick up prescription for nebulizer here at the office

## 2014-12-13 NOTE — Telephone Encounter (Signed)
Patient called to request a med refill for montelukast (SINGULAIR) 10 MG tablet , albuterol solution for machine, and she would also like a Rx for an albuterol solution machine. Please f/u

## 2014-12-14 ENCOUNTER — Telehealth: Payer: Self-pay

## 2014-12-14 DIAGNOSIS — R0602 Shortness of breath: Secondary | ICD-10-CM

## 2014-12-14 NOTE — Telephone Encounter (Signed)
Returned patient phone call Patient not available Left message on voice mail to return our call 

## 2014-12-14 NOTE — Telephone Encounter (Signed)
Attempted to call patient \ Patient  Not available Left message on voice mail to return our call

## 2014-12-16 ENCOUNTER — Other Ambulatory Visit: Payer: Self-pay

## 2014-12-16 DIAGNOSIS — J4530 Mild persistent asthma, uncomplicated: Secondary | ICD-10-CM

## 2014-12-16 MED ORDER — ALBUTEROL SULFATE (2.5 MG/3ML) 0.083% IN NEBU: 2.5000 mg | INHALATION_SOLUTION | RESPIRATORY_TRACT | Status: DC | PRN

## 2014-12-16 MED ORDER — MONTELUKAST SODIUM 10 MG PO TABS: 10.0000 mg | ORAL_TABLET | Freq: Every day | ORAL | Status: DC

## 2014-12-16 MED ORDER — ALBUTEROL SULFATE HFA 108 (90 BASE) MCG/ACT IN AERS: 2.0000 | INHALATION_SPRAY | Freq: Four times a day (QID) | RESPIRATORY_TRACT | Status: DC | PRN

## 2015-03-15 ENCOUNTER — Other Ambulatory Visit: Payer: Self-pay | Admitting: Internal Medicine

## 2015-03-15 DIAGNOSIS — J4530 Mild persistent asthma, uncomplicated: Secondary | ICD-10-CM

## 2015-03-15 MED ORDER — MONTELUKAST SODIUM 10 MG PO TABS: 10.0000 mg | ORAL_TABLET | Freq: Every day | ORAL | Status: DC

## 2015-03-15 NOTE — Telephone Encounter (Signed)
Nurse called patient, reached voicemail. Left message for patient to call [facility], at [phone]. Nurse [facility]. Nurse will [facility]. Patient needs appointment for additional refills.

## 2015-05-04 ENCOUNTER — Telehealth: Payer: Self-pay | Admitting: Internal Medicine

## 2015-05-04 NOTE — Telephone Encounter (Signed)
Pt. Called requesting a med refill on predniSONE (DELTASONE) 20 MG tablet. Please f/u with pt.

## 2015-05-17 ENCOUNTER — Telehealth: Payer: Self-pay | Admitting: Internal Medicine

## 2015-05-17 DIAGNOSIS — J4530 Mild persistent asthma, uncomplicated: Secondary | ICD-10-CM

## 2015-05-17 NOTE — Telephone Encounter (Signed)
Pt is calling and reports that she will not be able to make it to her appt on Friday 05/20/15 with Dr. Armen Pickup to est care and to have a refill on medication for her allergies, due to new employment and the inability to get off of work in time. Pt expresses difficulty with getting an appt because of her scheduling conflicts with work. She states that she lost her last job because of her allergies. Pt is asking for a refill on Singulair. I expressed to the pt that as she has not seen this provider yet, then there is quite the possibility that she will not be able to have this refilled at this time. Sadie Reynolds, ASA

## 2015-05-18 ENCOUNTER — Telehealth: Payer: Self-pay

## 2015-05-18 MED ORDER — MONTELUKAST SODIUM 10 MG PO TABS: 10.0000 mg | ORAL_TABLET | Freq: Every day | ORAL | Status: DC

## 2015-05-18 NOTE — Telephone Encounter (Signed)
Spoke with patient and she is aware i gave her a thirty supply with no refills Patient needs to have an appointment for future refills

## 2015-05-18 NOTE — Telephone Encounter (Signed)
Pt. Called requesting a med refill on montelukast (SINGULAIR) 10 MG tablet. Please f/u with pt.

## 2015-05-19 NOTE — Telephone Encounter (Signed)
Singulair filled by Dr. Hyman Hopes

## 2015-05-20 ENCOUNTER — Ambulatory Visit: Payer: Medicaid Other | Admitting: Family Medicine

## 2015-07-01 ENCOUNTER — Encounter: Payer: Self-pay | Admitting: Family Medicine

## 2015-07-01 ENCOUNTER — Ambulatory Visit: Payer: Medicaid Other | Attending: Family Medicine | Admitting: Family Medicine

## 2015-07-01 VITALS — BP 119/80 | HR 71 | Temp 98.4°F | Resp 16 | Ht 69.0 in | Wt 211.0 lb

## 2015-07-01 DIAGNOSIS — Z7951 Long term (current) use of inhaled steroids: Secondary | ICD-10-CM | POA: Diagnosis not present

## 2015-07-01 DIAGNOSIS — J4530 Mild persistent asthma, uncomplicated: Secondary | ICD-10-CM | POA: Diagnosis not present

## 2015-07-01 DIAGNOSIS — J45909 Unspecified asthma, uncomplicated: Secondary | ICD-10-CM | POA: Insufficient documentation

## 2015-07-01 DIAGNOSIS — Z79899 Other long term (current) drug therapy: Secondary | ICD-10-CM | POA: Insufficient documentation

## 2015-07-01 MED ORDER — MONTELUKAST SODIUM 10 MG PO TABS: 10.0000 mg | ORAL_TABLET | Freq: Every day | ORAL | Status: DC

## 2015-07-01 MED ORDER — ALBUTEROL SULFATE (2.5 MG/3ML) 0.083% IN NEBU: 2.5000 mg | INHALATION_SOLUTION | RESPIRATORY_TRACT | Status: DC | PRN

## 2015-07-01 MED ORDER — MOMETASONE FURO-FORMOTEROL FUM 200-5 MCG/ACT IN AERO: 2.0000 | INHALATION_SPRAY | Freq: Two times a day (BID) | RESPIRATORY_TRACT | Status: DC

## 2015-07-01 MED ORDER — CETIRIZINE HCL 10 MG PO TABS: 10.0000 mg | ORAL_TABLET | Freq: Every day | ORAL | Status: DC

## 2015-07-01 MED ORDER — FLUTICASONE PROPIONATE 50 MCG/ACT NA SUSP: 2.0000 | Freq: Every day | NASAL | Status: DC

## 2015-07-01 MED ORDER — ALBUTEROL SULFATE HFA 108 (90 BASE) MCG/ACT IN AERS: 2.0000 | INHALATION_SPRAY | Freq: Four times a day (QID) | RESPIRATORY_TRACT | Status: DC | PRN

## 2015-07-01 NOTE — Patient Instructions (Signed)
Janice Blake was seen today for asthma.  Diagnoses and all orders for this visit:  Asthma, mild persistent, uncomplicated -     albuterol (PROVENTIL HFA;VENTOLIN HFA) 108 (90 Base) MCG/ACT inhaler; Inhale 2 puffs into the lungs every 6 (six) hours as needed for wheezing or shortness of breath. -     albuterol (PROVENTIL) (2.5 MG/3ML) 0.083% nebulizer solution; Take 3 mLs (2.5 mg total) by nebulization every 4 (four) hours as needed for wheezing or shortness of breath. -     mometasone-formoterol (DULERA) 200-5 MCG/ACT AERO; Inhale 2 puffs into the lungs 2 (two) times daily. -     montelukast (SINGULAIR) 10 MG tablet; Take 1 tablet (10 mg total) by mouth at bedtime. -     cetirizine (ZYRTEC) 10 MG tablet; Take 1 tablet (10 mg total) by mouth daily. -     fluticasone (FLONASE) 50 MCG/ACT nasal spray; Place 2 sprays into both nostrils daily. -     Ambulatory referral to Pulmonology    Schedule pulmonology appt with Dr. Delford FieldWright in 2-3 weeks  F/u with me for pap smear in 6 weeks   Dr. Armen PickupFunches

## 2015-07-01 NOTE — Progress Notes (Signed)
Subjective:  Patient ID: Janice Blake, female    DOB: May 30, 1970  Age: 45 y.o. MRN: [id number]  CC: Asthma   HPI Janice Blake presents for   1. Asthma: she has been out of albuterol, dulera and singulair for two weeks. She takes prednisone that she buys online. She last had prednisone in 10/2014. She request prednisone noting that when she cannot smell she knows a flare is pending. She denise CP. She has SOB at night. No cough. No GERD. She is s/p nasal polyp removal surgery x 2.   Social History  Substance Use Topics  . Smoking status: Never Smoker   . Smokeless tobacco: Never Used  . Alcohol Use: No    Outpatient Prescriptions Prior to Visit  Medication Sig Dispense Refill  . albuterol (PROVENTIL HFA;VENTOLIN HFA) 108 (90 BASE) MCG/ACT inhaler Inhale 2 puffs into the lungs every 6 (six) hours as needed for wheezing or shortness of breath. 1 Inhaler 5  . albuterol (PROVENTIL) (2.5 MG/3ML) 0.083% nebulizer solution Take 3 mLs (2.5 mg total) by nebulization every 4 (four) hours as needed for wheezing or shortness of breath. 75 mL 2  . cetirizine (ZYRTEC) 10 MG tablet Take 10 mg by mouth daily.    . DULERA 200-5 MCG/ACT AERO INHALE TWO PUFFS BY MOUTH TWICE DAILY 2 Inhaler 0  . ferrous sulfate (FERROUSUL) 325 (65 FE) MG tablet Take 1 tablet (325 mg total) by mouth 3 (three) times daily with meals. 90 tablet 3  . montelukast (SINGULAIR) 10 MG tablet Take 1 tablet (10 mg total) by mouth at bedtime. 30 tablet 0  . cyclobenzaprine (FLEXERIL) 10 MG tablet Take 1 tablet (10 mg total) by mouth 3 (three) times daily as needed for muscle spasms. (Patient not taking: Reported on 07/01/2015) 30 tablet 1  . HYDROcodone-acetaminophen (NORCO) 7.5-325 MG per tablet Take 1 tablet by mouth every 6 (six) hours as needed for moderate pain. (Patient not taking: Reported on 07/01/2015) 30 tablet 0  . predniSONE (DELTASONE) 20 MG tablet Take 0.5 tablets (10 mg total) by mouth daily with breakfast. (Patient  not taking: Reported on 07/01/2015) 10 tablet 0  . clindamycin (CLEOCIN) 300 MG capsule Take 1 capsule (300 mg total) by mouth 3 (three) times daily. 15 capsule 0  . diphenhydrAMINE (BENADRYL) 25 MG tablet Take 25 mg by mouth at bedtime as needed for allergies. Reported on 07/01/2015    . promethazine (PHENERGAN) 25 MG suppository Place 1 suppository (25 mg total) rectally every 6 (six) hours as needed for nausea or vomiting. 12 suppository 1   No facility-administered medications prior to visit.    ROS Review of Systems  Constitutional: Negative for fever and chills.  Eyes: Negative for visual disturbance.  Respiratory: Positive for shortness of breath.   Cardiovascular: Negative for chest pain.  Gastrointestinal: Negative for abdominal pain and blood in stool.  Musculoskeletal: Negative for back pain and arthralgias.  Skin: Negative for rash.  Allergic/Immunologic: Negative for immunocompromised state.  Hematological: Negative for adenopathy. Does not bruise/bleed easily.  Psychiatric/Behavioral: Negative for suicidal ideas and dysphoric mood.    Objective:  BP 119/80 mmHg  Pulse 71  Temp(Src) 98.4 F (36.9 C) (Oral)  Resp 16  Ht  (1.753 m)  Wt 211 lb (95.709 kg)  BMI 31.15 kg/m2  SpO2 97%  LMP 06/08/2015  BP/Weight 07/01/2015 11/03/2014 07/23/2014  Systolic BP 119 112 120  Diastolic BP 80 70 79  Wt. (Lbs) 211 216.25 223.8  BMI 31.15 31.92 33.03  Physical Exam  Constitutional: She is oriented to person, place, and time. She appears well-developed and well-nourished. No distress.  HENT:  Head: Normocephalic and atraumatic.  Cardiovascular: Normal rate, regular rhythm, normal heart sounds and intact distal pulses.   Pulmonary/Chest: Effort normal and breath sounds normal.  Musculoskeletal: She exhibits no edema.  Neurological: She is alert and oriented to person, place, and time.  Skin: Skin is warm and dry. No rash noted.  Psychiatric: She has a normal mood and  affect.     Assessment & Plan:   Benetta SparVictoria was seen today for asthma.  Diagnoses and all orders for this visit:  Asthma, mild persistent, uncomplicated -     albuterol (PROVENTIL HFA;VENTOLIN HFA) 108 (90 Base) MCG/ACT inhaler; Inhale 2 puffs into the lungs every 6 (six) hours as needed for wheezing or shortness of breath. -     albuterol (PROVENTIL) (2.5 MG/3ML) 0.083% nebulizer solution; Take 3 mLs (2.5 mg total) by nebulization every 4 (four) hours as needed for wheezing or shortness of breath. -     mometasone-formoterol (DULERA) 200-5 MCG/ACT AERO; Inhale 2 puffs into the lungs 2 (two) times daily. -     montelukast (SINGULAIR) 10 MG tablet; Take 1 tablet (10 mg total) by mouth at bedtime. -     cetirizine (ZYRTEC) 10 MG tablet; Take 1 tablet (10 mg total) by mouth daily. -     fluticasone (FLONASE) 50 MCG/ACT nasal spray; Place 2 sprays into both nostrils daily. -     Ambulatory referral to Pulmonology   Meds ordered this encounter  Medications  . albuterol (PROVENTIL HFA;VENTOLIN HFA) 108 (90 Base) MCG/ACT inhaler    Sig: Inhale 2 puffs into the lungs every 6 (six) hours as needed for wheezing or shortness of breath.    Dispense:  1 Inhaler    Refill:  5  . albuterol (PROVENTIL) (2.5 MG/3ML) 0.083% nebulizer solution    Sig: Take 3 mLs (2.5 mg total) by nebulization every 4 (four) hours as needed for wheezing or shortness of breath.    Dispense:  75 mL    Refill:  5  . mometasone-formoterol (DULERA) 200-5 MCG/ACT AERO    Sig: Inhale 2 puffs into the lungs 2 (two) times daily.    Dispense:  2 Inhaler    Refill:  5  . montelukast (SINGULAIR) 10 MG tablet    Sig: Take 1 tablet (10 mg total) by mouth at bedtime.    Dispense:  30 tablet    Refill:  5  . cetirizine (ZYRTEC) 10 MG tablet    Sig: Take 1 tablet (10 mg total) by mouth daily.    Dispense:  30 tablet    Refill:  5  . fluticasone (FLONASE) 50 MCG/ACT nasal spray    Sig: Place 2 sprays into both nostrils daily.     Dispense:  16 g    Refill:  6    Follow-up: No Follow-up on file.   Dessa PhiJosalyn Khrystian Schauf MD

## 2015-07-01 NOTE — Progress Notes (Signed)
Medicine refills  Hx asthma, SHOB  No pain today  No tobacco user  No suicidal thoughts in the past two weeks

## 2015-07-04 ENCOUNTER — Encounter: Payer: Self-pay | Admitting: Clinical

## 2015-07-04 NOTE — Assessment & Plan Note (Signed)
A; chronic asthma. No evidence of exacerbation. Not currently on controller meds. P: Refilled all meds rx for neb machine No exacerbation necessitating need for prednisone. Discussed with patient that prednisone needs to be used sparingly, monitored closely, advised against buying prednisone on line  pulm referral Consider adding spiriva if still symptomatic despite max dose of dulera

## 2015-07-04 NOTE — Progress Notes (Signed)
[facility] 2[facility] 07/01/2015 10/12/2013  Decreased Interest 0 0  Down, Depressed, Hopeless 0 0  PHQ - 2 Score 0 0  Altered sleeping 3 -  Tired, decreased energy 3 -  Change in appetite 1 -  Feeling bad or failure about yourself  0 -  Trouble concentrating 0 -  Suicidal thoughts 0 -  PHQ-9 Score 7 -    GAD 7 : Generalized Anxiety Score 07/01/2015  Nervous, Anxious, on Edge 0  Control/stop worrying 0  Worry too much - different things 0  Trouble relaxing 0  Restless 0  Easily annoyed or irritable 0  Afraid - awful might happen 0  Total GAD 7 Score 0

## 2015-07-13 ENCOUNTER — Ambulatory Visit: Payer: Medicaid Other | Admitting: Critical Care Medicine

## 2015-08-05 ENCOUNTER — Other Ambulatory Visit: Payer: Self-pay | Admitting: Family Medicine

## 2015-08-05 ENCOUNTER — Other Ambulatory Visit: Payer: Self-pay | Admitting: Internal Medicine

## 2016-07-01 ENCOUNTER — Other Ambulatory Visit: Payer: Self-pay | Admitting: Family Medicine

## 2016-07-01 DIAGNOSIS — J4530 Mild persistent asthma, uncomplicated: Secondary | ICD-10-CM

## 2016-07-09 ENCOUNTER — Ambulatory Visit: Payer: Self-pay | Attending: Family Medicine | Admitting: Family Medicine

## 2016-07-09 ENCOUNTER — Encounter: Payer: Self-pay | Admitting: Family Medicine

## 2016-07-09 VITALS — BP 115/75 | HR 85 | Temp 98.3°F | Ht 69.0 in | Wt 181.4 lb

## 2016-07-09 DIAGNOSIS — R05 Cough: Secondary | ICD-10-CM | POA: Insufficient documentation

## 2016-07-09 DIAGNOSIS — J4530 Mild persistent asthma, uncomplicated: Secondary | ICD-10-CM | POA: Insufficient documentation

## 2016-07-09 MED ORDER — CETIRIZINE HCL 10 MG PO TABS: 10.0000 mg | ORAL_TABLET | Freq: Every day | ORAL | 6 refills | Status: DC

## 2016-07-09 MED ORDER — FLUTICASONE PROPIONATE 50 MCG/ACT NA SUSP: NASAL | 5 refills | Status: DC

## 2016-07-09 MED ORDER — MOMETASONE FURO-FORMOTEROL FUM 200-5 MCG/ACT IN AERO: 2.0000 | INHALATION_SPRAY | Freq: Two times a day (BID) | RESPIRATORY_TRACT | 5 refills | Status: DC

## 2016-07-09 MED ORDER — ALBUTEROL SULFATE (2.5 MG/3ML) 0.083% IN NEBU: INHALATION_SOLUTION | RESPIRATORY_TRACT | 6 refills | Status: DC

## 2016-07-09 MED ORDER — ALBUTEROL SULFATE (2.5 MG/3ML) 0.083% IN NEBU
2.5000 mg | INHALATION_SOLUTION | Freq: Once | RESPIRATORY_TRACT | Status: AC
  Administered 2016-07-09: 2.5 mg via RESPIRATORY_TRACT

## 2016-07-09 MED ORDER — ALBUTEROL SULFATE HFA 108 (90 BASE) MCG/ACT IN AERS: 2.0000 | INHALATION_SPRAY | Freq: Four times a day (QID) | RESPIRATORY_TRACT | 6 refills | Status: DC | PRN

## 2016-07-09 MED ORDER — PREDNISONE 20 MG PO TABS: ORAL_TABLET | ORAL | 0 refills | Status: DC

## 2016-07-09 MED ORDER — MONTELUKAST SODIUM 10 MG PO TABS: 10.0000 mg | ORAL_TABLET | Freq: Every day | ORAL | 6 refills | Status: DC

## 2016-07-09 MED FILL — FLUTICASONE PROP 50 MCG SPR: 50 | 30 days supply | Qty: 16 | Fill #0

## 2016-07-09 MED FILL — MONTELUKAST SOD 10 MG TAB: 10 | 30 days supply | Qty: 30 | Fill #0

## 2016-07-09 MED FILL — ?PREDNISONE 20 MG TABLET: 20 | 12 days supply | Qty: 16 | Fill #0

## 2016-07-09 MED FILL — VENTOLIN HFA 90 MCG INHALER: 108 (90 BAS | 25 days supply | Qty: 18 | Fill #0

## 2016-07-09 MED FILL — ALBUTEROL 0.083% INHAL SOLN: (2.5 MG/3ML | 5 days supply | Qty: 90 | Fill #0

## 2016-07-09 MED FILL — ?CETIRIZINE HCL 10 MG TABLE: 10 | 30 days supply | Qty: 30 | Fill #0

## 2016-07-09 MED FILL — **DULERA 200 MCG/5 MCG INHA: 200-5 MCG | 15 days supply | Qty: 1 | Fill #0

## 2016-07-09 NOTE — Patient Instructions (Addendum)
Janice Blake was seen today for asthma and cough.  Diagnoses and all orders for this visit:  Mild persistent asthma without complication -     montelukast (SINGULAIR) 10 MG tablet; Take 1 tablet (10 mg total) by mouth at bedtime. -     cetirizine (ZYRTEC) 10 MG tablet; Take 1 tablet (10 mg total) by mouth daily. -     albuterol (PROVENTIL) (2.5 MG/3ML) 0.083% nebulizer solution; USE ONE VIAL IN NEBULIZER EVERY 4 HOURS AS NEEDED FOR WHEEZING OR SHORTNESS OF BREATH -     albuterol (PROVENTIL HFA;VENTOLIN HFA) 108 (90 Base) MCG/ACT inhaler; Inhale 2 puffs into the lungs every 6 (six) hours as needed for wheezing or shortness of breath. -     albuterol (PROVENTIL) (2.5 MG/3ML) 0.083% nebulizer solution 2.5 mg; Take 3 mLs (2.5 mg total) by nebulization once. -     fluticasone (FLONASE) 50 MCG/ACT nasal spray; USE TWO SPRAY(S) IN EACH NOSTRIL ONCE DAILY -     mometasone-formoterol (DULERA) 200-5 MCG/ACT AERO; Inhale 2 puffs into the lungs 2 (two) times daily. -     predniSONE (DELTASONE) 20 MG tablet; Take every morning with food 40 mg daily for 3 days, 30 mg daily for 3 days, 20 mg daily for 3 days, 10 mg daily for 3 days then STOP   Medications sent to onsite pharmacy as samples and patient assistance is available  You are due for pap smear  f/u in 4 weeks for pap smear  Dr. Armen PickupFunches

## 2016-07-09 NOTE — Assessment & Plan Note (Signed)
A: chronic longstanding asthma Med: non compliant P: Restart previous regimen Short steroid taper for current mild exacerbation

## 2016-07-09 NOTE — Progress Notes (Signed)
Subjective:  Patient ID: Janice Blake, female    DOB: [birth year]  Age: 46 y.o. MRN: [id number]  CC: Asthma and Cough   HPI Janice Blake has asthma and nasal polyps she presents for   1. Asthma: she improved her diet over the past year. She lost 30 lbs intentionally. She reports worsening cough, decreased smell, skin irritation on face and around eyes for past 2 weeks.  She is currently taking Flonase and oral antihistamine, no other medications.   Social History  Substance Use Topics  . Smoking status: Never Smoker  . Smokeless tobacco: Never Used  . Alcohol use No    Outpatient Medications Prior to Visit  Medication Sig Dispense Refill  . albuterol (PROVENTIL HFA;VENTOLIN HFA) 108 (90 Base) MCG/ACT inhaler Inhale 2 puffs into the lungs every 6 (six) hours as needed for wheezing or shortness of breath. 1 Inhaler 5  . albuterol (PROVENTIL) (2.5 MG/3ML) 0.083% nebulizer solution USE ONE VIAL IN NEBULIZER EVERY 4 HOURS AS NEEDED FOR WHEEZING OR SHORTNESS OF BREATH 75 mL 0  . fluticasone (FLONASE) 50 MCG/ACT nasal spray USE TWO SPRAY(S) IN EACH NOSTRIL ONCE DAILY 16 g 0  . montelukast (SINGULAIR) 10 MG tablet TAKE ONE TABLET BY MOUTH AT BEDTIME 30 tablet 0  . cetirizine (ZYRTEC) 10 MG tablet TAKE ONE TABLET BY MOUTH ONCE DAILY (Patient not taking: Reported on 07/09/2016) 30 tablet 0  . cyclobenzaprine (FLEXERIL) 10 MG tablet Take 1 tablet (10 mg total) by mouth 3 (three) times daily as needed for muscle spasms. (Patient not taking: Reported on 07/01/2015) 30 tablet 1  . DULERA 200-5 MCG/ACT AERO INHALE TWO PUFFS BY MOUTH TWICE DAILY (Patient not taking: Reported on 07/09/2016) 13 g 0  . ferrous sulfate (FERROUSUL) 325 (65 FE) MG tablet Take 1 tablet (325 mg total) by mouth 3 (three) times daily with meals. (Patient not taking: Reported on 07/09/2016) 90 tablet 3  . HYDROcodone-acetaminophen (NORCO) 7.5-325 MG per tablet Take 1 tablet by mouth every 6 (six) hours as needed for moderate  pain. (Patient not taking: Reported on 07/01/2015) 30 tablet 0  . predniSONE (DELTASONE) 20 MG tablet Take 0.5 tablets (10 mg total) by mouth daily with breakfast. (Patient not taking: Reported on 07/01/2015) 10 tablet 0   No facility-administered medications prior to visit.     ROS Review of Systems  Constitutional: Negative for chills and fever.  HENT: Positive for congestion.   Eyes: Negative for visual disturbance.  Respiratory: Positive for cough.   Cardiovascular: Negative for chest pain.  Gastrointestinal: Negative for abdominal pain and blood in stool.  Musculoskeletal: Negative for arthralgias and back pain.  Skin: Negative for rash.  Allergic/Immunologic: Negative for immunocompromised state.  Hematological: Negative for adenopathy. Does not bruise/bleed easily.  Psychiatric/Behavioral: Negative for dysphoric mood and suicidal ideas.    Objective:  BP 115/75   Pulse 85   Temp 98.3 F (36.8 C) (Oral)   Ht 5\' 9"  (1.753 m)   Wt 181 lb 6.4 oz (82.3 kg)   LMP 06/21/2016   SpO2 99%   PF 170 L/min   BMI 26.79 kg/m   BP/Weight 07/09/2016 07/01/2015 11/03/2014  Systolic BP 115 119 112  Diastolic BP 75 80 70  Wt. (Lbs) 181.4 211 216.25  BMI 26.79 31.15 31.92   Wt Readings from Last 3 Encounters:  07/09/16 181 lb 6.4 oz (82.3 kg)  07/01/15 211 lb (95.7 kg)  11/03/14 216 lb 4 oz (98.1 kg)     Physical Exam  Constitutional: She is oriented to person, place, and time. She appears well-developed and well-nourished. No distress.  HENT:  Head: Normocephalic and atraumatic.  Nose: Mucosal edema (on L ) present.  Cardiovascular: Normal rate, regular rhythm, normal heart sounds and intact distal pulses.   Pulmonary/Chest: Effort normal. She has wheezes (scattered wheezing ).  Musculoskeletal: She exhibits no edema.  Neurological: She is alert and oriented to person, place, and time.  Skin: Skin is warm and dry. No rash noted.  Psychiatric: She has a normal mood and affect.      Assessment & Plan:   Janice Blake was seen today for asthma and cough.  Diagnoses and all orders for this visit:  Mild persistent asthma without complication -     montelukast (SINGULAIR) 10 MG tablet; Take 1 tablet (10 mg total) by mouth at bedtime. -     cetirizine (ZYRTEC) 10 MG tablet; Take 1 tablet (10 mg total) by mouth daily. -     albuterol (PROVENTIL) (2.5 MG/3ML) 0.083% nebulizer solution; USE ONE VIAL IN NEBULIZER EVERY 4 HOURS AS NEEDED FOR WHEEZING OR SHORTNESS OF BREATH -     albuterol (PROVENTIL HFA;VENTOLIN HFA) 108 (90 Base) MCG/ACT inhaler; Inhale 2 puffs into the lungs every 6 (six) hours as needed for wheezing or shortness of breath. -     albuterol (PROVENTIL) (2.5 MG/3ML) 0.083% nebulizer solution 2.5 mg; Take 3 mLs (2.5 mg total) by nebulization once. -     fluticasone (FLONASE) 50 MCG/ACT nasal spray; USE TWO SPRAY(S) IN EACH NOSTRIL ONCE DAILY -     mometasone-formoterol (DULERA) 200-5 MCG/ACT AERO; Inhale 2 puffs into the lungs 2 (two) times daily. -     predniSONE (DELTASONE) 20 MG tablet; Take every morning with food 40 mg daily for 3 days, 30 mg daily for 3 days, 20 mg daily for 3 days, 10 mg daily for 3 days then STOP   No orders of the defined types were placed in this encounter.   Follow-up: Return in about 4 weeks (around 08/06/2016) for pap smear .   Dessa PhiJosalyn Jazlin Tapscott MD

## 2016-08-09 ENCOUNTER — Other Ambulatory Visit: Payer: Self-pay | Admitting: Family Medicine

## 2016-09-05 ENCOUNTER — Encounter: Payer: Self-pay | Admitting: Family Medicine

## 2016-09-14 MED FILL — MONTELUKAST SOD 10 MG TAB: 10 | 30 days supply | Qty: 30 | Fill #1

## 2016-09-18 MED FILL — DULERA 200 MCG/5 MCG INH: 200-5 | 30 days supply | Qty: 13 | Fill #0

## 2016-09-18 MED FILL — !VENTOLIN HFA INHALER: 108 (90 BAS | 25 days supply | Qty: 18 | Fill #1

## 2016-10-22 ENCOUNTER — Ambulatory Visit: Payer: Self-pay | Admitting: Internal Medicine

## 2016-10-22 MED FILL — FLUTICASONE PROP 50 MCG SPR: 50 | 30 days supply | Qty: 16 | Fill #1

## 2016-10-22 MED FILL — MONTELUKAST SOD 10 MG TAB: 10 | 30 days supply | Qty: 30 | Fill #2

## 2016-10-22 MED FILL — ?CETIRIZINE HCL 10 MG TABLE: 10 | 30 days supply | Qty: 30 | Fill #1

## 2016-10-22 MED FILL — ALBUTEROL 0.083% INHAL SOLN: (2.5 MG/3ML | 5 days supply | Qty: 90 | Fill #1

## 2016-10-22 MED FILL — VENTOLIN HFA 90 MCG INHALER: 108 (90 BAS | 25 days supply | Qty: 18 | Fill #2

## 2016-10-22 MED FILL — DULERA 200 MCG/5 MCG INH: 200-5 | 30 days supply | Qty: 13 | Fill #1

## 2016-11-13 ENCOUNTER — Ambulatory Visit: Payer: Self-pay | Admitting: Family Medicine

## 2017-03-25 MED FILL — DULERA 200 MCG/5 MCG INH: 200-5 | 30 days supply | Qty: 13 | Fill #2

## 2017-03-25 MED FILL — ?MONTELUKAST SOD 10MG TAB: 10 | 30 days supply | Qty: 30 | Fill #3

## 2017-03-29 MED FILL — !VENTOLIN HFA INHALER: 108 (90 BAS | 25 days supply | Qty: 18 | Fill #3

## 2017-05-31 MED FILL — ?MONTELUKAST SOD 10 MG TAB: 10 | 30 days supply | Qty: 30 | Fill #4

## 2017-05-31 MED FILL — !VENTOLIN HFA INHALER: 108 (90 BAS | 25 days supply | Qty: 18 | Fill #4

## 2017-05-31 MED FILL — **DULERA 200 MCG/5 MCG INHA: 200-5 MCG | 15 days supply | Qty: 13 | Fill #3

## 2017-06-14 ENCOUNTER — Ambulatory Visit: Payer: Self-pay | Attending: Internal Medicine

## 2017-06-14 ENCOUNTER — Encounter: Payer: Self-pay | Admitting: Pharmacy Technician

## 2017-07-23 ENCOUNTER — Ambulatory Visit (INDEPENDENT_AMBULATORY_CARE_PROVIDER_SITE_OTHER): Payer: Self-pay | Admitting: Physician Assistant

## 2017-09-04 ENCOUNTER — Encounter (HOSPITAL_COMMUNITY): Payer: Self-pay | Admitting: Emergency Medicine

## 2017-09-04 ENCOUNTER — Ambulatory Visit (HOSPITAL_COMMUNITY)
Admission: EM | Admit: 2017-09-04 | Discharge: 2017-09-04 | Disposition: A | Discharge status: Home / Self Care | Payer: Self-pay | Attending: Family Medicine | Admitting: Family Medicine

## 2017-09-04 ENCOUNTER — Other Ambulatory Visit: Payer: Self-pay

## 2017-09-04 DIAGNOSIS — J4530 Mild persistent asthma, uncomplicated: Secondary | ICD-10-CM

## 2017-09-04 DIAGNOSIS — R062 Wheezing: Secondary | ICD-10-CM

## 2017-09-04 DIAGNOSIS — J4541 Moderate persistent asthma with (acute) exacerbation: Secondary | ICD-10-CM

## 2017-09-04 MED ORDER — IPRATROPIUM-ALBUTEROL 0.5-2.5 (3) MG/3ML IN SOLN
RESPIRATORY_TRACT | Status: AC
  Filled 2017-09-04: qty 3

## 2017-09-04 MED ORDER — MOMETASONE FURO-FORMOTEROL FUM 200-5 MCG/ACT IN AERO: 2.0000 | INHALATION_SPRAY | Freq: Two times a day (BID) | RESPIRATORY_TRACT | 0 refills | Status: DC

## 2017-09-04 MED ORDER — METHYLPREDNISOLONE SODIUM SUCC 125 MG IJ SOLR
INTRAMUSCULAR | Status: AC
  Filled 2017-09-04: qty 2

## 2017-09-04 MED ORDER — MONTELUKAST SODIUM 10 MG PO TABS: 10.0000 mg | ORAL_TABLET | Freq: Every day | ORAL | 1 refills | Status: DC

## 2017-09-04 MED ORDER — IPRATROPIUM-ALBUTEROL 0.5-2.5 (3) MG/3ML IN SOLN
3.0000 mL | Freq: Once | RESPIRATORY_TRACT | Status: AC
Start: 2017-09-04 — End: 2017-09-04
  Administered 2017-09-04: 3 mL via RESPIRATORY_TRACT

## 2017-09-04 MED ORDER — METHYLPREDNISOLONE SODIUM SUCC 125 MG IJ SOLR
125.0000 mg | Freq: Once | INTRAMUSCULAR | Status: AC
  Administered 2017-09-04: 125 mg via INTRAMUSCULAR

## 2017-09-04 MED ORDER — IPRATROPIUM-ALBUTEROL 0.5-2.5 (3) MG/3ML IN SOLN
RESPIRATORY_TRACT | Status: AC
Start: 2017-09-04 — End: 2017-09-04
  Filled 2017-09-04: qty 3

## 2017-09-04 MED ORDER — IPRATROPIUM-ALBUTEROL 0.5-2.5 (3) MG/3ML IN SOLN
3.0000 mL | Freq: Once | RESPIRATORY_TRACT | Status: AC
  Administered 2017-09-04: 3 mL via RESPIRATORY_TRACT

## 2017-09-04 MED ORDER — PREDNISONE 20 MG PO TABS: ORAL_TABLET | ORAL | 0 refills | Status: DC

## 2017-09-04 NOTE — Discharge Instructions (Signed)
Solu-Medrol injection in office today.  Start singular and prednisone as directed.  Continue albuterol inhaler or nebulizer for wheezing or shortness of breath.  I have sent for [facility] to [facility], please restart medication for asthma.  Follow-up as scheduled with her primary care for further evaluation and management of asthma.  Follow-up here for further evaluation if symptoms worsens or does not resolve.

## 2017-09-04 NOTE — ED Triage Notes (Signed)
Patient reports asthma issues for a week.  Patient has been using family members nebulizer.  Patient has an appt with family medicine 5/28.  Patient cannot wait.  Speaking in complete sentences, but audible inspiratory and expiratory wheezes, frequent coughing

## 2017-09-04 NOTE — ED Provider Notes (Signed)
[facility]    CSN: [id number] Arrival date & time: 09/04/17  1659     History   Chief Complaint Chief Complaint  Patient presents with  . Asthma    HPI Janice Blake is a 47 y.o. female.   47 year old female comes in for 1 week history of asthma exacerbation. States she is usually on dulera and singulair, but ran out of medicine, [facility] transitioned her to a new PCP, but isn't until 5/28. States has been using albuterol without relief. Cough that is mostly at night time and chest tightness.  Denies fever, chills, night sweats.  Denies urinary, nasal congestion.      Past Medical History:  Diagnosis Date  . Anemia   . Asthma   . 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 Fracture of right tibia   . Nasal polyps   . Shortness of breath    "just related to the asthma" (05/05/2013)    Patient Active Problem List   Diagnosis Date Noted  . Back muscle spasm 01/22/2014  . Menorrhagia with regular cycle 11/30/2013  . Anemia, iron deficiency 11/30/2013  . History of nasal polyp 11/30/2013  . Allergic rhinitis 05/05/2013  . Asthma     Past Surgical History:  Procedure Laterality Date  . FRACTURE SURGERY    . NASAL POLYP SURGERY Bilateral 2011  . NASAL SINUS SURGERY Bilateral 11/03/2014   Procedure: BILATERAL ENDOSCOPIC POLYPECTOMY ETHMOIDECTOMY MAXILLARY ANTROSTOMY ;  Surgeon: Serena Colonel, MD;  Location: [facility];  Service: ENT;  Laterality: Bilateral;  . PATELLA FRACTURE SURGERY Right 1996   "crushed; rod w/12 screws on one side, 6 screws on the other"  . TUBAL LIGATION  1994    OB History   None      Home Medications    Prior to Admission medications   Medication Sig Start Date End Date Taking? Authorizing Provider  fluticasone (FLONASE) 50 MCG/ACT nasal spray USE TWO SPRAY(S) IN EACH NOSTRIL ONCE DAILY 07/09/16  Yes Funches, Josalyn, MD  albuterol (PROVENTIL  HFA;VENTOLIN HFA) 108 (90 Base) MCG/ACT inhaler Inhale 2 puffs into the lungs every 6 (six) hours as needed for wheezing or shortness of breath. 07/09/16   Funches, Gerilyn Nestle, MD  albuterol (PROVENTIL) (2.5 MG/3ML) 0.083% nebulizer solution USE ONE VIAL IN NEBULIZER EVERY 4 HOURS AS NEEDED FOR WHEEZING OR SHORTNESS OF BREATH 07/09/16   Funches, Gerilyn Nestle, MD  mometasone-formoterol (DULERA) 200-5 MCG/ACT AERO Inhale 2 puffs into the lungs 2 (two) times daily. 09/04/17   Cathie Hoops, Ronon Ferger V, PA-C  montelukast (SINGULAIR) 10 MG tablet Take 1 tablet (10 mg total) by mouth at bedtime. 09/04/17   Cathie Hoops, Jeronda Don V, PA-C  predniSONE (DELTASONE) 20 MG tablet Take every morning with food 40 mg daily for 3 days, 30 mg daily for 3 days, 20 mg daily for 3 days, 10 mg daily for 3 days then STOP 09/04/17   Belinda Fisher, PA-C    Family History Family History  Problem Relation Age of Onset  . Hypertension Father   . Asthma Son   . Diabetes Maternal Grandmother   . Hypertension Maternal Grandfather   . Cancer Paternal Grandmother   . Cancer Paternal Grandfather        lung cancer     Social History Social History   Tobacco Use  . Smoking status: Never Smoker  . Smokeless tobacco: Never Used  Substance Use Topics  .  Alcohol use: No  . Drug use: No     Allergies   Amoxicillin and Levaquin [levofloxacin in d5w]   Review of Systems Review of Systems  Reason unable to perform ROS: See HPI as above.     Physical Exam Triage Vital Signs ED Triage Vitals  Enc Vitals Group     BP 09/04/17 1709 129/80     Pulse Rate 09/04/17 1709 85     Resp 09/04/17 1709 (!) 28     Temp 09/04/17 1709 98 F (36.7 C)     Temp Source 09/04/17 1709 Oral     SpO2 09/04/17 1709 100 %     Weight --      Height --      Head Circumference --      Peak Flow --      Pain Score 09/04/17 1706 4     Pain Loc --      Pain Edu? --      Excl. in GC? --    No data found.  Updated Vital Signs BP 129/80 (BP Location: Left Arm)   Pulse 85    Temp 98 F (36.7 C) (Oral)   Resp (!) 28   LMP 08/21/2017   SpO2 100%   Physical Exam  Constitutional: She is oriented to person, place, and time. She appears well-developed and well-nourished. No distress.  HENT:  Head: Normocephalic and atraumatic.  Right Ear: Tympanic membrane, external ear and ear canal normal. Tympanic membrane is not erythematous and not bulging.  Left Ear: Tympanic membrane, external ear and ear canal normal. Tympanic membrane is not erythematous and not bulging.  Nose: Nose normal. Right sinus exhibits no maxillary sinus tenderness and no frontal sinus tenderness. Left sinus exhibits no maxillary sinus tenderness and no frontal sinus tenderness.  Mouth/Throat: Uvula is midline, oropharynx is clear and moist and mucous membranes are normal.  Eyes: Pupils are equal, round, and reactive to light. Conjunctivae are normal.  Neck: Normal range of motion. Neck supple.  Cardiovascular: Normal rate, regular rhythm and normal heart sounds. Exam reveals no gallop and no friction rub.  No murmur heard. Pulmonary/Chest: Effort normal. No accessory muscle usage. No respiratory distress.  Diffuse inspiratory and expiratory wheezing, much improved after DuoNeb.  Improved air movement.  Lymphadenopathy:    She has no cervical adenopathy.  Neurological: She is alert and oriented to person, place, and time.  Skin: Skin is warm and dry.  Psychiatric: She has a normal mood and affect. Her behavior is normal. Judgment normal.     UC Treatments / Results  Labs (all labs ordered are listed, but only abnormal results are displayed) Labs Reviewed - No data to display  EKG None  Radiology No results found.  Procedures Procedures (including critical care time)  Medications Ordered in UC Medications  ipratropium-albuterol (DUONEB) 0.5-2.5 (3) MG/3ML nebulizer solution 3 mL (3 mLs Nebulization Given 09/04/17 1721)  ipratropium-albuterol (DUONEB) 0.5-2.5 (3) MG/3ML nebulizer  solution 3 mL (3 mLs Nebulization Given 09/04/17 1747)  methylPREDNISolone sodium succinate (SOLU-MEDROL) 125 mg/2 mL injection 125 mg (125 mg Intramuscular Given 09/04/17 1743)    Initial Impression / Assessment and Plan / UC Course  I have reviewed the triage vital signs and the nursing notes.  Pertinent labs & imaging results that were available during my care of the patient were reviewed by me and considered in my medical decision making (see chart for details).  Clinical Course as of Sep 04 2017  Wed Sep 04, 2017  1717 Patient with audible inspiratory and expiratory wheezing.  Will start DuoNeb prior to HPI.   [AY]    Clinical Course User Index [AY] Belinda Fisher, PA-C   Patient with continued improvement after second DuoNeb.  Still with mild inspiratory and expiratory wheezing, but much improved air movement.  Patient with improved symptoms.  Solu-Medrol injection in office today.  Medications refilled.  Prednisone as directed.  Return precautions given.  Patient expresses understanding and agrees to plan.  Final Clinical Impressions(s) / UC Diagnoses   Final diagnoses:  Moderate persistent asthma with exacerbation    ED Prescriptions    Medication Sig Dispense Auth. Provider   montelukast (SINGULAIR) 10 MG tablet Take 1 tablet (10 mg total) by mouth at bedtime. 30 tablet Stefani Baik V, PA-C   predniSONE (DELTASONE) 20 MG tablet Take every morning with food 40 mg daily for 3 days, 30 mg daily for 3 days, 20 mg daily for 3 days, 10 mg daily for 3 days then STOP 16 tablet Lorre Opdahl V, PA-C   mometasone-formoterol (DULERA) 200-5 MCG/ACT AERO Inhale 2 puffs into the lungs 2 (two) times daily. 13 g Threasa Alpha, [place] 09/04/17 2019

## 2017-09-17 ENCOUNTER — Ambulatory Visit (INDEPENDENT_AMBULATORY_CARE_PROVIDER_SITE_OTHER): Payer: Self-pay | Admitting: Nurse Practitioner

## 2017-10-15 ENCOUNTER — Ambulatory Visit: Payer: BLUE CROSS/BLUE SHIELD | Attending: Internal Medicine | Admitting: Internal Medicine

## 2017-10-15 ENCOUNTER — Encounter: Payer: Self-pay | Admitting: Internal Medicine

## 2017-10-15 VITALS — BP 114/74 | HR 80 | Temp 98.1°F | Resp 16 | Ht 69.0 in | Wt 202.6 lb

## 2017-10-15 DIAGNOSIS — Z23 Encounter for immunization: Secondary | ICD-10-CM | POA: Diagnosis not present

## 2017-10-15 DIAGNOSIS — Z6829 Body mass index (BMI) 29.0-29.9, adult: Secondary | ICD-10-CM | POA: Diagnosis not present

## 2017-10-15 DIAGNOSIS — Z880 Allergy status to penicillin: Secondary | ICD-10-CM | POA: Insufficient documentation

## 2017-10-15 DIAGNOSIS — D509 Iron deficiency anemia, unspecified: Secondary | ICD-10-CM

## 2017-10-15 DIAGNOSIS — E663 Overweight: Secondary | ICD-10-CM | POA: Diagnosis not present

## 2017-10-15 DIAGNOSIS — J4540 Moderate persistent asthma, uncomplicated: Secondary | ICD-10-CM | POA: Diagnosis not present

## 2017-10-15 DIAGNOSIS — L309 Dermatitis, unspecified: Secondary | ICD-10-CM | POA: Diagnosis not present

## 2017-10-15 MED ORDER — MONTELUKAST SODIUM 10 MG PO TABS: 10.0000 mg | ORAL_TABLET | Freq: Every day | ORAL | 6 refills | Status: DC

## 2017-10-15 MED ORDER — TRIAMCINOLONE ACETONIDE 0.1 % EX CREA: 1.0000 "application " | TOPICAL_CREAM | Freq: Two times a day (BID) | CUTANEOUS | 1 refills | Status: DC

## 2017-10-15 MED ORDER — ALBUTEROL SULFATE HFA 108 (90 BASE) MCG/ACT IN AERS: 2.0000 | INHALATION_SPRAY | Freq: Four times a day (QID) | RESPIRATORY_TRACT | 12 refills | Status: DC | PRN

## 2017-10-15 MED ORDER — TETANUS-DIPHTH-ACELL PERTUSSIS 5-2.5-18.5 LF-MCG/0.5 IM SUSP: 0.5000 mL | Freq: Once | INTRAMUSCULAR | 0 refills | Status: AC

## 2017-10-15 MED ORDER — MOMETASONE FURO-FORMOTEROL FUM 200-5 MCG/ACT IN AERO
2.0000 | INHALATION_SPRAY | Freq: Two times a day (BID) | RESPIRATORY_TRACT | 12 refills | Status: DC
Start: 2017-10-15 — End: 2018-07-03

## 2017-10-15 MED ORDER — FERROUS SULFATE 325 (65 FE) MG PO TABS: 325.0000 mg | ORAL_TABLET | Freq: Two times a day (BID) | ORAL | 1 refills | Status: DC

## 2017-10-15 MED FILL — TRIAMCINOLONE ACETONIDE 0.1: 0.1 | 15 days supply | Qty: 30 | Fill #0

## 2017-10-15 NOTE — Progress Notes (Signed)
Patient ID: Janice Blake, female    DOB: April 03, 1971  MRN: [id number]  CC: re-establish and Asthma   Subjective: Janice Blake is a 47 y.o. female who presents for chronic ds management and to est with me as PCP. Her concerns today include:  Pt with hx of asthma, anemia  Seen in ED 09/04/2017 with asthma exacerbation after running out of Dulera and Singulair.  Asthma:  Still out of Dulera.  Having to use the Albuterol -4 x a day. Controlled when she is on Singulair and Dulera.  Never smoke Has a peak flow meter at home but does not use it.  Iron def Anemia: she has not had CBC checked in a while.  She constantly eat ice.  -menses regular with moderate bleeding lasting 5 days.  No clots.  Over due for pap. Would like to schedule  Weight:  Eats lots of carbs; eats a loaded bake potato every day.  Does not get in much exercise.   Patient Active Problem List   Diagnosis Date Noted  . Back muscle spasm 01/22/2014  . Menorrhagia with regular cycle 11/30/2013  . Anemia, iron deficiency 11/30/2013  . History of nasal polyp 11/30/2013  . Allergic rhinitis 05/05/2013  . Asthma      Current Outpatient Medications on File Prior to Visit  Medication Sig Dispense Refill  . albuterol (PROVENTIL) (2.5 MG/3ML) 0.083% nebulizer solution USE ONE VIAL IN NEBULIZER EVERY 4 HOURS AS NEEDED FOR WHEEZING OR SHORTNESS OF BREATH (Patient not taking: Reported on 10/15/2017) 75 mL 6  . fluticasone (FLONASE) 50 MCG/ACT nasal spray USE TWO SPRAY(S) IN EACH NOSTRIL ONCE DAILY (Patient not taking: Reported on 10/15/2017) 16 g 5   No current facility-administered medications on file prior to visit.     Allergies  Allergen Reactions  . Amoxicillin Hives and Itching  . Levaquin [Levofloxacin In D5w] Hives and Itching    Social History   Socioeconomic History  . Marital status: Single    Spouse name: Not on file  . Number of children: Not on file  . Years of education: Not on file  . Highest  education level: Not on file  Occupational History  . Not on file  Social Needs  . Financial resource strain: Not on file  . Food insecurity:    Worry: Not on file    Inability: Not on file  . Transportation needs:    Medical: Not on file    Non-medical: Not on file  Tobacco Use  . Smoking status: Never Smoker  . Smokeless tobacco: Never Used  Substance and Sexual Activity  . Alcohol use: No  . Drug use: No  . Sexual activity: Not Currently  Lifestyle  . Physical activity:    Days per week: Not on file    Minutes per session: Not on file  . Stress: Not on file  Relationships  . Social connections:    Talks on phone: Not on file    Gets together: Not on file    Attends religious service: Not on file    Active member of club or organization: Not on file    Attends meetings of clubs or organizations: Not on file    Relationship status: Not on file  . Intimate partner violence:    Fear of current or ex partner: Not on file    Emotionally abused: Not on file    Physically abused: Not on file    Forced sexual activity: Not on file  Other Topics Concern  . 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 3/4 sons live in [place]             Family History  Problem Relation Age of Onset  . Hypertension Father   . Asthma Son   . Diabetes Maternal Grandmother   . Hypertension Maternal Grandfather   . Cancer Paternal Grandmother   . Cancer Paternal Grandfather        lung cancer     Past Surgical History:  Procedure Laterality Date  . FRACTURE SURGERY    . NASAL POLYP SURGERY Bilateral 2011  . NASAL SINUS SURGERY Bilateral 11/03/2014   Procedure: BILATERAL ENDOSCOPIC POLYPECTOMY ETHMOIDECTOMY MAXILLARY ANTROSTOMY ;  Surgeon: Serena ColonelJefry Rosen, MD;  Location: [facility];  Service: ENT;  Laterality: Bilateral;  . PATELLA FRACTURE  SURGERY Right 1996   "crushed; rod w/12 screws on one side, 6 screws on the other"  . TUBAL LIGATION  1994    ROS: Review of Systems Derm: Has a chronic itchy rash on the lateral aspect of the left lower leg times several months.  States that she had mentioned it to previous provider whom she saw and was told to use over-the-counter anti-itch cream.  Lesion has statins increased in size. PHYSICAL EXAM: BP 114/74   Pulse 80   Temp 98.1 F (36.7 C) (Oral)   Resp 16   Ht 5\' 9"  (1.753 m)   Wt 202 lb 9.6 oz (91.9 kg)   SpO2 99%   BMI 29.92 kg/m   Wt Readings from Last 3 Encounters:  10/15/17 202 lb 9.6 oz (91.9 kg)  07/09/16 181 lb 6.4 oz (82.3 kg)  07/01/15 211 lb (95.7 kg)    Physical Exam  General appearance - alert, well appearing, and in no distress Mental status - alert, oriented to person, place, and time, normal mood, behavior, speech, dress, motor activity, and thought processes Eyes -pale conjunctiva  neck - supple, no significant adenopathy Chest - clear to auscultation, no wheezes, rales or rhonchi, symmetric air entry Heart - normal rate, regular rhythm, normal S1, S2, no murmurs, rubs, clicks or gallops Extremities - peripheral pulses normal, no pedal edema, no clubbing or cyanosis Skin -raised rough appearing lesion with surrounding hyperpigmentation on the lateral aspect of the left lower leg      ASSESSMENT AND PLAN: 1. Moderate persistent asthma without complication -Recommend daily use of peak flow meter. Went over when she needs to be seen based on symptoms and decline in peak flow measurements Refill Dulera and Singulair - Comprehensive metabolic panel - mometasone-formoterol (DULERA) 200-5 MCG/ACT AERO; Inhale 2 puffs into the lungs 2 (two) times daily.  Dispense: 13 g; Refill: 12 - albuterol (PROVENTIL HFA;VENTOLIN HFA) 108 (90 Base) MCG/ACT inhaler; Inhale 2 puffs into the lungs every 6 (six) hours as needed for wheezing or shortness of breath.   Dispense: 1 Inhaler; Refill: 12 - montelukast (SINGULAIR) 10 MG tablet; Take 1 tablet (10 mg total) by mouth at bedtime.  Dispense: 30 tablet; Refill: 6  2. Dermatitis Questionable lichen rash versus skin CA.  Will refer to dermatology - triamcinolone cream (KENALOG) 0.1 %; Apply 1 application topically 2 (two) times daily.  Dispense: 30 g; Refill: 1 - Ambulatory referral to Dermatology  3. Overweight Discussed the importance of  healthy eating habits and regular exercise.  Advised to cut back on white carbohydrates.  Encourage some form of aerobic exercise 3-4 times a week for 30 minutes - Lipid panel  4. Iron deficiency anemia, unspecified iron deficiency anemia type Advised to stop eating ice - CBC - ferrous sulfate 325 (65 FE) MG tablet; Take 1 tablet (325 mg total) by mouth 2 (two) times daily with a meal.  Dispense: 100 tablet; Refill: 1  5. Need for Tdap vaccination - Tdap vaccine greater than or equal to 7yo IM  HM: She is agreeable to being scheduled for Pap smear.  She declines HIV test stating that she had one done about a month or so ago at a free health screen that was negative  Patient was given the opportunity to ask questions.  Patient verbalized understanding of the plan and was able to repeat key elements of the plan.   Orders Placed This Encounter  Procedures  . Tdap vaccine greater than or equal to 7yo IM  . CBC  . Comprehensive metabolic panel  . Lipid panel  . Ambulatory referral to Dermatology     Requested Prescriptions   Signed Prescriptions Disp Refills  . mometasone-formoterol (DULERA) 200-5 MCG/ACT AERO 13 g 12    Sig: Inhale 2 puffs into the lungs 2 (two) times daily.  Marland Kitchen albuterol (PROVENTIL HFA;VENTOLIN HFA) 108 (90 Base) MCG/ACT inhaler 1 Inhaler 12    Sig: Inhale 2 puffs into the lungs every 6 (six) hours as needed for wheezing or shortness of breath.  . montelukast (SINGULAIR) 10 MG tablet 30 tablet 6    Sig: Take 1 tablet (10 mg total) by  mouth at bedtime.  . triamcinolone cream (KENALOG) 0.1 % 30 g 1    Sig: Apply 1 application topically 2 (two) times daily.  . ferrous sulfate 325 (65 FE) MG tablet 100 tablet 1    Sig: Take 1 tablet (325 mg total) by mouth 2 (two) times daily with a meal.  . Tdap (BOOSTRIX) 5-2.5-18.5 LF-MCG/0.5 injection 0.5 mL 0    Sig: Inject 0.5 mLs into the muscle once for 1 dose.    Return in about 6 weeks (around 11/26/2017) for PAP.  Jonah Blue, MD, FACP

## 2017-10-15 NOTE — Patient Instructions (Addendum)
Stop eating ice.  I have referred you to a dermatologist.   Td Vaccine (Tetanus and Diphtheria): What You Need to Know 1. Why get vaccinated? Tetanus  and diphtheria are very serious diseases. They are rare in the [place] today, but people who do become infected often have severe complications. Td vaccine is used to protect adolescents and adults from both of these diseases. Both tetanus and diphtheria are infections caused by bacteria. Diphtheria spreads from person to person through coughing or sneezing. Tetanus-causing bacteria enter the body through cuts, scratches, or wounds. TETANUS (lockjaw) causes painful muscle tightening and stiffness, usually all over the body.  It can lead to tightening of muscles in the head and neck so you can't open your mouth, swallow, or sometimes even breathe. Tetanus kills about 1 out of every 10 people who are infected even after receiving the best medical care.  DIPHTHERIA can cause a thick coating to form in the back of the throat.  It can lead to breathing problems, paralysis, heart failure, and death.  Before vaccines, as many as 200,000 cases of diphtheria and hundreds of cases of tetanus were reported in the [place] each year. Since vaccination began, reports of cases for both diseases have dropped by about 99%. 2. Td vaccine Td vaccine can protect adolescents and adults from tetanus and diphtheria. Td is usually given as a booster dose every 10 years but it can also be given earlier after a severe and dirty wound or burn. Another vaccine, called Tdap, which protects against pertussis in addition to tetanus and diphtheria, is sometimes recommended instead of Td vaccine. Your doctor or the person giving you the vaccine can give you more information. Td may safely be given at the same time as other vaccines. 3. Some people should not get this vaccine  A person who has ever had a life-threatening allergic reaction after a previous dose of any  tetanus or diphtheria containing vaccine, OR has a severe allergy to any part of this vaccine, should not get Td vaccine. Tell the person giving the vaccine about any severe allergies.  Talk to your doctor if you: ? had severe pain or swelling after any vaccine containing diphtheria or tetanus, ? ever had a condition called Guillain Barre Syndrome (GBS), ? aren't feeling well on the day the shot is scheduled. 4. What are the risks from Td vaccine? With any medicine, including vaccines, there is a chance of side effects. These are usually mild and go away on their own. Serious reactions are also possible but are rare. Most people who get Td vaccine do not have any problems with it. Mild problems following Td vaccine: (Did not interfere with activities)  Pain where the shot was given (about 8 people in 10)  Redness or swelling where the shot was given (about 1 person in 4)  Mild fever (rare)  Headache (about 1 person in 4)  Tiredness (about 1 person in 4)  Moderate problems following Td vaccine: (Interfered with activities, but did not require medical attention)  Fever over 102F (rare)  Severe problems following Td vaccine: (Unable to perform usual activities; required medical attention)  Swelling, severe pain, bleeding and/or redness in the arm where the shot was given (rare).  Problems that could happen after any vaccine:  People sometimes faint after a medical procedure, including vaccination. Sitting or lying down for about 15 minutes can help prevent fainting, and injuries caused by a fall. Tell your doctor if you feel dizzy,  or have vision changes or ringing in the ears.  Some people get severe pain in the shoulder and have difficulty moving the arm where a shot was given. This happens very rarely.  Any medication can cause a severe allergic reaction. Such reactions from a vaccine are very rare, estimated at fewer than 1 in a million doses, and would happen within a few  minutes to a few hours after the vaccination. As with any medicine, there is a very remote chance of a vaccine causing a serious injury or death. The safety of vaccines is always being monitored. For more information, visit: http://floyd.org/ 5. What if there is a serious reaction? What should I look for? Look for anything that concerns you, such as signs of a severe allergic reaction, very high fever, or unusual behavior. Signs of a severe allergic reaction can include hives, swelling of the face and throat, difficulty breathing, a fast heartbeat, dizziness, and weakness. These would usually start a few minutes to a few hours after the vaccination. What should I do?  If you think it is a severe allergic reaction or other emergency that can't wait, call 9-1-1 or get the person to the nearest hospital. Otherwise, call your doctor.  Afterward, the reaction should be reported to the Vaccine Adverse Event Reporting System (VAERS). Your doctor might file this report, or you can do it yourself through the VAERS web site at www.vaers.LAgents.no, or by calling 1-505-781-4730. ? VAERS does not give medical advice. 6. The National Vaccine Injury Compensation Program The [employer] Vaccine Injury Compensation Program (VICP) is a federal program that was created to compensate people who may have been injured by certain vaccines. Persons who believe they may have been injured by a vaccine can learn about the program and about filing a claim by calling 1-418-822-1822 or visiting the VICP website at SpiritualWord.at. There is a time limit to file a claim for compensation. 7. How can I learn more?  Ask your doctor. He or she can give you the vaccine package insert or suggest other sources of information.  Call your local or state health department.  Contact the Centers for Disease Control and Prevention (CDC): ? Call 270-775-8455 (1-800-CDC-INFO) ? Visit CDC's website at  PicCapture.uy CDC Td Vaccine VIS (08/02/15) This information is not intended to replace advice given to you by your health care provider. Make sure you discuss any questions you have with your health care provider. Document Released: 02/04/2006 Document Revised: 12/29/2015 Document Reviewed: 12/29/2015 Elsevier Interactive Patient Education  2017 [employer].

## 2017-10-16 LAB — CBC
HEMATOCRIT: 30 % — AB (ref 34.0–46.6)
HEMOGLOBIN: 8.9 g/dL — AB (ref 11.1–15.9)
MCH: 21.4 pg — ABNORMAL LOW (ref 26.6–33.0)
MCHC: 29.7 g/dL — ABNORMAL LOW (ref 31.5–35.7)
MCV: 72 fL — ABNORMAL LOW (ref 79–97)
PLATELETS: 484 10*3/uL — AB (ref 150–450)
RBC: 4.15 x10E6/uL (ref 3.77–5.28)
RDW: 19 % — ABNORMAL HIGH (ref 12.3–15.4)
WBC: 4.9 10*3/uL (ref 3.4–10.8)

## 2017-10-16 LAB — COMPREHENSIVE METABOLIC PANEL
ALT: 7 IU/L (ref 0–32)
AST: 10 IU/L (ref 0–40)
Albumin/Globulin Ratio: 1.2 (ref 1.2–2.2)
Albumin: 3.9 g/dL (ref 3.5–5.5)
Alkaline Phosphatase: 109 IU/L (ref 39–117)
BUN/Creatinine Ratio: 8 — ABNORMAL LOW (ref 9–23)
BUN: 8 mg/dL (ref 6–24)
Bilirubin Total: <0.2 mg/dL (ref 0.0–1.2)
CALCIUM: 8.8 mg/dL (ref 8.7–10.2)
CHLORIDE: 104 mmol/L (ref 96–106)
CO2: 21 mmol/L (ref 20–29)
Creatinine, Ser: 1.05 mg/dL — ABNORMAL HIGH (ref 0.57–1.00)
GFR, EST AFRICAN AMERICAN: 73 mL/min/{1.73_m2} (ref >59)
GFR, EST NON AFRICAN AMERICAN: 63 mL/min/{1.73_m2} (ref >59)
GLUCOSE: 90 mg/dL (ref 65–99)
Globulin, Total: 3.2 g/dL (ref 1.5–4.5)
Potassium: 3.6 mmol/L (ref 3.5–5.2)
Sodium: 137 mmol/L (ref 134–144)
TOTAL PROTEIN: 7.1 g/dL (ref 6.0–8.5)

## 2017-10-16 LAB — LIPID PANEL
Chol/HDL Ratio: 3.6 ratio (ref 0.0–4.4)
Cholesterol, Total: 167 mg/dL (ref 100–199)
HDL: 47 mg/dL (ref >39)
LDL Calculated: 95 mg/dL (ref 0–99)
Triglycerides: 123 mg/dL (ref 0–149)
VLDL CHOLESTEROL CAL: 25 mg/dL (ref 5–40)

## 2017-11-07 ENCOUNTER — Telehealth: Payer: Self-pay | Admitting: Internal Medicine

## 2017-11-07 NOTE — Telephone Encounter (Signed)
Pt called to request a medication change until she goes to a specialty clinic  -triamcinolone cream (KENALOG) 0.1 % Is not working and she would like to try antibiotics or any other alternative available, Please place pt a call around 12:00 if possible

## 2017-11-07 NOTE — Telephone Encounter (Signed)
Will forward to pcp

## 2017-11-08 NOTE — Telephone Encounter (Signed)
Lvm asking pt to give me a call at her earliest convenience

## 2017-11-14 ENCOUNTER — Telehealth: Payer: Self-pay

## 2017-11-14 NOTE — Telephone Encounter (Signed)
Patient called stating the Triamcinolone cream is not helping her leg.  Patient would like to know if PCP can prescribe her Prednisone because her asthma is flaring up.  Patient use [facility] on [address].   Patient was inform her PCP is out until Next Monday.

## 2017-11-15 MED ORDER — PREDNISONE 20 MG PO TABS: 20.0000 mg | ORAL_TABLET | Freq: Every day | ORAL | 0 refills | Status: DC

## 2017-11-15 NOTE — Telephone Encounter (Signed)
CMA spoke to patient to inform her new Rx. Patient understood.

## 2017-11-15 NOTE — Telephone Encounter (Signed)
I have sent a prescription to the pharmacy; the prednisone should help both her asthma and eczema.

## 2017-12-02 ENCOUNTER — Other Ambulatory Visit: Payer: BLUE CROSS/BLUE SHIELD | Admitting: Internal Medicine

## 2018-01-17 DIAGNOSIS — L280 Lichen simplex chronicus: Secondary | ICD-10-CM | POA: Diagnosis not present

## 2018-04-25 ENCOUNTER — Telehealth: Payer: Self-pay | Admitting: Internal Medicine

## 2018-04-25 NOTE — Telephone Encounter (Signed)
1) Medication(s) Requested (by name):predniSONE (DELTASONE) 20 MG tablet [[id number]]    2) Pharmacy of Choice: [facility] [place], [place] - [address] 3) Special Requests:   Approved medications will be sent to the pharmacy, we will reach out if there is an issue.  Requests made after 3pm may not be addressed until the following business day!  If a patient is unsure of the name of the medication(s) please note and ask patient to call back when they are able to provide all info, do not send to responsible party until all information is available!

## 2018-04-25 NOTE — Telephone Encounter (Signed)
Pt last rec prednisone in 10/2017. Last seen in clinic 10/15/17. Needs appointment.

## 2018-04-28 NOTE — Telephone Encounter (Signed)
Schedule an appointment.

## 2018-07-03 ENCOUNTER — Other Ambulatory Visit: Payer: Self-pay

## 2018-07-03 ENCOUNTER — Ambulatory Visit: Payer: BLUE CROSS/BLUE SHIELD | Attending: Internal Medicine | Admitting: Physician Assistant

## 2018-07-03 VITALS — BP 98/62 | HR 81 | Temp 98.5°F | Ht 69.0 in | Wt 214.6 lb

## 2018-07-03 DIAGNOSIS — R600 Localized edema: Secondary | ICD-10-CM

## 2018-07-03 DIAGNOSIS — J329 Chronic sinusitis, unspecified: Secondary | ICD-10-CM

## 2018-07-03 DIAGNOSIS — R609 Edema, unspecified: Secondary | ICD-10-CM | POA: Diagnosis not present

## 2018-07-03 DIAGNOSIS — J339 Nasal polyp, unspecified: Secondary | ICD-10-CM

## 2018-07-03 DIAGNOSIS — J4540 Moderate persistent asthma, uncomplicated: Secondary | ICD-10-CM

## 2018-07-03 DIAGNOSIS — R430 Anosmia: Secondary | ICD-10-CM

## 2018-07-03 MED ORDER — PREDNISONE 20 MG PO TABS: 20.0000 mg | ORAL_TABLET | Freq: Every day | ORAL | 0 refills | Status: DC

## 2018-07-03 MED ORDER — FUROSEMIDE 20 MG PO TABS
20.0000 mg | ORAL_TABLET | Freq: Every day | ORAL | 3 refills | Status: DC
Start: 2018-07-03 — End: 2018-07-03

## 2018-07-03 MED ORDER — MONTELUKAST SODIUM 10 MG PO TABS: 10.0000 mg | ORAL_TABLET | Freq: Every day | ORAL | 6 refills | Status: DC

## 2018-07-03 MED ORDER — MOMETASONE FURO-FORMOTEROL FUM 200-5 MCG/ACT IN AERO: 2.0000 | INHALATION_SPRAY | Freq: Two times a day (BID) | RESPIRATORY_TRACT | 12 refills | Status: DC

## 2018-07-03 MED ORDER — FUROSEMIDE 20 MG PO TABS: 20.0000 mg | ORAL_TABLET | Freq: Every day | ORAL | 0 refills | Status: DC

## 2018-07-03 MED ORDER — ALBUTEROL SULFATE HFA 108 (90 BASE) MCG/ACT IN AERS: 2.0000 | INHALATION_SPRAY | Freq: Four times a day (QID) | RESPIRATORY_TRACT | 12 refills | Status: DC | PRN

## 2018-07-03 NOTE — Progress Notes (Signed)
Patient ID: Janice Blake, female   DOB: 04-02-1971, 48 y.o.   MRN: [id number]   Janice Blake, is a 48 y.o. female  YEM:[id number]  PNP:[id number]  DOB - February 03, 1971  Subjective:  Chief Complaint and HPI: Janice Blake is a 48 y.o. female here today with multiple complaints.  Swelling in legs at end of work day.  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  Doesn't exercise regularly.  No CP/SOB.  No calf pain.  This has been occurring for months.    Also needs RF on all asthma meds.  Out for ~ 3 weeks.  Wheezing is bad.  No fever/infection.    Also c/o chronic sinus congestion and anosmia that has occurred when she had nasal polyps previously.    ROS:   Constitutional:  No f/c, No night sweats, No unexplained weight loss. EENT:  No vision changes, No blurry vision, No hearing changes.  Respiratory: + cough and wheezing, No SOB Cardiac: No CP, no palpitations GI:  No abd pain, No N/V/D. GU: No Urinary s/sx Musculoskeletal: No joint pain Neuro: No headache, no dizziness, no motor weakness.  Skin: No rash Endocrine:  No polydipsia. No polyuria.  Psych: Denies SI/HI  No problems updated.  ALLERGIES: Allergies  Allergen Reactions  . Amoxicillin Hives and Itching  . Levaquin [Levofloxacin In D5w] Hives and Itching    PAST MEDICAL HISTORY: Past Medical History:  Diagnosis Date  . Anemia   . Asthma   . 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 Fracture of right tibia   . Nasal polyps   . Shortness of breath    "just related to the asthma" (05/05/2013)    MEDICATIONS AT HOME: Prior to Admission medications   Medication Sig Start Date End Date Taking? Authorizing Provider  albuterol (PROVENTIL HFA;VENTOLIN HFA) 108 (90 Base) MCG/ACT inhaler Inhale 2 puffs into the lungs every 6 (six) hours as needed for wheezing or shortness of breath. 07/03/18  Yes Anders Simmonds, PA-C  ferrous sulfate 325 (65 FE) MG tablet Take 1  tablet (325 mg total) by mouth 2 (two) times daily with a meal. 10/15/17  Yes Marcine Matar, MD  fluticasone (FLONASE) 50 MCG/ACT nasal spray USE TWO SPRAY(S) IN EACH NOSTRIL ONCE DAILY 07/09/16  Yes Funches, Josalyn, MD  mometasone-formoterol (DULERA) 200-5 MCG/ACT AERO Inhale 2 puffs into the lungs 2 (two) times daily. 07/03/18  Yes ,  M, PA-C  montelukast (SINGULAIR) 10 MG tablet Take 1 tablet (10 mg total) by mouth at bedtime. 07/03/18  Yes Georgian Co M, PA-C  triamcinolone cream (KENALOG) 0.1 % Apply 1 application topically 2 (two) times daily. 10/15/17  Yes Marcine Matar, MD  albuterol (PROVENTIL) (2.5 MG/3ML) 0.083% nebulizer solution USE ONE VIAL IN NEBULIZER EVERY 4 HOURS AS NEEDED FOR WHEEZING OR SHORTNESS OF BREATH Patient not taking: Reported on 10/15/2017 07/09/16   Dessa Phi, MD  furosemide (LASIX) 20 MG tablet Take 1 tablet (20 mg total) by mouth daily. X 5 days for swelling 07/03/18   Georgian Co M, PA-C  predniSONE (DELTASONE) 20 MG tablet Take 1 tablet (20 mg total) by mouth daily with breakfast. 07/03/18   Anders Simmonds, PA-C     Objective:  EXAM:   Vitals:   07/03/18 1503  BP: 98/62  Pulse: 81  Temp: 98.5 F (36.9 C)  TempSrc: Oral  SpO2: 99%  Weight: 214 lb 9.6 oz (97.3 kg)  Height: 5\' 9"  (1.753 m)    General appearance : A&OX3. NAD. Non-toxic-appearing HEENT: Atraumatic and Normocephalic.  PERRLA. EOM intact.  TM clear B. Mouth-MMM, post pharynx WNL w/o erythema, No PND.  Turbinates inflamed Neck: supple, no JVD. No cervical lymphadenopathy. No thyromegaly Chest/Lungs:  Breathing-non-labored, Good air entry bilaterally, breath sounds without rales and rhonchi.  There is moderate wheezing throughout.   CVS: S1 S2 regular, no murmurs, gallops, rubs  Extremities: Bilateral Lower Ext shows 1+ edema, both legs are warm to touch with = pulse throughout.  Neg Homan's B.  No calf erythema Neurology:  CN II-XII grossly intact, Non  focal.   Psych:  TP linear. J/I WNL. Normal speech. Appropriate eye contact and affect.  Skin:  No Rash  Data Review No results found for: HGBA1C   Assessment & Plan   1. Edema, unspecified type Elevate, Ted stockings, increase exercise and water intake - furosemide (LASIX) 20 MG tablet; Take 1 tablet (20 mg total) by mouth daily. X 5 days for swelling  Dispense: 5 tablet; Refill: 0 - Comprehensive metabolic panel - CBC with Differential/Platelet - TSH  2. Moderate persistent asthma without complication - mometasone-formoterol (DULERA) 200-5 MCG/ACT AERO; Inhale 2 puffs into the lungs 2 (two) times daily.  Dispense: 13 g; Refill: 12 - montelukast (SINGULAIR) 10 MG tablet; Take 1 tablet (10 mg total) by mouth at bedtime.  Dispense: 30 tablet; Refill: 6 - albuterol (PROVENTIL HFA;VENTOLIN HFA) 108 (90 Base) MCG/ACT inhaler; Inhale 2 puffs into the lungs every 6 (six) hours as needed for wheezing or shortness of breath.  Dispense: 1 Inhaler; Refill: 12 - predniSONE (DELTASONE) 20 MG tablet; Take 1 tablet (20 mg total) by mouth daily with breakfast.  Dispense: 5 tablet; Refill: 0  3. Chronic congestion of paranasal sinus - predniSONE (DELTASONE) 20 MG tablet; Take 1 tablet (20 mg total) by mouth daily with breakfast.  Dispense: 5 tablet; Refill: 0 - Ambulatory referral to ENT  4. Nasal polyps - Ambulatory referral to ENT  5. Anosmia - Ambulatory referral to ENT     Patient have been counseled extensively about nutrition and exercise  Return in about 3 months (around 10/03/2018) for Dr Laural Benes; chronic medical issues.  The patient was given clear instructions to go to ER or return to medical center if symptoms don't improve, worsen or new problems develop. The patient verbalized understanding. The patient was told to call to get lab results if they haven't heard anything in the next week.     Georgian Co, PA-C [facility] [place], [place]  [phone]   07/03/2018, 3:25 PM

## 2018-07-04 LAB — COMPREHENSIVE METABOLIC PANEL
A/G RATIO: 0.9 — AB (ref 1.2–2.2)
ALT: 13 IU/L (ref 0–32)
AST: 20 IU/L (ref 0–40)
Albumin: 3.5 g/dL — ABNORMAL LOW (ref 3.8–4.8)
Alkaline Phosphatase: 126 IU/L — ABNORMAL HIGH (ref 39–117)
BILIRUBIN TOTAL: 0.3 mg/dL (ref 0.0–1.2)
BUN/Creatinine Ratio: 7 — ABNORMAL LOW (ref 9–23)
BUN: 7 mg/dL (ref 6–24)
CHLORIDE: 99 mmol/L (ref 96–106)
CO2: 21 mmol/L (ref 20–29)
Calcium: 8.4 mg/dL — ABNORMAL LOW (ref 8.7–10.2)
Creatinine, Ser: 0.94 mg/dL (ref 0.57–1.00)
GFR calc Af Amer: 83 mL/min/{1.73_m2} (ref >59)
GFR, EST NON AFRICAN AMERICAN: 72 mL/min/{1.73_m2} (ref >59)
GLOBULIN, TOTAL: 3.8 g/dL (ref 1.5–4.5)
Glucose: 81 mg/dL (ref 65–99)
POTASSIUM: 3.3 mmol/L — AB (ref 3.5–5.2)
SODIUM: 136 mmol/L (ref 134–144)
TOTAL PROTEIN: 7.3 g/dL (ref 6.0–8.5)

## 2018-07-04 LAB — CBC WITH DIFFERENTIAL/PLATELET
BASOS ABS: 0.1 10*3/uL (ref 0.0–0.2)
BASOS: 1 %
EOS (ABSOLUTE): 0.4 10*3/uL (ref 0.0–0.4)
Eos: 6 %
Hematocrit: 25.9 % — ABNORMAL LOW (ref 34.0–46.6)
Hemoglobin: 8 g/dL — ABNORMAL LOW (ref 11.1–15.9)
IMMATURE GRANS (ABS): 0.2 10*3/uL — AB (ref 0.0–0.1)
Immature Granulocytes: 2 %
LYMPHS: 23 %
Lymphocytes Absolute: 1.5 10*3/uL (ref 0.7–3.1)
MCH: 21.8 pg — AB (ref 26.6–33.0)
MCHC: 30.9 g/dL — AB (ref 31.5–35.7)
MCV: 71 fL — AB (ref 79–97)
MONOS ABS: 0.6 10*3/uL (ref 0.1–0.9)
Monocytes: 9 %
NEUTROS ABS: 4 10*3/uL (ref 1.4–7.0)
NRBC: 2 % — ABNORMAL HIGH (ref 0–0)
Neutrophils: 59 %
Platelets: 458 10*3/uL — ABNORMAL HIGH (ref 150–450)
RBC: 3.67 x10E6/uL — ABNORMAL LOW (ref 3.77–5.28)
RDW: 18.3 % — AB (ref 11.7–15.4)
WBC: 6.7 10*3/uL (ref 3.4–10.8)

## 2018-07-04 LAB — TSH: TSH: 1.21 u[IU]/mL (ref 0.450–4.500)

## 2018-07-06 ENCOUNTER — Other Ambulatory Visit: Payer: Self-pay | Admitting: Physician Assistant

## 2018-07-06 MED ORDER — POTASSIUM CHLORIDE CRYS ER 10 MEQ PO TBCR: 10.0000 meq | EXTENDED_RELEASE_TABLET | Freq: Every day | ORAL | 1 refills | Status: DC

## 2018-07-07 ENCOUNTER — Telehealth: Payer: Self-pay

## 2018-07-07 NOTE — Telephone Encounter (Signed)
-----   Message from Anders Simmonds, [place] sent at 07/06/2018 12:13 PM EDT ----- Please call patient. Her potassium is low.  I sent her a prescription to our pharmacy to take once daily.  Follow-up as planned.  Thanks, Georgian Co, PA-C

## 2018-07-07 NOTE — Telephone Encounter (Signed)
CMA attempt to reach patient to inform on results.  No answer and left a VM.  

## 2018-07-07 NOTE — Telephone Encounter (Signed)
A letter will be send out to reach patient  

## 2018-07-14 ENCOUNTER — Telehealth: Payer: Self-pay | Admitting: Internal Medicine

## 2018-07-14 MED ORDER — POTASSIUM CHLORIDE CRYS ER 10 MEQ PO TBCR: 10.0000 meq | EXTENDED_RELEASE_TABLET | Freq: Every day | ORAL | 1 refills | Status: DC

## 2018-07-14 NOTE — Telephone Encounter (Signed)
New Message   Pt states she is calling in because she received a letter about her results. Please f/u

## 2018-07-14 NOTE — Telephone Encounter (Signed)
Patient called back and was inform lab results and Rx.  Pt. [facility] the [facility] [address].  Rx sent.

## 2018-09-17 ENCOUNTER — Telehealth: Payer: Self-pay | Admitting: Internal Medicine

## 2018-09-17 ENCOUNTER — Other Ambulatory Visit: Payer: Self-pay | Admitting: Internal Medicine

## 2018-09-17 DIAGNOSIS — J329 Chronic sinusitis, unspecified: Secondary | ICD-10-CM

## 2018-09-17 DIAGNOSIS — J4540 Moderate persistent asthma, uncomplicated: Secondary | ICD-10-CM

## 2018-09-17 MED ORDER — PREDNISONE 20 MG PO TABS: 20.0000 mg | ORAL_TABLET | Freq: Every day | ORAL | 0 refills | Status: DC

## 2018-09-17 NOTE — Telephone Encounter (Signed)
New Message  Pt states she has a sore on her ankle and is draining clear puss

## 2018-09-17 NOTE — Telephone Encounter (Signed)
New Message   1) Medication(s) Requested (by name): predniSONE (DELTASONE) 20 MG tablet  2) Pharmacy of Choice: [facility] on [address]  3) Special Requests:   Approved medications will be sent to the pharmacy, we will reach out if there is an issue.  Requests made after 3pm may not be addressed until the following business day!  If a patient is unsure of the name of the medication(s) please note and ask patient to call back when they are able to provide all info, do not send to responsible party until all information is available!

## 2018-09-17 NOTE — Telephone Encounter (Signed)
Patients call returned.  Patient identified by name and date of birth.  Patient called with a complaint of a sore that has developed on the right outer ankle.  Patient has been taking lasix without relief of swelling.  Patient states the sore developed in March and it has progressively gotten worse.  Patient states that the sore is open ad weeping clear liquid.  Patient has been cleaning and performing wound management with OTC medications.  Patient states site is both itching and painful,  Patient states pain is 4/10.  Patient denies any COVID-19 symptoms.    Patient would like a webex visit as she is reluctant to come to office due to COVID-19 and he preexisting conditions.  Patient made an appointment for Thursday 09/18/2018.  Patient acknowledged understanding of advice.

## 2018-09-18 ENCOUNTER — Other Ambulatory Visit: Payer: Self-pay

## 2018-09-18 ENCOUNTER — Ambulatory Visit: Payer: BLUE CROSS/BLUE SHIELD | Attending: Internal Medicine | Admitting: Internal Medicine

## 2018-09-18 DIAGNOSIS — L309 Dermatitis, unspecified: Secondary | ICD-10-CM

## 2018-09-18 DIAGNOSIS — L308 Other specified dermatitis: Secondary | ICD-10-CM | POA: Diagnosis not present

## 2018-09-18 MED ORDER — TRIAMCINOLONE ACETONIDE 0.1 % EX CREA: 1.0000 "application " | TOPICAL_CREAM | Freq: Two times a day (BID) | CUTANEOUS | 1 refills | Status: DC

## 2018-09-18 MED ORDER — SULFAMETHOXAZOLE-TRIMETHOPRIM 400-80 MG PO TABS
1.0000 | ORAL_TABLET | Freq: Two times a day (BID) | ORAL | 0 refills | Status: DC
Start: 2018-09-18 — End: 2020-04-08

## 2018-09-18 NOTE — Progress Notes (Signed)
Pt states she is not having any pain it's more tender

## 2018-09-18 NOTE — Progress Notes (Signed)
Virtual Visit via Video Note This visit is being done as a virtual video per patient request.  She had called in complaining of a sore on the right ankle.  I had requested that this be an in person visit but patient declined an in person visit and insisted on having a virtual visit because she does not want to take the chance of getting infected with COVID.  She takes care of her  2 and 49 yr old grand-daughters.    I connected with Janice Blake on 09/18/18 at 1:57 p.m  by a video enabled telemedicine application and verified that I am speaking with the correct person using two identifiers. I am in my office.  The patient is at home.  Only the patient and myself participated in this encounter.  I discussed the limitations of evaluation and management by telemedicine and the availability of in person appointments. The patient expressed understanding and agreed to proceed.  History of Present Illness: Pt with hx of asthma, IDA  Patient had called in yesterday complaining of having a sore that developed above the right ankle lateral aspect.  Patient states that it started to develop in March around the time that she saw the PA and it has been gotten progressively worse.  It is associated with swelling in this leg. "I tried to show it to the young lady but she she really did not look at it." -Denies any injury to the leg or any bug bites around the time that the sore started.  - She does a lot of sitting during the day and she works from home. -Has rod and screws in RT leg from tibial bone to ankle in 1996 -rash is itchy and drains clear fluid.  Very sore and warm around that area.   Swelling in RT leg at end of the day.  Taking the lasix that was prescribed in March but still has swelling.  Can see indentation when she presses on leg. Feels fluid surrounding the rash -no fever. Saw derm last yr for rash on LT leg.  Dx with lichen planus and told to continue using the steroid cream that I had  prescribed.  She has not tried using the triamcinolone cream on this rash   Observations/Objective: Patient did send some photos of the rash to my CMA via Outlook.  The rash has a silvery appearance to it with what looks like some excoriated marks one area of clear fluid drainage.  The surrounding soft tissue looks like wax paper.  Assessment and Plan:  1. Dermatitis Questionably an allergic type dermatitis versus associated with venous stasis and early infection.  Evaluation is somewhat limited since this is not an in person visit.  I recommend keeping the leg elevated as much as possible.  Advised to clean the rash twice daily and then apply triamcinolone cream.  We will also start Bactrim for any surrounding infection.  Once this resolved I recommend wearing compression socks -If no improvement we will plan to refer to dermatology. At the end of the encounter patient states that she would like to come in sometime next week for an in person visit for me to take a look at it but she would need to arrange care of the 2 grandchildren with her son.  I told her I will have my CMA call her with an appointment - triamcinolone cream (KENALOG) 0.1 %; Apply 1 application topically 2 (two) times daily.  Dispense: 30 g; Refill: 1 - sulfamethoxazole-trimethoprim (BACTRIM)  400-80 MG tablet; Take 1 tablet by mouth 2 (two) times daily.  Dispense: 14 tablet; Refill: 0  Follow Up Instructions: Patient to follow-up with me in person sometime next week   I discussed the assessment and treatment plan with the patient. The patient was provided an opportunity to ask questions and all were answered. The patient agreed with the plan and demonstrated an understanding of the instructions.   The patient was advised to call back or seek an in-person evaluation if the symptoms worsen or if the condition fails to improve as anticipated.  I provided 20 minutes of non-face-to-face time during this encounter.   Jonah Blueeborah  Chancey Cullinane, MD

## 2018-09-23 ENCOUNTER — Ambulatory Visit: Payer: BLUE CROSS/BLUE SHIELD | Admitting: Internal Medicine

## 2018-10-03 ENCOUNTER — Ambulatory Visit: Payer: BLUE CROSS/BLUE SHIELD | Admitting: Internal Medicine

## 2018-12-23 ENCOUNTER — Other Ambulatory Visit: Payer: Self-pay | Admitting: Internal Medicine

## 2018-12-23 DIAGNOSIS — L309 Dermatitis, unspecified: Secondary | ICD-10-CM

## 2019-02-13 ENCOUNTER — Other Ambulatory Visit: Payer: Self-pay | Admitting: Physician Assistant

## 2019-02-13 DIAGNOSIS — J4540 Moderate persistent asthma, uncomplicated: Secondary | ICD-10-CM

## 2019-03-26 ENCOUNTER — Other Ambulatory Visit: Payer: Self-pay | Admitting: Internal Medicine

## 2019-03-26 ENCOUNTER — Telehealth: Payer: Self-pay | Admitting: Internal Medicine

## 2019-03-26 DIAGNOSIS — J4540 Moderate persistent asthma, uncomplicated: Secondary | ICD-10-CM

## 2019-03-26 DIAGNOSIS — J329 Chronic sinusitis, unspecified: Secondary | ICD-10-CM

## 2019-03-26 MED ORDER — PREDNISONE 20 MG PO TABS: 20.0000 mg | ORAL_TABLET | Freq: Every day | ORAL | 0 refills | Status: DC

## 2019-03-26 MED ORDER — MONTELUKAST SODIUM 10 MG PO TABS: 10.0000 mg | ORAL_TABLET | Freq: Every day | ORAL | 0 refills | Status: DC

## 2019-03-26 NOTE — Telephone Encounter (Signed)
1) Medication(s) Requested (by name): montelukast (SINGULAIR) 10 MG tablet [[id number]]   predniSONE (DELTASONE) 20 MG tablet [[id number]]    2) Pharmacy of Choice:  [place] ([place]), [place] - [address]   Approved medications will be sent to pharmacy, we will reach out to you if there is an issue.  Requests made after 3pm may not be addressed until following business day!

## 2019-03-26 NOTE — Telephone Encounter (Signed)
Will forward to pcp

## 2019-04-27 ENCOUNTER — Other Ambulatory Visit: Payer: Self-pay | Admitting: Internal Medicine

## 2019-04-27 DIAGNOSIS — J4540 Moderate persistent asthma, uncomplicated: Secondary | ICD-10-CM

## 2019-05-08 ENCOUNTER — Other Ambulatory Visit: Payer: Self-pay

## 2019-05-08 ENCOUNTER — Ambulatory Visit: Payer: BC Managed Care – PPO | Attending: Family | Admitting: Family

## 2019-05-08 DIAGNOSIS — J4530 Mild persistent asthma, uncomplicated: Secondary | ICD-10-CM | POA: Diagnosis not present

## 2019-05-08 DIAGNOSIS — L308 Other specified dermatitis: Secondary | ICD-10-CM | POA: Diagnosis not present

## 2019-05-08 DIAGNOSIS — L309 Dermatitis, unspecified: Secondary | ICD-10-CM

## 2019-05-08 MED ORDER — DULERA 200-5 MCG/ACT IN AERO: 2.0000 | INHALATION_SPRAY | Freq: Two times a day (BID) | RESPIRATORY_TRACT | 0 refills | Status: DC

## 2019-05-08 MED ORDER — TRIAMCINOLONE ACETONIDE 0.1 % EX CREA: 1.0000 "application " | TOPICAL_CREAM | Freq: Two times a day (BID) | CUTANEOUS | 0 refills | Status: DC

## 2019-05-08 MED ORDER — PREDNISONE 20 MG PO TABS: 20.0000 mg | ORAL_TABLET | Freq: Every day | ORAL | 0 refills | Status: DC

## 2019-05-08 MED ORDER — MONTELUKAST SODIUM 10 MG PO TABS: 10.0000 mg | ORAL_TABLET | Freq: Every day | ORAL | 2 refills | Status: DC

## 2019-05-08 MED ORDER — ALBUTEROL SULFATE HFA 108 (90 BASE) MCG/ACT IN AERS: INHALATION_SPRAY | RESPIRATORY_TRACT | 0 refills | Status: DC

## 2019-05-08 NOTE — Progress Notes (Addendum)
Patient ID: Janice Blake, female    DOB: 12-Apr-1971  MRN: [id number]  CC: Medication Refill   Subjective: Janice Blake is a 49 y.o. female with a primary history of asthma, allergic rhinitis, nasal polyps, iron deficiency anemia, and back muscle spasms who presents for medication refill. 1. Asthma:  Currently taking: see med list Taking albuterol MDI? Yes Taking nebulized albuterol? No Taking controller medication? Yes Frequency of rescue medication use: 1 time per month Med Adherence: Yes Recent wheezing? No Shortness of breath? No Cough? No Recent exacerbations/ER visits/hospitalizations:No  Asthma Triggers: Certain foods such as crab cheese, strong smells, and stress Activity limitations? No Comments: Diagnosed with asthma since 1999. Has not taken Singulair for 2 weeks. As a result nasal polyps have began to given her trouble which has caused trouble sleeping at night during the last 2 weeks.Admits decreased smell related to nasal polyps. Had surgery for nasal polyps years ago and has plans to get the surgery again one day. Denies chest pain. Admits chest tightness if she doesn't take medications. Denies shortness of breath. Denies cough. Denies fever. Denies chills. Denies congestion. Denies smoking. Denies peak flow use. Admits sneezing a lot. Exercises 30 minutes per week.  2. Dermatitis: Left leg itching. Scratches for relief. States will send a picture via email of dermatitis to provider.   Patient Active Problem List   Diagnosis Date Noted  . Overweight 10/15/2017  . Back muscle spasm 01/22/2014  . Menorrhagia with regular cycle 11/30/2013  . Anemia, iron deficiency 11/30/2013  . History of nasal polyp 11/30/2013  . Allergic rhinitis 05/05/2013  . Asthma      Current Outpatient Medications on File Prior to Visit  Medication Sig Dispense Refill  . albuterol (PROVENTIL HFA;VENTOLIN HFA) 108 (90 Base) MCG/ACT inhaler Inhale 2 puffs into the lungs every 6 (six)  hours as needed for wheezing or shortness of breath. 1 Inhaler 12  . albuterol (PROVENTIL) (2.5 MG/3ML) 0.083% nebulizer solution USE ONE VIAL IN NEBULIZER EVERY 4 HOURS AS NEEDED FOR WHEEZING OR SHORTNESS OF BREATH (Patient not taking: Reported on 10/15/2017) 75 mL 6  . ferrous sulfate 325 (65 FE) MG tablet Take 1 tablet (325 mg total) by mouth 2 (two) times daily with a meal. 100 tablet 1  . fluticasone (FLONASE) 50 MCG/ACT nasal spray USE TWO SPRAY(S) IN EACH NOSTRIL ONCE DAILY 16 g 5  . furosemide (LASIX) 20 MG tablet Take 1 tablet (20 mg total) by mouth daily. X 5 days for swelling (Patient not taking: Reported on 09/18/2018) 5 tablet 0  . mometasone-formoterol (DULERA) 200-5 MCG/ACT AERO Inhale 2 puffs into the lungs 2 (two) times daily. 13 g 12  . montelukast (SINGULAIR) 10 MG tablet Take 1 tablet (10 mg total) by mouth at bedtime. Must have office visit for refills 30 tablet 0  . potassium chloride (K-DUR,KLOR-CON) 10 MEQ tablet Take 1 tablet (10 mEq total) by mouth daily. 30 tablet 1  . predniSONE (DELTASONE) 20 MG tablet Take 1 tablet (20 mg total) by mouth daily with breakfast. 5 tablet 0  . sulfamethoxazole-trimethoprim (BACTRIM) 400-80 MG tablet Take 1 tablet by mouth 2 (two) times daily. 14 tablet 0  . triamcinolone cream (KENALOG) 0.1 % Apply topically 2 (two) times daily. Must have office visit for refills 30 g 0   No current facility-administered medications on file prior to visit.    Allergies  Allergen Reactions  . Amoxicillin Hives and Itching  . Levaquin [Levofloxacin In D5w] Hives and Itching  Social History   Socioeconomic History  . Marital status: Single    Spouse name: Not on file  . Number of children: Not on file  . Years of education: Not on file  . Highest education level: Not on file  Occupational History  . Not on file  Tobacco Use  . Smoking status: Never Smoker  . Smokeless tobacco: Never Used  Substance and Sexual Activity  . Alcohol use: No  .  Drug use: No  . Sexual activity: Not Currently  Other Topics Concern  . 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 3/4 sons live in [place]            Social Determinants of Health   Financial Resource Strain:   . Difficulty of Paying Living Expenses: Not on file  Food Insecurity:   . Worried About Programme researcher, broadcasting/film/video in the Last Year: Not on file  . Ran Out of Food in the Last Year: Not on file  Transportation Needs:   . Lack of Transportation (Medical): Not on file  . Lack of Transportation (Non-Medical): Not on file  Physical Activity:   . Days of Exercise per Week: Not on file  . Minutes of Exercise per Session: Not on file  Stress:   . Feeling of Stress : Not on file  Social Connections:   . Frequency of Communication with Friends and Family: Not on file  . Frequency of Social Gatherings with Friends and Family: Not on file  . Attends Religious Services: Not on file  . Active Member of Clubs or Organizations: Not on file  . Attends Banker Meetings: Not on file  . Marital Status: Not on file  Intimate Partner Violence:   . Fear of Current or Ex-Partner: Not on file  . Emotionally Abused: Not on file  . Physically Abused: Not on file  . Sexually Abused: Not on file    Family History  Problem Relation Age of Onset  . Hypertension Father   . Asthma Son   . Diabetes Maternal Grandmother   . Hypertension Maternal Grandfather   . Cancer Paternal Grandmother   . Cancer Paternal Grandfather        lung cancer     Past Surgical History:  Procedure Laterality Date  . FRACTURE SURGERY    . NASAL POLYP SURGERY Bilateral 2011  . NASAL SINUS SURGERY Bilateral 11/03/2014   Procedure: BILATERAL ENDOSCOPIC POLYPECTOMY ETHMOIDECTOMY MAXILLARY ANTROSTOMY ;  Surgeon: Serena Colonel, MD;  Location: [facility];  Service:  ENT;  Laterality: Bilateral;  . PATELLA FRACTURE SURGERY Right 1996   "crushed; rod w/12 screws on one side, 6 screws on the other"  . TUBAL LIGATION  1994    ROS: Negative except as stated above.  PHYSICAL EXAM: There were no vitals taken for this visit.  Physical Exam General appearance - alert, well appearing, and in no distress and oriented to person, place, and time Mental status - alert, oriented to person, place, and time, normal mood, behavior, speech, dress, motor activity, and thought processes Skin - DERMATITIS NOTED: left lateral leg, skin darkened at area of dermatitis   CMP Latest Ref Rng & Units 07/03/2018 10/15/2017 05/05/2013  Glucose 65 - 99 mg/dL 81 90 92  BUN 6 - 24 mg/dL 7  8 7  Creatinine 0.57 - 1.00 mg/dL 3.25 4.98(Y) 6.41  Sodium 134 - 144 mmol/L 136 137 137  Potassium 3.5 - 5.2 mmol/L 3.3(L) 3.6 4.8  Chloride 96 - 106 mmol/L 99 104 101  CO2 20 - 29 mmol/L 21 21 22   Calcium 8.7 - 10.2 mg/dL ) 8.8 8.5  Total Protein 6.0 - 8.5 g/dL 7.3 7.1 -  Total Bilirubin 0.0 - 1.2 mg/dL 0.3 5.8(X -  Alkaline Phos 39 - 117 IU/L 126(H) 109 -  AST 0 - 40 IU/L 20 10 -  ALT 0 - 32 IU/L 13 7 -   Lipid Panel     Component Value Date/Time   CHOL 167 10/15/2017 1047   TRIG 123 10/15/2017 1047   HDL 47 10/15/2017 1047   CHOLHDL 3.6 10/15/2017 1047   LDLCALC 95 10/15/2017 1047    CBC    Component Value Date/Time   WBC 6.7 07/03/2018 1530   WBC 5.3 05/05/2013 1005   RBC 3.67 (L) 07/03/2018 1530   RBC 3.97 05/06/2013 0323   RBC 4.35 05/05/2013 1005   HGB 8.0 (L) 07/03/2018 1530   HCT 25.9 (L) 07/03/2018 1530   PLT 458 (H) 07/03/2018 1530   MCV 71 (L) 07/03/2018 1530   MCH 21.8 (L) 07/03/2018 1530   MCH 23.9 (L) 05/05/2013 1005   MCHC 30.9 (L) 07/03/2018 1530   MCHC 31.1 05/05/2013 1005   RDW 18.3 (H) 07/03/2018 1530   LYMPHSABS 1.5 07/03/2018 1530   MONOABS 0.5 05/05/2013 1005   EOSABS 0.4 07/03/2018 1530   BASOSABS 0.1 07/03/2018 1530    ASSESSMENT AND  PLAN: 1. ASTHMA:  -Albuterol (PROVENTIL HFA; VENTOLIN HFA) 108 (90 Base) MCG/ACT inhaler; Inhale 2 puffs into the lungs every 6 (six) hours as needed for wheezing or shortness of breath. -Albuterol (PROVENTIL) 2.5 MG/3 ML 0.083% nebulizer solution; USE ONE VIAL IN NEBULIZER EVERY 4 HOURS AS NEEDED FOR WHEEZING OR SHORTNESS OF BREATH -Mometasone-Formoterol (DULERA) 200-5 MCG/ACT AERO; Inhale 2 puffs into the lungs 2 (two) times daily -Montelukast (SINGULAIR); Take 1 tablet (10 mg total) by mouth at bedtime. -Prednisone (DELTASONE); Take 1 tablet (20 mg total) by mouth daily with breakfast.,  -Follow-up in about 3 months with attending physician for evaluation of asthma management plan  2. DERMATITIS: -Triamcinolone cream (KENALOG) 0.1%;  Apply topically 2 (two) times daily.  Patient was given the opportunity to ask questions.  Patient verbalized understanding of the plan and was able to repeat key elements of the plan.   No orders of the defined types were placed in this encounter.  This office appointment was a video encounter which lasted approximately 40 minutes counseling, providing treatment, and diagnosis of patient conditions.   Follow-Up: Return for medication refills with attending physician. Report to the emergency room for worsening and or severe symptoms. Patient verbalizes understanding.   Requested Prescriptions    No prescriptions requested or ordered in this encounter      Leng Montesdeoca 09/02/2018, NP

## 2019-05-09 MED ORDER — ALBUTEROL SULFATE HFA 108 (90 BASE) MCG/ACT IN AERS: 2.0000 | INHALATION_SPRAY | Freq: Four times a day (QID) | RESPIRATORY_TRACT | 0 refills | Status: DC | PRN

## 2019-05-09 NOTE — Addendum Note (Signed)
Addended by: Rema Fendt on: 05/09/2019 06:43 AM   Modules accepted: Orders

## 2019-06-22 ENCOUNTER — Other Ambulatory Visit: Payer: Self-pay | Admitting: Family

## 2019-06-22 DIAGNOSIS — J4530 Mild persistent asthma, uncomplicated: Secondary | ICD-10-CM

## 2019-06-23 ENCOUNTER — Telehealth: Payer: Self-pay | Admitting: Internal Medicine

## 2019-06-23 DIAGNOSIS — J4530 Mild persistent asthma, uncomplicated: Secondary | ICD-10-CM

## 2019-06-23 MED ORDER — PREDNISONE 20 MG PO TABS: 20.0000 mg | ORAL_TABLET | Freq: Every day | ORAL | 0 refills | Status: DC

## 2019-06-23 NOTE — Telephone Encounter (Signed)
Pt is requesting a refill   predniSONE (DELTASONE) 20 MG tablet  Pharmacy of choice is [facility] [address]

## 2019-08-03 ENCOUNTER — Other Ambulatory Visit: Payer: Self-pay | Admitting: Internal Medicine

## 2019-08-03 DIAGNOSIS — J4530 Mild persistent asthma, uncomplicated: Secondary | ICD-10-CM

## 2019-08-04 ENCOUNTER — Other Ambulatory Visit: Payer: Self-pay | Admitting: Family

## 2019-08-04 DIAGNOSIS — J4530 Mild persistent asthma, uncomplicated: Secondary | ICD-10-CM

## 2019-08-04 MED ORDER — PREDNISONE 20 MG PO TABS: 20.0000 mg | ORAL_TABLET | Freq: Every day | ORAL | 0 refills | Status: DC

## 2019-08-04 NOTE — Telephone Encounter (Signed)
Pt is requesting prednisone.

## 2019-10-10 ENCOUNTER — Other Ambulatory Visit: Payer: Self-pay | Admitting: Family

## 2019-10-10 DIAGNOSIS — J4530 Mild persistent asthma, uncomplicated: Secondary | ICD-10-CM

## 2019-10-12 ENCOUNTER — Other Ambulatory Visit: Payer: Self-pay | Admitting: Internal Medicine

## 2019-10-12 DIAGNOSIS — J4530 Mild persistent asthma, uncomplicated: Secondary | ICD-10-CM

## 2019-10-12 NOTE — Telephone Encounter (Signed)
Will forward to pcp

## 2019-10-12 NOTE — Telephone Encounter (Signed)
1) Medication(s) Requested (by name): mometasone-formoterol (DULERA) 200-5 MCG/ACT AERO    2) Pharmacy of Choice: [id number] - [place] ([place] - [address]  [address], [place] ([place])  [address]  3) Special Requests:   Approved medications will be sent to the pharmacy, we will reach out if there is an issue.  Requests made after 3pm may not be addressed until the following business day!  If a patient is unsure of the name of the medication(s) please note and ask patient to call back when they are able to provide all info, do not send to responsible party until all information is available!

## 2019-10-13 MED ORDER — PREDNISONE 20 MG PO TABS: 20.0000 mg | ORAL_TABLET | Freq: Every day | ORAL | 0 refills | Status: DC

## 2019-10-14 NOTE — Telephone Encounter (Signed)
Pt is scheduled for an appointment with pcp 6/24 at 1030 mychart

## 2019-10-15 ENCOUNTER — Ambulatory Visit: Payer: BC Managed Care – PPO | Attending: Internal Medicine | Admitting: Family

## 2019-10-15 ENCOUNTER — Other Ambulatory Visit: Payer: Self-pay

## 2019-10-15 DIAGNOSIS — J4540 Moderate persistent asthma, uncomplicated: Secondary | ICD-10-CM | POA: Diagnosis not present

## 2019-10-15 DIAGNOSIS — L309 Dermatitis, unspecified: Secondary | ICD-10-CM

## 2019-10-15 MED ORDER — MONTELUKAST SODIUM 10 MG PO TABS: 10.0000 mg | ORAL_TABLET | Freq: Every day | ORAL | 2 refills | Status: DC

## 2019-10-15 MED ORDER — ALBUTEROL SULFATE HFA 108 (90 BASE) MCG/ACT IN AERS: 2.0000 | INHALATION_SPRAY | Freq: Four times a day (QID) | RESPIRATORY_TRACT | 0 refills | Status: DC | PRN

## 2019-10-15 MED ORDER — DULERA 200-5 MCG/ACT IN AERO: 2.0000 | INHALATION_SPRAY | Freq: Two times a day (BID) | RESPIRATORY_TRACT | 2 refills | Status: DC

## 2019-10-15 MED ORDER — TRIAMCINOLONE ACETONIDE 0.1 % EX CREA: 1.0000 "application " | TOPICAL_CREAM | Freq: Two times a day (BID) | CUTANEOUS | 0 refills | Status: DC

## 2019-10-15 NOTE — Progress Notes (Signed)
Virtual Visit via Telephone Note  I connected with Janice Blake, on 10/15/2019 at 10:35 AM by telephone due to the COVID-19 pandemic and verified that I am speaking with the correct person using two identifiers.  Due to current restrictions/limitations of in-office visits due to the COVID-19 pandemic, this scheduled clinical appointment was converted to a telehealth visit.   Consent: I discussed the limitations, risks, security and privacy concerns of performing an evaluation and management service by telephone and the availability of in person appointments. I also discussed with the patient that there may be a patient responsible charge related to this service. The patient expressed understanding and agreed to proceed.  Location of Patient: Home  Location of Provider: [place] [facility] [place]  Persons participating in Telemedicine visit: Janice Castellanos, NP Janice Blake, [place]   History of Present Illness: Janice Blake is a 49 year old female with history of asthma, allergic rhinitis, menorrhagia with regular cycle, iron deficiency anemia, and back muscle spasm who presents for asthma follow-up.  1. ASTHMA FOLLOW-UP: Asthma status: better Satisfied with current treatment?: yes Albuterol/rescue inhaler frequency: Only when out of Dulera. This doesn't happen often. Dyspnea frequency: not often  Wheezing frequency: none Cough frequency: none Nocturnal symptom frequency: denies Limitation of activity: no Current upper respiratory symptoms: no Triggers: pollen, strong smell choc peanuts dairy cheese  Aerochamber/spacer use: yes Visits to ER or Urgent Care in past year: no Pneumovax: Up to Date -Last visit with me on 05/08/2019. During that encounter Albuterol inhaler, Albuterol nebulizer, Dulera, Singulair, and Prednisone filled.   2. DERMATITIS FOLLOW-UP: Today reports right ankle has healed, has a scar, and that Kenalog helped with management of this.  Reports left leg is currently healing but still itchy and feels Kenalog is helping with this as well. Reports she saw the dermatologist about 1 year ago and was prescribed Kenalog as well. Requests refill of Kenalog.  Past Medical History:  Diagnosis Date  . Anemia   . Asthma   . 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 Fracture of right tibia   . Nasal polyps   . Shortness of breath    "just related to the asthma" (05/05/2013)   Allergies  Allergen Reactions  . Amoxicillin Hives and Itching  . Levaquin [Levofloxacin In D5w] Hives and Itching    Current Outpatient Medications on File Prior to Visit  Medication Sig Dispense Refill  . albuterol (VENTOLIN HFA) 108 (90 Base) MCG/ACT inhaler Inhale 2 puffs into the lungs every 6 (six) hours as needed for wheezing or shortness of breath. 8 g 0  . DULERA 200-5 MCG/ACT AERO Inhale 2 puffs by mouth twice daily 13 g 0  . ferrous sulfate 325 (65 FE) MG tablet Take 1 tablet (325 mg total) by mouth 2 (two) times daily with a meal. 100 tablet 1  . fluticasone (FLONASE) 50 MCG/ACT nasal spray USE TWO SPRAY(S) IN EACH NOSTRIL ONCE DAILY 16 g 5  . furosemide (LASIX) 20 MG tablet Take 1 tablet (20 mg total) by mouth daily. X 5 days for swelling (Patient not taking: Reported on 09/18/2018) 5 tablet 0  . montelukast (SINGULAIR) 10 MG tablet TAKE 1 TABLET BY MOUTH AT BEDTIME 30 tablet 3  . potassium chloride (K-DUR,KLOR-CON) 10 MEQ tablet Take 1 tablet (10 mEq total) by mouth daily. 30 tablet 1  . predniSONE (DELTASONE) 20 MG tablet Take 1 tablet (20 mg total) by mouth daily with  breakfast. 5 tablet 0  . sulfamethoxazole-trimethoprim (BACTRIM) 400-80 MG tablet Take 1 tablet by mouth 2 (two) times daily. 14 tablet 0  . triamcinolone cream (KENALOG) 0.1 % Apply 1 application topically 2 (two) times daily. 30 g 0   No current facility-administered medications on file prior to visit.     Observations/Objective: Alert and oriented x 3. Not in acute distress. Physical examination not completed as this is a telemedicine visit.  Assessment and Plan: 1. Moderate persistent asthma without complication: -Patient doing well on current regimen of asthma medications. Reports she is feeling better overall and seldom uses her Albuterol rescue inhaler and she never uses the Albuterol nebulizer treatment. -Continue Montelukast, Mometasone-Formoterol, Prednisone, and Albuterol inhaler as prescribed. -Prednisone last prescribed on 10/13/2019. -Recommended daily use of peak flow meter. Discussed when she needs to be seen based on symptoms and decline in peak flow measurements. -Follow-up with primary physician in 3 months or sooner if needed. - montelukast (SINGULAIR) 10 MG tablet; Take 1 tablet (10 mg total) by mouth at bedtime.  Dispense: 30 tablet; Refill: 2 - mometasone-formoterol (DULERA) 200-5 MCG/ACT AERO; Inhale 2 puffs into the lungs 2 (two) times daily.  Dispense: 13 g; Refill: 2 - albuterol (VENTOLIN HFA) 108 (90 Base) MCG/ACT inhaler; Inhale 2 puffs into the lungs every 6 (six) hours as needed for wheezing or shortness of breath.  Dispense: 8 g; Refill: 0  2. Dermatitis: -Patient reports dermatitis is about the same since last visit. Reports she is trying to quit scratching her legs but sometimes this is difficult. -Reports she saw Dermatology about 1 year ago and was prescribed the same Triamcinolone cream as her primary physician was prescribing. -Continue Triamcinolone cream as prescribed.  -Follow-up with primary physician as needed. - triamcinolone cream (KENALOG) 0.1 %; Apply 1 application topically 2 (two) times daily.  Dispense: 30 g; Refill: 0  Follow Up Instructions: Follow-up with primary physician in 3 months or sooner if needed.   Patient was given clear instructions to go to Emergency Department or return to medical center if symptoms don't improve, worsen, or  new problems develop.The patient verbalized understanding.  I discussed the assessment and treatment plan with the patient. The patient was provided an opportunity to ask questions and all were answered. The patient agreed with the plan and demonstrated an understanding of the instructions.   The patient was advised to call back or seek an in-person evaluation if the symptoms worsen or if the condition fails to improve as anticipated.   I provided 15 minutes total of non-face-to-face time during this encounter including median intraservice time, reviewing previous notes, labs, imaging, medications, management and patient verbalized understanding.    Rema Fendt, NP  [facility] and [facility] [place], [place] [phone]   10/15/2019, 10:35 AM

## 2019-10-15 NOTE — Patient Instructions (Addendum)
Continue Albuterol, Dulera, and Singulair for asthma. Continue Kenalog for dermatitis. Follow-up with primary physician in 3 months or sooner if needed. Asthma, Adult  Asthma is a long-term (chronic) condition in which the airways get tight and narrow. The airways are the breathing passages that lead from the nose and mouth down into the lungs. A person with asthma will have times when symptoms get worse. These are called asthma attacks. They can cause coughing, whistling sounds when you breathe (wheezing), shortness of breath, and chest pain. They can make it hard to breathe. There is no cure for asthma, but medicines and lifestyle changes can help control it. There are many things that can bring on an asthma attack or make asthma symptoms worse (triggers). Common triggers include:  Mold.  Dust.  Cigarette smoke.  Cockroaches.  Things that can cause allergy symptoms (allergens). These include animal skin flakes (dander) and pollen from trees or grass.  Things that pollute the air. These may include household cleaners, wood smoke, smog, or chemical odors.  Cold air, weather changes, and wind.  Crying or laughing hard.  Stress.  Certain medicines or drugs.  Certain foods such as dried fruit, potato chips, and grape juice.  Infections, such as a cold or the flu.  Certain medical conditions or diseases.  Exercise or tiring activities. Asthma may be treated with medicines and by staying away from the things that cause asthma attacks. Types of medicines may include:  Controller medicines. These help prevent asthma symptoms. They are usually taken every day.  Fast-acting reliever or rescue medicines. These quickly relieve asthma symptoms. They are used as needed and provide short-term relief.  Allergy medicines if your attacks are brought on by allergens.  Medicines to help control the body's defense (immune) system. Follow these instructions at home: Avoiding triggers in your  home  Change your heating and air conditioning filter often.  Limit your use of fireplaces and wood stoves.  Get rid of pests (such as roaches and mice) and their droppings.  Throw away plants if you see mold on them.  Clean your floors. Dust regularly. Use cleaning products that do not smell.  Have someone vacuum when you are not home. Use a vacuum cleaner with a HEPA filter if possible.  Replace carpet with wood, tile, or vinyl flooring. Carpet can trap animal skin flakes and dust.  Use allergy-proof pillows, mattress covers, and box spring covers.  Wash bed sheets and blankets every week in hot water. Dry them in a dryer.  Keep your bedroom free of any triggers.  Avoid pets and keep windows closed when things that cause allergy symptoms are in the air.  Use blankets that are made of polyester or cotton.  Clean bathrooms and kitchens with bleach. If possible, have someone repaint the walls in these rooms with mold-resistant paint. Keep out of the rooms that are being cleaned and painted.  Wash your hands often with soap and water. If soap and water are not available, use hand sanitizer.  Do not allow anyone to smoke in your home. General instructions  Take over-the-counter and prescription medicines only as told by your doctor. ? Talk with your doctor if you have questions about how or when to take your medicines. ? Make note if you need to use your medicines more often than usual.  Do not use any products that contain nicotine or tobacco, such as cigarettes and e-cigarettes. If you need help quitting, ask your doctor.  Stay away from secondhand  smoke.  Avoid doing things outdoors when allergen counts are high and when air quality is low.  Wear a ski mask when doing outdoor activities in the winter. The mask should cover your nose and mouth. Exercise indoors on cold days if you can.  Warm up before you exercise. Take time to cool down after exercise.  Use a peak flow  meter as told by your doctor. A peak flow meter is a tool that measures how well the lungs are working.  Keep track of the peak flow meter's readings. Write them down.  Follow your asthma action plan. This is a written plan for taking care of your asthma and treating your attacks.  Make sure you get all the shots (vaccines) that your doctor recommends. Ask your doctor about a flu shot and a pneumonia shot.  Keep all follow-up visits as told by your doctor. This is important. Contact a doctor if:  You have wheezing, shortness of breath, or a cough even while taking medicine to prevent attacks.  The mucus you cough up (sputum) is thicker than usual.  The mucus you cough up changes from clear or white to yellow, green, gray, or bloody.  You have problems from the medicine you are taking, such as: ? A rash. ? Itching. ? Swelling. ? Trouble breathing.  You need reliever medicines more than 2-3 times a week.  Your peak flow reading is still at 50-79% of your personal best after following the action plan for 1 hour.  You have a fever. Get help right away if:  You seem to be worse and are not responding to medicine during an asthma attack.  You are short of breath even at rest.  You get short of breath when doing very little activity.  You have trouble eating, drinking, or talking.  You have chest pain or tightness.  You have a fast heartbeat.  Your lips or fingernails start to turn blue.  You are light-headed or dizzy, or you faint.  Your peak flow is less than 50% of your personal best.  You feel too tired to breathe normally. Summary  Asthma is a long-term (chronic) condition in which the airways get tight and narrow. An asthma attack can make it hard to breathe.  Asthma cannot be cured, but medicines and lifestyle changes can help control it.  Make sure you understand how to avoid triggers and how and when to use your medicines. This information is not intended to  replace advice given to you by your health care provider. Make sure you discuss any questions you have with your health care provider. Document Revised: 06/12/2018 Document Reviewed: 05/14/2016 Elsevier Patient Education  2020 [employer].

## 2019-11-14 ENCOUNTER — Other Ambulatory Visit: Payer: Self-pay | Admitting: Physician Assistant

## 2019-11-14 ENCOUNTER — Other Ambulatory Visit: Payer: Self-pay | Admitting: Internal Medicine

## 2019-11-14 DIAGNOSIS — R609 Edema, unspecified: Secondary | ICD-10-CM

## 2019-11-14 DIAGNOSIS — J4530 Mild persistent asthma, uncomplicated: Secondary | ICD-10-CM

## 2019-11-15 MED ORDER — PREDNISONE 20 MG PO TABS: 20.0000 mg | ORAL_TABLET | Freq: Every day | ORAL | 0 refills | Status: DC

## 2020-01-01 ENCOUNTER — Other Ambulatory Visit: Payer: Self-pay | Admitting: Internal Medicine

## 2020-01-01 DIAGNOSIS — J4530 Mild persistent asthma, uncomplicated: Secondary | ICD-10-CM

## 2020-01-01 NOTE — Telephone Encounter (Signed)
Requested medication (s) are due for refill today: no  Requested medication (s) are on the active medication list: yes  Last refill:  12/04/2019  Future visit scheduled: no  Notes to clinic: this refill cannot be delegated    Requested Prescriptions  Pending Prescriptions Disp Refills   predniSONE (DELTASONE) 20 MG tablet [Pharmacy Med Name: predniSONE 20 MG Oral Tablet] 5 tablet 0    Sig: TAKE 1 TABLET BY MOUTH  DAILY WITH BREAKFAST      Not Delegated - Endocrinology:  Oral Corticosteroids Failed - 01/01/2020  5:26 PM      Failed - This refill cannot be delegated      Passed - Last BP in normal range    BP Readings from Last 1 Encounters:  07/03/18 98/62          Passed - Valid encounter within last 6 months    Recent Outpatient Visits           2 months ago Moderate persistent asthma without complication   [place] [facility] [place], [place] J, NP   7 months ago Mild persistent asthma, unspecified whether complicated   [facility] [place], [place], NP   1 year ago Dermatitis   [facility] [place] Matar, MD   1 year ago Edema, unspecified type   [facility] [place], Marzella Schlein, [place]   2 years ago Dermatitis   [facility] Marcine Matar, MD

## 2020-01-04 NOTE — Telephone Encounter (Signed)
Pt is requesting short term prednisone. Last seen in June of this year via video visit. Of note, she was provided with prednisone at this visit and it was refilled 11/14/2019. Pt may need to be seen by her PCP. Will route to her for review.

## 2020-01-31 ENCOUNTER — Other Ambulatory Visit: Payer: Self-pay | Admitting: Internal Medicine

## 2020-01-31 DIAGNOSIS — J4530 Mild persistent asthma, uncomplicated: Secondary | ICD-10-CM

## 2020-01-31 NOTE — Telephone Encounter (Signed)
Requested medication (s) are due for refill today: yes  Requested medication (s) are on the active medication list: yes  Last refill:  11/15/19  Future visit scheduled: no  Notes to clinic:  med not delegated to NT to RF   Requested Prescriptions  Pending Prescriptions Disp Refills   predniSONE (DELTASONE) 20 MG tablet [Pharmacy Med Name: predniSONE 20 MG Oral Tablet] 5 tablet 0    Sig: TAKE 1 TABLET BY MOUTH  DAILY WITH BREAKFAST      Not Delegated - Endocrinology:  Oral Corticosteroids Failed - 01/31/2020  1:00 PM      Failed - This refill cannot be delegated      Passed - Last BP in normal range    BP Readings from Last 1 Encounters:  07/03/18 98/62          Passed - Valid encounter within last 6 months    Recent Outpatient Visits           3 months ago Moderate persistent asthma without complication   [facility] [place], [place] J, NP   8 months ago Mild persistent asthma, unspecified whether complicated   [facility] [place], [place], NP   1 year ago Dermatitis   [facility] Marcine Matar, MD   1 year ago Edema, unspecified type   [facility] [place], Marzella Schlein, [place]   2 years ago Dermatitis   [facility] Marcine Matar, MD

## 2020-02-13 ENCOUNTER — Other Ambulatory Visit: Payer: Self-pay | Admitting: Internal Medicine

## 2020-02-13 DIAGNOSIS — J4530 Mild persistent asthma, uncomplicated: Secondary | ICD-10-CM

## 2020-02-13 NOTE — Telephone Encounter (Signed)
Requested medication (s) are due for refill today: yes  Requested medication (s) are on the active medication list: yes  Last refill:  11/15/19   Future visit scheduled: no- pt asking for virtual visit- attempted to make appt- but no openings until 2 weeks from now- Pt's PCP had not openings until first of December- Pt needs appt- please call pt to make virtual appt- Pt is active in MyChart.  Notes to clinic: med not delegated to NT to RF   Requested Prescriptions  Pending Prescriptions Disp Refills   predniSONE (DELTASONE) 20 MG tablet [Pharmacy Med Name: predniSONE 20 MG Oral Tablet] 5 tablet 0    Sig: TAKE 1 TABLET BY MOUTH  DAILY WITH BREAKFAST      Not Delegated - Endocrinology:  Oral Corticosteroids Failed - 02/13/2020  3:40 PM      Failed - This refill cannot be delegated      Passed - Last BP in normal range    BP Readings from Last 1 Encounters:  07/03/18 98/62          Passed - Valid encounter within last 6 months    Recent Outpatient Visits           4 months ago Moderate persistent asthma without complication   [place] [facility] [place], [place] J, NP   9 months ago Mild persistent asthma, unspecified whether complicated   [facility] [place], [place], NP   1 year ago Dermatitis   [facility] Marcine Matar, MD   1 year ago Edema, unspecified type   [facility] [place], Marzella Schlein, [place]   2 years ago Dermatitis   [facility] Marcine Matar, MD

## 2020-02-16 ENCOUNTER — Other Ambulatory Visit: Payer: Self-pay | Admitting: Internal Medicine

## 2020-02-16 DIAGNOSIS — J4530 Mild persistent asthma, uncomplicated: Secondary | ICD-10-CM

## 2020-02-17 NOTE — Telephone Encounter (Signed)
Please contact pt and schedule a follow up appt

## 2020-02-17 NOTE — Telephone Encounter (Signed)
Virtual is fine

## 2020-02-17 NOTE — Telephone Encounter (Signed)
Called patient to schedule in person appointment and patient stated she would not come in for an in person visit due to risk of exposure to COVID. Patient stated that they would be seen virtually. Did not schedule a visit since the visit request was for in person. Could this be a virtual visit?

## 2020-03-08 ENCOUNTER — Other Ambulatory Visit: Payer: Self-pay | Admitting: Family

## 2020-03-08 DIAGNOSIS — J4540 Moderate persistent asthma, uncomplicated: Secondary | ICD-10-CM

## 2020-03-10 ENCOUNTER — Other Ambulatory Visit: Payer: Self-pay | Admitting: Family

## 2020-03-10 DIAGNOSIS — J4540 Moderate persistent asthma, uncomplicated: Secondary | ICD-10-CM

## 2020-03-10 MED ORDER — MONTELUKAST SODIUM 10 MG PO TABS: 10.0000 mg | ORAL_TABLET | Freq: Every day | ORAL | 0 refills | Status: DC

## 2020-04-08 ENCOUNTER — Ambulatory Visit: Payer: BC Managed Care – PPO | Attending: Internal Medicine | Admitting: Internal Medicine

## 2020-04-08 ENCOUNTER — Other Ambulatory Visit: Payer: Self-pay

## 2020-04-08 DIAGNOSIS — Z5320 Procedure and treatment not carried out because of patient's decision for unspecified reasons: Secondary | ICD-10-CM

## 2020-04-08 DIAGNOSIS — J4540 Moderate persistent asthma, uncomplicated: Secondary | ICD-10-CM | POA: Diagnosis not present

## 2020-04-08 DIAGNOSIS — Z2821 Immunization not carried out because of patient refusal: Secondary | ICD-10-CM | POA: Diagnosis not present

## 2020-04-08 DIAGNOSIS — Z1231 Encounter for screening mammogram for malignant neoplasm of breast: Secondary | ICD-10-CM

## 2020-04-08 DIAGNOSIS — Z124 Encounter for screening for malignant neoplasm of cervix: Secondary | ICD-10-CM

## 2020-04-08 DIAGNOSIS — L309 Dermatitis, unspecified: Secondary | ICD-10-CM | POA: Diagnosis not present

## 2020-04-08 DIAGNOSIS — D509 Iron deficiency anemia, unspecified: Secondary | ICD-10-CM

## 2020-04-08 MED ORDER — DULERA 200-5 MCG/ACT IN AERO: 2.0000 | INHALATION_SPRAY | Freq: Two times a day (BID) | RESPIRATORY_TRACT | 6 refills | Status: DC

## 2020-04-08 MED ORDER — ALBUTEROL SULFATE HFA 108 (90 BASE) MCG/ACT IN AERS: 2.0000 | INHALATION_SPRAY | Freq: Four times a day (QID) | RESPIRATORY_TRACT | 6 refills | Status: DC | PRN

## 2020-04-08 MED ORDER — TRIAMCINOLONE ACETONIDE 0.025 % EX OINT: 1.0000 "application " | TOPICAL_OINTMENT | Freq: Two times a day (BID) | CUTANEOUS | 2 refills | Status: DC

## 2020-04-08 MED ORDER — MONTELUKAST SODIUM 10 MG PO TABS: 10.0000 mg | ORAL_TABLET | Freq: Every day | ORAL | 6 refills | Status: DC

## 2020-04-08 NOTE — Progress Notes (Signed)
Virtual Visit via Telephone Note  I connected with Janice Blake on 04/08/20 at 11:08 a.m by telephone and verified that I am speaking with the correct person using two identifiers.  Location: Patient: home Provider: office  Only the patient and myself participated in this encounter. I discussed the limitations, risks, security and privacy concerns of performing an evaluation and management service by telephone and the availability of in person appointments. I also discussed with the patient that there may be a patient responsible charge related to this service. The patient expressed understanding and agreed to proceed.   History of Present Illness: Pt with hx of asthma, IDA, Lichen Planus.  Purpose of today's visit is chronic disease management.  Main concern today is that she still has ongoing issues with dermatitis on the lateral aspect of the left ankle.  Present for over a year. -It itches, hurts and burns.  Tries not to scratch but she ends up scratching it in her sleep. -Uses triamcinolone cream which helps some.  Also uses Aveeno OTC. -Notices that when she is on prednisone it does not itch as much and not as inflamed.  Wanting to know whether she should be using prednisone rather than the triamcinolone cream. -Also wants to know whether she needs antibiotics for it.  Reports her grandson had similar issue that resolved with antibiotics.  Asthma: Doing okay on Dulera.  Can go months without having to use albuterol. Sometimes she has problems with allergies and sinuses but not as much as she used to.  Uses herbal tea and steam baths to help when she has flareup of allergy and sinuses.  She tries to avoid things that affect her allergies.  Anemia: Not taking iron supplements anymore.  States that she does beets smoothie every morning.  Denies any fatigue or dizziness.  HM: Due for Pap smear and mammogram.  Agreeable for referral for mammogram.  I told her that we can do her Pap.   Requests referral to GYN for the Pap. -Declines flu shot and COVID-19 vaccines.  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  She does not feel that everything that goes through FDA approval is safe. -Overdue for routine blood tests including CBC, chemistry and lipid profile.  Patient declines -Due for colon cancer screening  Outpatient Encounter Medications as of 04/08/2020  Medication Sig  . albuterol (VENTOLIN HFA) 108 (90 Base) MCG/ACT inhaler Inhale 2 puffs into the lungs every 6 (six) hours as needed for wheezing or shortness of breath.  . ferrous sulfate 325 (65 FE) MG tablet Take 1 tablet (325 mg total) by mouth 2 (two) times daily with a meal.  . fluticasone (FLONASE) 50 MCG/ACT nasal spray USE TWO SPRAY(S) IN EACH NOSTRIL ONCE DAILY  . mometasone-formoterol (DULERA) 200-5 MCG/ACT AERO Inhale 2 puffs into the lungs 2 (two) times daily.  . montelukast (SINGULAIR) 10 MG tablet Take 1 tablet (10 mg total) by mouth at bedtime.  . potassium chloride (K-DUR,KLOR-CON) 10 MEQ tablet Take 1 tablet (10 mEq total) by mouth daily.  . predniSONE (DELTASONE) 20 MG tablet Take 1 tablet (20 mg total) by mouth daily with breakfast. Needs to be seen prior to next refill request.  . triamcinolone cream (KENALOG) 0.1 % Apply 1 application topically 2 (two) times daily.  . [DISCONTINUED] furosemide (LASIX) 20 MG tablet Take 1 tablet (20 mg total) by mouth daily. X 5 days for swelling (Patient not taking: Reported on  09/18/2018)  . [DISCONTINUED] sulfamethoxazole-trimethoprim (BACTRIM) 400-80 MG tablet Take 1 tablet by mouth 2 (two) times daily. (Patient not taking: Reported on 04/08/2020)   No facility-administered encounter medications on file as of 04/08/2020.    Observations/Objective: No direct observation done as this was a telephone encounter.  Assessment and Plan: 1. Dermatitis -Advised against using prednisone as a long-term answer to this  dermatitis that she has around her ankle.  Risks of long-term prednisone use in this case certainly outweighs the benefits.  I outlined to her some of the risks of long-term prednisone use include drug-induced diabetes, weight gain, osteoporosis etc.  I recommend referral back to dermatology. Also recommend changing the triamcinolone cream to the ointment.  Advised to wear gloves at nights to help avoid scratching the area. -In terms of prescribing antibiotics, I do not think that is a good idea either without me having seen the area of concern in person.  We had scheduled her for in person visit last year but she no showed the appointments.  Patient states that she just did not want to take the chance of coming in in the height of the Covid pandemic because she is around her grandchildren - Ambulatory referral to Dermatology - triamcinolone (KENALOG) 0.025 % ointment; Apply 1 application topically 2 (two) times daily.  Dispense: 30 g; Refill: 2  2. Moderate persistent asthma without complication Controlled on Dulera. - montelukast (SINGULAIR) 10 MG tablet; Take 1 tablet (10 mg total) by mouth at bedtime.  Dispense: 30 tablet; Refill: 6 - mometasone-formoterol (DULERA) 200-5 MCG/ACT AERO; Inhale 2 puffs into the lungs 2 (two) times daily.  Dispense: 13 g; Refill: 6 - albuterol (VENTOLIN HFA) 108 (90 Base) MCG/ACT inhaler; Inhale 2 puffs into the lungs every 6 (six) hours as needed for wheezing or shortness of breath.  Dispense: 8 g; Refill: 6  3. Iron deficiency anemia, unspecified iron deficiency anemia type Advised that we check a CBC since she has not had one in over a year.  Patient declines baseline blood test  4. Influenza vaccination declined 5. COVID-19 vaccination declined I recommend getting both flu vaccine and COVID-19 vaccine given history of asthma.  Patient declines both for reasons that I have documented above.  6. Colon cancer screening declined Discussed colon cancer screening  recommendation for persons at average risk for colon cancer and methods used for screening including colonoscopy, Cologuard test, virtual colonoscopy,fit.  Patient declines screening at this time.  7. Encounter for screening mammogram for malignant neoplasm of breast - MM Digital Screening; Future  8. Pap smear for cervical cancer screening - Ambulatory referral to Gynecology   Follow Up Instructions: 4-5 mths   I discussed the assessment and treatment plan with the patient. The patient was provided an opportunity to ask questions and all were answered. The patient agreed with the plan and demonstrated an understanding of the instructions.   The patient was advised to call back or seek an in-person evaluation if the symptoms worsen or if the condition fails to improve as anticipated.  I provided 25 minutes of non-face-to-face time during this encounter.   Jonah Blue, MD

## 2020-04-13 ENCOUNTER — Other Ambulatory Visit: Payer: Self-pay | Admitting: Internal Medicine

## 2020-04-13 DIAGNOSIS — J4530 Mild persistent asthma, uncomplicated: Secondary | ICD-10-CM

## 2020-04-13 MED ORDER — PREDNISONE 20 MG PO TABS: ORAL_TABLET | ORAL | 0 refills | Status: DC

## 2020-04-13 NOTE — Telephone Encounter (Signed)
Will forward to pcp

## 2020-05-06 ENCOUNTER — Encounter: Payer: Self-pay | Admitting: Nurse Practitioner

## 2020-05-10 ENCOUNTER — Other Ambulatory Visit: Payer: Self-pay | Admitting: Internal Medicine

## 2020-05-10 DIAGNOSIS — J4530 Mild persistent asthma, uncomplicated: Secondary | ICD-10-CM

## 2020-05-11 MED ORDER — PREDNISONE 20 MG PO TABS
ORAL_TABLET | ORAL | 0 refills | Status: DC
Start: 2020-05-11 — End: 2020-06-19

## 2020-05-17 ENCOUNTER — Encounter: Payer: Self-pay | Admitting: Nurse Practitioner

## 2020-05-20 DIAGNOSIS — L818 Other specified disorders of pigmentation: Secondary | ICD-10-CM | POA: Diagnosis not present

## 2020-05-20 DIAGNOSIS — D485 Neoplasm of uncertain behavior of skin: Secondary | ICD-10-CM | POA: Diagnosis not present

## 2020-05-20 DIAGNOSIS — L280 Lichen simplex chronicus: Secondary | ICD-10-CM | POA: Diagnosis not present

## 2020-05-20 DIAGNOSIS — L308 Other specified dermatitis: Secondary | ICD-10-CM | POA: Diagnosis not present

## 2020-05-26 ENCOUNTER — Other Ambulatory Visit: Payer: Self-pay | Admitting: Physician Assistant

## 2020-06-06 ENCOUNTER — Other Ambulatory Visit: Payer: Self-pay | Admitting: Pharmacy Technician

## 2020-06-06 MED FILL — BETAMETHASONE DP 0.05% OINT: 0.05 | 30 days supply | Qty: 45 | Fill #0

## 2020-06-08 ENCOUNTER — Encounter: Payer: Self-pay | Admitting: Family

## 2020-06-09 ENCOUNTER — Encounter: Payer: Self-pay | Admitting: Internal Medicine

## 2020-06-13 ENCOUNTER — Other Ambulatory Visit: Payer: Self-pay

## 2020-06-13 ENCOUNTER — Ambulatory Visit: Payer: BC Managed Care – PPO | Attending: Internal Medicine | Admitting: Internal Medicine

## 2020-06-13 ENCOUNTER — Encounter: Payer: Self-pay | Admitting: Internal Medicine

## 2020-06-13 VITALS — BP 115/73 | HR 79 | Resp 16 | Wt 210.8 lb

## 2020-06-13 DIAGNOSIS — E669 Obesity, unspecified: Secondary | ICD-10-CM

## 2020-06-13 DIAGNOSIS — E66811 Obesity, class 1: Secondary | ICD-10-CM | POA: Insufficient documentation

## 2020-06-13 DIAGNOSIS — G629 Polyneuropathy, unspecified: Secondary | ICD-10-CM | POA: Diagnosis not present

## 2020-06-13 DIAGNOSIS — L281 Prurigo nodularis: Secondary | ICD-10-CM

## 2020-06-13 NOTE — Progress Notes (Signed)
Patient ID: Janice Blake, female    DOB: 1970-10-18  MRN: [id number]  CC: Numbness   Subjective: Janice Blake is a 50 y.o. female who presents for UC visit Her concerns today include:  Pt with hx of asthma,IDA, Lichen Planus  Pt c/o numbness and dark discoloration LT lower leg laterally Numbness started 2-3 wks ago.  The discoloration and rash on her leg have been there for a while.  She recently saw the dermatologist for this. Numbness is intermittent.  Pt sits in front of computer all day. Gets numbness in the left leg with prolonged sitting.  It goes away with repositioning.  She also gets the numbness when she lays on her left side at nights.  Again it goes away with repositioning or sitting on the side of the bed holding her leg out.  No numbness with ambulation.  No pain in the legs with ambulation.    Saw derm recently for the rash on her left leg and had bx. 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  She was prescribed tacrolimus and betamethasone ointment.  Reports that she is more sedentary since working from home.  She is thinking about going back into the office.  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  She is planning to get some home exercise equipment. Patient Active Problem List   Diagnosis Date Noted  . Colon cancer screening declined 04/08/2020  . COVID-19 vaccination declined 04/08/2020  . Influenza vaccination declined 04/08/2020  . Overweight 10/15/2017  . Back muscle spasm 01/22/2014  . Menorrhagia with regular cycle 11/30/2013  . Anemia, iron deficiency 11/30/2013  . History of nasal polyp 11/30/2013  . Allergic rhinitis 05/05/2013  . Asthma      Current Outpatient Medications on File Prior to Visit  Medication Sig Dispense Refill  . albuterol (VENTOLIN HFA) 108 (90 Base) MCG/ACT inhaler Inhale 2 puffs into the lungs every 6 (six) hours as needed for wheezing or shortness of breath. 8 g  6  . fluticasone (FLONASE) 50 MCG/ACT nasal spray USE TWO SPRAY(S) IN EACH NOSTRIL ONCE DAILY 16 g 5  . mometasone-formoterol (DULERA) 200-5 MCG/ACT AERO Inhale 2 puffs into the lungs 2 (two) times daily. 13 g 6  . montelukast (SINGULAIR) 10 MG tablet Take 1 tablet (10 mg total) by mouth at bedtime. 30 tablet 6  . predniSONE (DELTASONE) 20 MG tablet 1 tab Po daily x 3 days then 1/2 tab daily x 2 days (Patient not taking: Reported on 06/13/2020) 4 tablet 0  . triamcinolone (KENALOG) 0.025 % ointment Apply 1 application topically 2 (two) times daily. 30 g 2   No current facility-administered medications on file prior to visit.    Allergies  Allergen Reactions  . Amoxicillin Hives and Itching  . Levaquin [Levofloxacin In D5w] Hives and Itching    Social History   Socioeconomic History  . Marital status: Single    Spouse name: Not on file  . Number of children: Not on file  . Years of education: Not on file  . Highest education level: Not on file  Occupational History  . Not on file  Tobacco Use  . Smoking status: Never Smoker  . Smokeless tobacco: Never Used  Substance and Sexual Activity  . Alcohol use: No  . Drug use: No  . Sexual activity: Not Currently  Other Topics Concern  . Not on file  Social History Narrative  Substitute teacher for [employer]    Worked in Hotel manager x 9 years    Dx'ed Asthma 1999   4 sons (3 college graduates) Oldest age 52. 3/4 sons live in [place]            Social Determinants of Health   Financial Resource Strain: Not on file  Food Insecurity: Not on file  Transportation Needs: Not on file  Physical Activity: Not on file  Stress: Not on file  Social Connections: Not on file  Intimate Partner Violence: Not on file    Family History  Problem Relation Age of Onset  . Hypertension Father   . Asthma Son   . Diabetes Maternal Grandmother   . Hypertension Maternal Grandfather   . Cancer Paternal Grandmother   . Cancer Paternal  Grandfather        lung cancer     Past Surgical History:  Procedure Laterality Date  . FRACTURE SURGERY    . NASAL POLYP SURGERY Bilateral 2011  . NASAL SINUS SURGERY Bilateral 11/03/2014   Procedure: BILATERAL ENDOSCOPIC POLYPECTOMY ETHMOIDECTOMY MAXILLARY ANTROSTOMY ;  Surgeon: Serena Colonel, MD;  Location: [facility];  Service: ENT;  Laterality: Bilateral;  . PATELLA FRACTURE SURGERY Right 1996   "crushed; rod w/12 screws on one side, 6 screws on the other"  . TUBAL LIGATION  1994    ROS: Review of Systems Negative except as stated above  PHYSICAL EXAM: BP 115/73   Pulse 79   Resp 16   Wt 210 lb 12.8 oz (95.6 kg)   SpO2 97%   BMI 31.13 kg/m   Physical Exam  General appearance - alert, well appearing, middle-aged African-American female and in no distress Mental status - normal mood, behavior, speech, dress, motor activity, and thought processes Neurological -power in both lower extremities 5/5 proximally and distally.  Gross sensation intact in both lower extremities. Extremities -no significant lower extremity edema.  3+ femoral, popliteal, posterior tibialis and dorsalis pedis pulses bilaterally.  Legs are warm. Skin: Hyperpigmented raised rough nodular patches on the lateral aspect of left lower leg.  CMP Latest Ref Rng & Units 07/03/2018 10/15/2017 05/05/2013  Glucose 65 - 99 mg/dL 81 90 92  BUN 6 - 24 mg/dL 7 8 7   Creatinine 0.57 - 1.00 mg/dL 6.80) 3.21(Y  Sodium 134 - 144 mmol/L 136 137 137  Potassium 3.5 - 5.2 mmol/L 3.3(L) 3.6 4.8  Chloride 96 - 106 mmol/L 99 104 101  CO2 20 - 29 mmol/L 21 21 22   Calcium 8.7 - 10.2 mg/dL 2.48) 8.8 8.5  Total Protein 6.0 - 8.5 g/dL 7.3 7.1 -  Total Bilirubin 0.0 - 1.2 mg/dL 0.3 -  Alkaline Phos 39 - 117 IU/L 126(H) 109 -  AST 0 - 40 IU/L 20 10 -  ALT 0 - 32 IU/L 13 7 -   Lipid Panel     Component Value Date/Time   CHOL 167 10/15/2017 1047   TRIG 123 10/15/2017 1047   HDL 47 10/15/2017 1047    CHOLHDL 3.6 10/15/2017 1047   LDLCALC 95 10/15/2017 1047    CBC    Component Value Date/Time   WBC 6.7 07/03/2018 1530   WBC 5.3 05/05/2013 1005   RBC 3.67 (L) 07/03/2018 1530   RBC 3.97 05/06/2013 0323   RBC 4.35 05/05/2013 1005   HGB 8.0 (L) 07/03/2018 1530   HCT 25.9 (L) 07/03/2018 1530   PLT 458 (H) 07/03/2018 1530   MCV 71 (L)  07/03/2018 1530   MCH 21.8 (L) 07/03/2018 1530   MCH 23.9 (L) 05/05/2013 1005   MCHC 30.9 (L) 07/03/2018 1530   MCHC 31.1 05/05/2013 1005   RDW 18.3 (H) 07/03/2018 1530   LYMPHSABS 1.5 07/03/2018 1530   MONOABS 0.5 05/05/2013 1005   EOSABS 0.4 07/03/2018 1530   BASOSABS 0.1 07/03/2018 1530    ASSESSMENT AND PLAN: 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  Symptoms sounds due to intermittent compression.  Encouraged her to get up about every hour from her desk and walk for about 2 to 3 minutes.  I looked up the dermatologic condition that she has and it is mentioned in up-to-date that patients with this condition can sometimes have small fiber neuropathy but I doubt this is the cause in her case as it does sound more like a compression neuropathy.  We will refer to neurology for further evaluation.  I recommend that we do some baseline blood test on her including chemistry but patient declines. 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  3. Obesity (BMI 30.0-34.9) Encouraged her to move more with goal of getting in 30 minutes of moderate intensity exercise at least 3 to 4 days a week.   Patient was given the opportunity to ask questions.  Patient verbalized understanding of the plan and was able to repeat key elements of the plan.   No orders of the defined types were placed in this encounter.    Requested Prescriptions    No prescriptions requested or ordered in this encounter    No follow-ups on file.  Jonah Blue, MD, FACP

## 2020-06-13 NOTE — Progress Notes (Signed)
Please see pt MyChart message from 2/17

## 2020-06-13 NOTE — Patient Instructions (Addendum)
Your history suggests compression of the nerves associated with positioning.  I would recommend getting up about every hour from your workstation and walking around for 2 to 3 minutes. I have also referred you to a neurologist.  He will be called with the appointment date.  I encourage you to do some form of moderate intensity exercise like brisk walking 3 to 4 days a week for 30 minutes.

## 2020-06-17 DIAGNOSIS — L281 Prurigo nodularis: Secondary | ICD-10-CM | POA: Diagnosis not present

## 2020-06-17 DIAGNOSIS — L280 Lichen simplex chronicus: Secondary | ICD-10-CM | POA: Diagnosis not present

## 2020-06-17 MED FILL — TACROLIMUS 0.1 % OINT: 0.1 | 15 days supply | Qty: 30 | Fill #0 | Status: TO

## 2020-06-19 ENCOUNTER — Other Ambulatory Visit: Payer: Self-pay | Admitting: Internal Medicine

## 2020-06-19 DIAGNOSIS — J4530 Mild persistent asthma, uncomplicated: Secondary | ICD-10-CM

## 2020-06-20 MED ORDER — PREDNISONE 20 MG PO TABS: ORAL_TABLET | ORAL | 0 refills | Status: DC

## 2020-06-20 NOTE — Telephone Encounter (Signed)
Will forward to provider  

## 2020-07-14 ENCOUNTER — Other Ambulatory Visit: Payer: Self-pay | Admitting: Internal Medicine

## 2020-07-14 DIAGNOSIS — J4530 Mild persistent asthma, uncomplicated: Secondary | ICD-10-CM

## 2020-07-15 NOTE — Telephone Encounter (Signed)
Will forward to provider  

## 2020-07-16 MED ORDER — PREDNISONE 20 MG PO TABS
ORAL_TABLET | ORAL | 0 refills | Status: DC
Start: 2020-07-16 — End: 2020-08-07

## 2020-08-07 ENCOUNTER — Other Ambulatory Visit: Payer: Self-pay | Admitting: Internal Medicine

## 2020-08-07 DIAGNOSIS — J4530 Mild persistent asthma, uncomplicated: Secondary | ICD-10-CM

## 2020-08-07 DIAGNOSIS — J301 Allergic rhinitis due to pollen: Secondary | ICD-10-CM

## 2020-08-10 MED ORDER — PREDNISONE 10 MG PO TABS: ORAL_TABLET | ORAL | 0 refills | Status: DC

## 2020-08-10 NOTE — Telephone Encounter (Signed)
PC placed to pt this afternoon regarding her request for refill on prednisone. I note that every month sometimes less than once a month she is requesting refill on the prednisone.  I wanted to check in with her to see if she is having issues with her asthma causing her to have to use prednisone every month for several days. -Patient tells me that she has been using it not so much for her asthma but for the inflammation on her lower leg.  She states that she takes a quarter of the 20 mg pills but not every day, about once a week.  She has seen the dermatologist for the issue with her leg and was prescribed steroid cream. Recently she states that with the increased pollen count she has had problems with allergies and the prednisone seems to help with that.  She gets sneezing, watery eyes and congestion when she is outdoors.  She is started walking every morning and she walks with her mask on but sometimes she has to take it off because it gets too hot.  She is on Singulair and uses Zyrtec over-the-counter but is still bothered with the allergies. -Advised that if her allergies are not well controlled with the Flonase nasal spray, Zyrtec and Singulair, we should refer to an allergist.  Long-term use of prednisone is not recommended because it can cause issues like osteoporosis and diabetes.  In regards to the dermatitis on her lower leg, I recommend that she speaks with the dermatologist if she feels the current steroid cream does not work well for her.  She states she has an appointment with a dermatologist next month and will speak with them about it.  She is agreeable to seeing an allergist.  In the meantime I will refill the prednisone for short-term.

## 2020-08-16 ENCOUNTER — Telehealth: Payer: Self-pay | Admitting: Neurology

## 2020-08-16 ENCOUNTER — Encounter: Payer: Self-pay | Admitting: Neurology

## 2020-08-16 ENCOUNTER — Ambulatory Visit (INDEPENDENT_AMBULATORY_CARE_PROVIDER_SITE_OTHER): Payer: BC Managed Care – PPO | Admitting: Neurology

## 2020-08-16 DIAGNOSIS — Z0289 Encounter for other administrative examinations: Secondary | ICD-10-CM

## 2020-08-16 NOTE — Telephone Encounter (Signed)
error 

## 2020-08-16 NOTE — Telephone Encounter (Signed)
Patient no showed new patient appointment in neurology.  If patient calls back she can be rescheduled with any physician here at [facility] however please let her know another no-show or cancellation may result in her dismissal from our practice and that many other patients are awaiting appointments and it would be very kind to only schedule an appointment if she plans on coming.  She has 44% no-shows in epic.

## 2020-08-16 NOTE — Progress Notes (Signed)
NO SHOW

## 2020-09-16 DIAGNOSIS — L281 Prurigo nodularis: Secondary | ICD-10-CM | POA: Diagnosis not present

## 2020-10-11 ENCOUNTER — Ambulatory Visit: Payer: BC Managed Care – PPO | Admitting: Allergy & Immunology

## 2020-10-17 ENCOUNTER — Other Ambulatory Visit: Payer: Self-pay | Admitting: Internal Medicine

## 2020-10-17 DIAGNOSIS — J4530 Mild persistent asthma, uncomplicated: Secondary | ICD-10-CM

## 2020-10-17 MED ORDER — PREDNISONE 10 MG PO TABS
ORAL_TABLET | ORAL | 0 refills | Status: DC
Start: 2020-10-17 — End: 2020-12-16

## 2020-10-19 ENCOUNTER — Other Ambulatory Visit: Payer: Self-pay

## 2020-11-14 ENCOUNTER — Other Ambulatory Visit: Payer: Self-pay | Admitting: Internal Medicine

## 2020-11-14 DIAGNOSIS — J4540 Moderate persistent asthma, uncomplicated: Secondary | ICD-10-CM

## 2020-11-14 NOTE — Telephone Encounter (Signed)
Requested Prescriptions  Pending Prescriptions Disp Refills  . montelukast (SINGULAIR) 10 MG tablet [Pharmacy Med Name: Montelukast Sodium 10 MG Oral Tablet] 30 tablet 2    Sig: TAKE 1 TABLET BY MOUTH AT BEDTIME     Pulmonology:  Leukotriene Inhibitors Passed - 11/14/2020  5:47 PM      Passed - Valid encounter within last 12 months    Recent Outpatient Visits          5 months ago Sensory neuropathy   [facility] Marcine Matar, MD   7 months ago Dermatitis   [facility] Marcine Matar, MD   1 year ago Moderate persistent asthma without complication   [facility] [place], [place], [place]   1 year ago Mild persistent asthma, unspecified whether complicated   [facility] [place], [place], [place]   2 years ago Dermatitis   [facility] Marcine Matar, MD

## 2020-12-16 ENCOUNTER — Other Ambulatory Visit: Payer: Self-pay | Admitting: Internal Medicine

## 2020-12-16 DIAGNOSIS — J4530 Mild persistent asthma, uncomplicated: Secondary | ICD-10-CM

## 2020-12-16 DIAGNOSIS — L281 Prurigo nodularis: Secondary | ICD-10-CM | POA: Diagnosis not present

## 2020-12-16 DIAGNOSIS — L080 Pyoderma: Secondary | ICD-10-CM | POA: Diagnosis not present

## 2020-12-16 MED ORDER — PREDNISONE 10 MG PO TABS: ORAL_TABLET | ORAL | 0 refills | Status: DC

## 2020-12-22 ENCOUNTER — Ambulatory Visit: Payer: BC Managed Care – PPO | Admitting: Allergy & Immunology

## 2020-12-27 ENCOUNTER — Ambulatory Visit: Payer: BC Managed Care – PPO | Admitting: Allergy & Immunology

## 2021-01-26 ENCOUNTER — Other Ambulatory Visit: Payer: Self-pay | Admitting: Internal Medicine

## 2021-01-26 DIAGNOSIS — J4530 Mild persistent asthma, uncomplicated: Secondary | ICD-10-CM

## 2021-01-27 MED ORDER — PREDNISONE 10 MG PO TABS
ORAL_TABLET | ORAL | 0 refills | Status: DC
Start: 2021-01-27 — End: 2021-03-23

## 2021-01-27 NOTE — Telephone Encounter (Signed)
Will forward to provider  

## 2021-02-16 ENCOUNTER — Other Ambulatory Visit: Payer: Self-pay | Admitting: Internal Medicine

## 2021-02-16 DIAGNOSIS — J4540 Moderate persistent asthma, uncomplicated: Secondary | ICD-10-CM

## 2021-02-16 NOTE — Telephone Encounter (Signed)
Requested Prescriptions  Pending Prescriptions Disp Refills  . montelukast (SINGULAIR) 10 MG tablet [Pharmacy Med Name: Montelukast Sodium 10 MG Oral Tablet] 30 tablet 1    Sig: TAKE 1 TABLET BY MOUTH AT BEDTIME     Pulmonology:  Leukotriene Inhibitors Passed - 02/16/2021  5:30 AM      Passed - Valid encounter within last 12 months    Recent Outpatient Visits          8 months ago Sensory neuropathy   [facility] Marcine Matar, MD   10 months ago Dermatitis   [facility] Marcine Matar, MD   1 year ago Moderate persistent asthma without complication   [facility] [place], [place], NP   1 year ago Mild persistent asthma, unspecified whether complicated   [facility] [place], [place], NP   2 years ago Dermatitis   [facility] Marcine Matar, MD

## 2021-02-28 ENCOUNTER — Ambulatory Visit: Payer: BC Managed Care – PPO | Admitting: Allergy & Immunology

## 2021-03-10 ENCOUNTER — Other Ambulatory Visit: Payer: Self-pay | Admitting: Internal Medicine

## 2021-03-10 DIAGNOSIS — J4530 Mild persistent asthma, uncomplicated: Secondary | ICD-10-CM

## 2021-03-23 ENCOUNTER — Ambulatory Visit (INDEPENDENT_AMBULATORY_CARE_PROVIDER_SITE_OTHER): Payer: BC Managed Care – PPO | Admitting: Allergy & Immunology

## 2021-03-23 ENCOUNTER — Encounter: Payer: Self-pay | Admitting: Allergy & Immunology

## 2021-03-23 ENCOUNTER — Other Ambulatory Visit: Payer: Self-pay

## 2021-03-23 VITALS — BP 110/68 | HR 93 | Temp 98.3°F | Resp 16 | Ht 69.0 in | Wt 195.0 lb

## 2021-03-23 DIAGNOSIS — Z886 Allergy status to analgesic agent status: Secondary | ICD-10-CM

## 2021-03-23 DIAGNOSIS — J45909 Unspecified asthma, uncomplicated: Secondary | ICD-10-CM

## 2021-03-23 DIAGNOSIS — J310 Chronic rhinitis: Secondary | ICD-10-CM

## 2021-03-23 DIAGNOSIS — J339 Nasal polyp, unspecified: Secondary | ICD-10-CM | POA: Insufficient documentation

## 2021-03-23 DIAGNOSIS — L2089 Other atopic dermatitis: Secondary | ICD-10-CM

## 2021-03-23 DIAGNOSIS — J4540 Moderate persistent asthma, uncomplicated: Secondary | ICD-10-CM | POA: Diagnosis not present

## 2021-03-23 MED ORDER — BREZTRI AEROSPHERE 160-9-4.8 MCG/ACT IN AERO: 2.0000 | INHALATION_SPRAY | Freq: Two times a day (BID) | RESPIRATORY_TRACT | 5 refills | Status: DC

## 2021-03-23 MED ORDER — XHANCE 93 MCG/ACT NA EXHU: 1.0000 | INHALANT_SUSPENSION | Freq: Two times a day (BID) | NASAL | 5 refills | Status: DC | PRN

## 2021-03-23 NOTE — Patient Instructions (Addendum)
1. Moderate persistent asthma, uncomplicated - Your lung testing was only in the 40% range, but this proved to the mid 50% range after our albuterol was given. - We are going to change you to [place] instead of Dulera. Markus Daft contains 3 medicines to help with your breathing. - Spacer sample and demonstration provided. - Daily controller medication(s): Breztri 2 puffs twice daily with spacer - Prior to physical activity: albuterol 2 puffs 10-15 minutes before physical activity. - Rescue medications: albuterol 4 puffs every 4-6 hours as needed - Asthma control goals:  * Full participation in all desired activities (may need albuterol before activity) * Albuterol use two time or less a week on average (not counting use with activity) * Cough interfering with sleep two time or less a month * Oral steroids no more than once a year * No hospitalizations  2. Chronic rhinitis - Testing today showed: negative to the entire panel, although the positive control was also nonreactive. - Copy of test results provided.  - We were going to get blood work to confirm this. - We we will call you in 1 to 2 weeks with the results of the testing. - Stop taking: Flonase - Start taking: Xhance (fluticasone) 1-2 sprays per nostril twice daily - You can use an extra dose of the antihistamine, if needed, for breakthrough symptoms.  - Consider nasal saline rinses 1-2 times daily to remove allergens from the nasal cavities as well as help with mucous clearance (this is especially helpful to do before the nasal sprays are given)  3. Nasal polyps - I would strongly consider starting Dupixent. - This is a drug that targets a specific pathway in the allergic response. - We are going to get some blood work to see if we can get some additional labs to help get this approved. - There are generous co-pay cards for Dupixent, as it do not let cost to be an issue for you. - Tammy our Biologics Coordinator will reach out to  discuss more.  4. Return in about 4 weeks (around 04/20/2021).    Please inform us of any Emergency Department visits, hospitalizations, or changes in symptoms. Call us before going to the ED for breathing or allergy symptoms since we might be able to fit you in for a sick visit. Feel free to contact us anytime with any questions, problems, or concerns.  It was a pleasure to meet you today!  Websites that have reliable patient information: 1. American Academy of Asthma, Allergy, and Immunology: www.aaaai.org 2. Food Allergy Research and Education (FARE): foodallergy.org 3. Mothers of Asthmatics: http://www.asthmacommunitynetwork.org 4. American College of Allergy, Asthma, and Immunology: www.acaai.org   COVID-19 Vaccine Information can be found at: [email or web address] For questions related to vaccine distribution or appointments, please email [email or web address] or call [phone].   We realize that you might be concerned about having an allergic reaction to the COVID19 vaccines. To help with that concern, WE ARE OFFERING THE COVID19 VACCINES IN OUR OFFICE! Ask the front desk for dates!     "Like" [place] on Facebook and Instagram for our latest updates!      A healthy democracy works best when [employer] participate! Make sure you are registered to vote! If you have moved or changed any of your contact information, you will need to get this updated before voting!  In some cases, you MAY be able to register to vote online: [email or web address]      Airborne Adult Perc - 03/23/21 7106  Time Antigen Placed 8563    Allergen Manufacturer Waynette Buttery    Location Back    Number of Test 59    Panel 1 Select    1. Control-Buffer 50% Glycerol Negative    2. Control-Histamine 1 mg/ml Negative    3. Albumin saline Negative    4. Bahia Negative    5. French Southern Territories Negative    6. Johnson Negative    7.  Kentucky Blue Negative    8. Meadow Fescue Negative    9. Perennial Rye Negative    10. Sweet Vernal Negative    11. Timothy Negative    12. Cocklebur Negative    13. Burweed Marshelder Negative    14. Ragweed, short Negative    15. Ragweed, Giant Negative    16. Plantain,  English Negative    17. Lamb's Quarters Negative    18. Sheep Sorrell Negative    19. Rough Pigweed Negative    20. Marsh Elder, Rough Negative    21. Mugwort, Common Negative    22. Ash mix Negative    23. Birch mix Negative    24. Beech American Negative    25. Box, Elder Negative    26. Cedar, red Negative    27. Cottonwood, Guinea-Bissau Negative    28. Elm mix Negative    29. Hickory Negative    30. Maple mix Negative    31. Oak, Guinea-Bissau mix Negative    32. Pecan Pollen Negative    33. Pine mix Negative    34. Sycamore Eastern Negative    35. Walnut, Black Pollen Negative    36. Alternaria alternata Negative    37. Cladosporium Herbarum Negative    38. Aspergillus mix Negative    39. Penicillium mix Negative    40. Bipolaris sorokiniana (Helminthosporium) Negative    41. Drechslera spicifera (Curvularia) Negative    42. Mucor plumbeus Negative    43. Fusarium moniliforme Negative    44. Aureobasidium pullulans (pullulara) Negative    45. Rhizopus oryzae Negative    46. Botrytis cinera Negative    47. Epicoccum nigrum Negative    48. Phoma betae Negative    49. Candida Albicans Negative    50. Trichophyton mentagrophytes Negative    51. Mite, D Farinae  5,000 AU/ml Negative    52. Mite, D Pteronyssinus  5,000 AU/ml Negative    53. Cat Hair 10,000 BAU/ml Negative    54.  Dog Epithelia Negative    55. Mixed Feathers Negative    56. Horse Epithelia Negative    57. Cockroach, German Negative    58. Mouse Negative    59. Tobacco Leaf Negative

## 2021-03-23 NOTE — Progress Notes (Signed)
NEW PATIENT  Date of Service/Encounter:  03/23/21  Consult requested by: Marcine Matar, MD   Assessment:   Moderate persistent asthma - with AERD  Chronic rhinitis - with non reactive histamine today (confirming with blood work)  Nasal polyps - s/p 2 polypectomies and requiring 5 courses of prednisone per year  Atopic dermatitis   Plan/Recommendations:   1. Moderate persistent asthma, uncomplicated - Your lung testing was only in the 40% range, but this proved to the mid 50% range after our albuterol was given. - We are going to change you to [place] instead of Dulera. Markus Daft contains 3 medicines to help with your breathing. - Spacer sample and demonstration provided. - Daily controller medication(s): Breztri 2 puffs twice daily with spacer - Prior to physical activity: albuterol 2 puffs 10-15 minutes before physical activity. - Rescue medications: albuterol 4 puffs every 4-6 hours as needed - Asthma control goals:  * Full participation in all desired activities (may need albuterol before activity) * Albuterol use two time or less a week on average (not counting use with activity) * Cough interfering with sleep two time or less a month * Oral steroids no more than once a year * No hospitalizations  2. Chronic rhinitis - Testing today showed: negative to the entire panel, although the positive control was also nonreactive. - Copy of test results provided.  - We were going to get blood work to confirm this. - We we will call you in 1 to 2 weeks with the results of the testing. - Stop taking: Flonase - Start taking: Xhance (fluticasone) 1-2 sprays per nostril twice daily - You can use an extra dose of the antihistamine, if needed, for breakthrough symptoms.  - Consider nasal saline rinses 1-2 times daily to remove allergens from the nasal cavities as well as help with mucous clearance (this is especially helpful to do before the nasal sprays are given)  3. Nasal  polyps - I would strongly consider starting Dupixent. - This is a drug that targets a specific pathway in the allergic response. - We are going to get some blood work to see if we can get some additional labs to help get this approved. - There are generous co-pay cards for Dupixent, as it do not let cost to be an issue for you. - Tammy our Biologics Coordinator will reach out to discuss more.  4. Return in about 4 weeks (around 04/20/2021).     This note in its entirety was forwarded to the Provider who requested this consultation.  Subjective:   Janice Blake is a 50 y.o. female presenting today for evaluation of  Chief Complaint  Patient presents with   Asthma    Says congestion makes her asthma worse and since she has stopped her zyrtec she can see the affects of it.    Allergic Rhinitis     PCP sent her over due to the end of prednisone tapers she has issues smelling.   Nasal Polyps    Has had prior issues and has had polyps removed twice.     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  Janice Blake was referred by Marcine Matar, MD.     Jessicaann is a 50 y.o. female presenting for an evaluation of concern for asthma and allergies.    Asthma/Respiratory Symptom History: She was diagnosed with  asthma when she was working in the [employer] and inhalation of the dust particles. This was in the 1990s. She is on Dulera two puffs twice daily. She does not use a spacer. She is on the montelukast. She typically gets prednisone five times per year, either for breathing or her nasal polyps.   Last prednisone course was one month ago.  Of note, she does start to have worsening issues when she is exposed to aspirin.  She does not use it at all now.  Allergic Rhinitis Symptom History: She reports that she gets prednisone routinely for nasal polyps. She has had two surgeries. She has had nasal polyps in her 87s. She presented with chronic congestion and sinus infections. She had surgery done in [place] by Dr. Rubye Oaks and then when she moved here  she was seen by Dr. Pollyann Kennedy. Her last surgery was July 2016. She gets better after each one for a while, but after a year or so her symptoms resume. She is on Flonase two sprays per nostril daily. She gets completely better on the prednisone. She has had prednisone five times in the last 12 months. This is fairly typical for her. She has issues with cats. She also has a lot of issues in the spring time.   Food Allergy Symptom History: Peanuts, chocolate, and dairy products tend to make her symptoms worse. She tends to adopt a plant based diet. These make her congestion worse. But she cheats occasionally.   Skin Symptom History: She is on betamethasone and triamcinolone. This was given by [facility] (Dr. Dareen Piano). She has seen her for around one year or so.  Her worst areas on her legs.  Otherwise, there is no history of other atopic diseases, including drug allergies, stinging insect allergies, urticaria, or contact dermatitis. There is no significant infectious history. Vaccinations are up to date.    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 If you have any questions, ask your nurse or doctor.          STOP taking these medications    predniSONE 10 MG tablet Commonly known as: DELTASONE  Stopped by: Alfonse Spruce, MD       TAKE these medications    albuterol 108 (90 Base) MCG/ACT inhaler Commonly known as: VENTOLIN HFA Inhale 2 puffs into the lungs every 6 (six) hours as needed for wheezing or shortness of breath.   betamethasone dipropionate 0.05 % ointment Commonly known as: DIPROLENE APPLY LIBERALLY TO AFFECTED AREA TWICE DAILY FOR 2 WEEKS. ALTERNATE WITH PROTOPIC (TACROLIMUS)   Breztri Aerosphere 160-9-4.8 MCG/ACT Aero Generic drug: Budeson-Glycopyrrol-Formoterol Inhale 2 puffs into the lungs in the morning and at bedtime. Started by: Alfonse Spruce, MD   cetirizine 10 MG chewable tablet Commonly known as: ZYRTEC Chew 10 mg by mouth daily.   Dulera 200-5 MCG/ACT Aero Generic drug: mometasone-formoterol Inhale 2 puffs into the lungs 2 (two) times daily.   fluticasone 50 MCG/ACT nasal spray Commonly known as: FLONASE USE TWO SPRAY(S) IN EACH NOSTRIL ONCE DAILY What changed: Another medication with the same name was added. Make sure you understand how and when to take each. Changed  by: Alfonse Spruce, MD   Charmayne Sheer MCG/ACT Exhu Generic drug: Fluticasone Propionate Place 1 spray into the nose 2 (two) times daily as needed. What changed: You were already taking a medication with the same name, and this prescription was added. Make sure you understand how and when to take each. Changed by: Alfonse Spruce, MD   montelukast 10 MG tablet Commonly known as: SINGULAIR TAKE 1 TABLET BY MOUTH AT BEDTIME   triamcinolone 0.025 % ointment Commonly known as: KENALOG Apply 1 application topically 2 (two) times daily.        Birth History: non-contributory  Developmental History: non-contributory  Past Surgical History: Past Surgical History:  Procedure Laterality Date   FRACTURE SURGERY     NASAL POLYP SURGERY Bilateral 2011   NASAL SINUS SURGERY Bilateral 11/03/2014   Procedure: BILATERAL ENDOSCOPIC POLYPECTOMY ETHMOIDECTOMY MAXILLARY ANTROSTOMY ;  Surgeon: Serena Colonel, MD;  Location: [facility];  Service: ENT;  Laterality: Bilateral;   PATELLA FRACTURE SURGERY Right 1996   "crushed; rod w/12 screws on one side, 6 screws on the other"   TUBAL LIGATION  1994     Family History: Family History  Problem Relation Age of Onset   Allergic rhinitis Mother    Asthma Father    Hypertension Father    Diabetes Maternal Grandmother    Hypertension Maternal Grandfather    Cancer Paternal Grandmother    Cancer Paternal Grandfather        lung cancer    Asthma Son      Social History: [name] lives at home with her family.  They live in a townhome.  There is hardwood in the living areas and carpeting in the bedroom.  They have electric heating and central cooling.  There are no animals inside or outside of the home.  There are no dust mite covers on the bedding.  She currently works as a IT consultant for the past 5 years.  She describes this as a stressful job.  She is not exposed to fumes, chemicals, or dust.  There is no HEPA  filter in the home.  She does not live near an interstate or industrial area.   Review of Systems  Constitutional: Negative.  Negative for chills, fever, malaise/fatigue and weight loss.  HENT:  Positive for congestion. Negative for ear discharge and ear pain.   Eyes:  Negative for pain, discharge and redness.  Respiratory:  Positive for cough and  shortness of breath. Negative for sputum production and wheezing.   Cardiovascular: Negative.  Negative for chest pain and palpitations.  Gastrointestinal:  Negative for abdominal pain, constipation, diarrhea, heartburn, nausea and vomiting.  Skin: Negative.  Negative for itching and rash.  Neurological:  Negative for dizziness and headaches.  Endo/Heme/Allergies:  Positive for environmental allergies. Does not bruise/bleed easily.      Objective:   Blood pressure 110/68, pulse 93, temperature 98.3 F (36.8 C), temperature source Temporal, resp. rate 16, height 5\' 9"  (1.753 m), weight 195 lb (88.5 kg), SpO2 96 %. Body mass index is 28.8 kg/m.   Physical Exam:   Physical Exam Vitals reviewed.  Constitutional:      Appearance: She is well-developed.     Comments: Very pleasant female.  HENT:     Head: Normocephalic and atraumatic.     Right Ear: Tympanic membrane, ear canal and external ear normal. No drainage, swelling or tenderness. Tympanic membrane is not injected, scarred, erythematous, retracted or bulging.     Left Ear: Tympanic membrane, ear canal and external ear normal. No drainage, swelling or tenderness. Tympanic membrane is not injected, scarred, erythematous, retracted or bulging.     Nose: No nasal deformity, septal deviation, mucosal edema or rhinorrhea.     Right Turbinates: Enlarged, swollen and pale.     Left Turbinates: Enlarged, swollen and pale.     Right Sinus: No maxillary sinus tenderness or frontal sinus tenderness.     Left Sinus: No maxillary sinus tenderness or frontal sinus tenderness.     Comments: Nasal  polyps bilaterally obstructing approximately 100% of the nasal passages.    Mouth/Throat:     Mouth: Mucous membranes are not pale and not dry.     Pharynx: Uvula midline.  Eyes:     General:        Right eye: No discharge.        Left eye: No discharge.     Conjunctiva/sclera: Conjunctivae normal.     Right eye: Right conjunctiva is not injected. No chemosis.    Left eye: Left conjunctiva is not injected. No chemosis.    Pupils: Pupils are equal, round, and reactive to light.  Cardiovascular:     Rate and Rhythm: Normal rate and regular rhythm.     Heart sounds: Normal heart sounds.  Pulmonary:     Effort: Pulmonary effort is normal. No tachypnea, accessory muscle usage or respiratory distress.     Breath sounds: Examination of the right-upper field reveals wheezing. Examination of the left-upper field reveals wheezing. Examination of the right-middle field reveals wheezing. Examination of the left-middle field reveals wheezing. Examination of the right-lower field reveals wheezing. Examination of the left-lower field reveals wheezing. Wheezing present. No rhonchi or rales.     Comments: Decreased air movement at the bases.  Wheezing throughout. Chest:     Chest wall: No tenderness.  Abdominal:     Tenderness: There is no abdominal tenderness. There is no guarding or rebound.  Lymphadenopathy:     Head:     Right side of head: No submandibular, tonsillar or occipital adenopathy.     Left side of head: No submandibular, tonsillar or occipital adenopathy.     Cervical: No cervical adenopathy.  Skin:    Coloration: Skin is not pale.     Findings: No abrasion, erythema, petechiae or rash. Rash is not papular, urticarial or vesicular.  Neurological:     Mental Status: She is alert.  Psychiatric:  Behavior: Behavior is cooperative.     Diagnostic studies:   Spirometry: results abnormal (FEV1: 1.14/41%, FVC: 2.02/59%, FEV1/FVC: 56%).    Spirometry consistent with mixed  obstructive and restrictive disease. Xopenex four puffs via MDI treatment given in clinic with significant improvement in FEV1 and FVC per ATS criteria.  He did not normalize, however.  Allergy Studies:     Airborne Adult Perc - 03/23/21 0941     Time Antigen Placed 1884    Allergen Manufacturer Waynette Buttery    Location Back    Number of Test 59    Panel 1 Select    1. Control-Buffer 50% Glycerol Negative    2. Control-Histamine 1 mg/ml Negative    3. Albumin saline Negative    4. Bahia Negative    5. French Southern Territories Negative    6. Johnson Negative    7. Kentucky Blue Negative    8. Meadow Fescue Negative    9. Perennial Rye Negative    10. Sweet Vernal Negative    11. Timothy Negative    12. Cocklebur Negative    13. Burweed Marshelder Negative    14. Ragweed, short Negative    15. Ragweed, Giant Negative    16. Plantain,  English Negative    17. Lamb's Quarters Negative    18. Sheep Sorrell Negative    19. Rough Pigweed Negative    20. Marsh Elder, Rough Negative    21. Mugwort, Common Negative    22. Ash mix Negative    23. Birch mix Negative    24. Beech American Negative    25. Box, Elder Negative    26. Cedar, red Negative    27. Cottonwood, Guinea-Bissau Negative    28. Elm mix Negative    29. Hickory Negative    30. Maple mix Negative    31. Oak, Guinea-Bissau mix Negative    32. Pecan Pollen Negative    33. Pine mix Negative    34. Sycamore Eastern Negative    35. Walnut, Black Pollen Negative    36. Alternaria alternata Negative    37. Cladosporium Herbarum Negative    38. Aspergillus mix Negative    39. Penicillium mix Negative    40. Bipolaris sorokiniana (Helminthosporium) Negative    41. Drechslera spicifera (Curvularia) Negative    42. Mucor plumbeus Negative    43. Fusarium moniliforme Negative    44. Aureobasidium pullulans (pullulara) Negative    45. Rhizopus oryzae Negative    46. Botrytis cinera Negative    47. Epicoccum nigrum Negative    48. Phoma betae Negative     49. Candida Albicans Negative    50. Trichophyton mentagrophytes Negative    51. Mite, D Farinae  5,000 AU/ml Negative    52. Mite, D Pteronyssinus  5,000 AU/ml Negative    53. Cat Hair 10,000 BAU/ml Negative    54.  Dog Epithelia Negative    55. Mixed Feathers Negative    56. Horse Epithelia Negative    57. Cockroach, German Negative    58. Mouse Negative    59. Tobacco Leaf Negative             Allergy testing results were read and interpreted by myself, documented by clinical staff.         Malachi Bonds, MD [facility]

## 2021-03-27 LAB — CBC WITH DIFFERENTIAL/PLATELET
Basophils Absolute: 0.1 10*3/uL (ref 0.0–0.2)
Basos: 1 %
EOS (ABSOLUTE): 0.3 10*3/uL (ref 0.0–0.4)
Eos: 3 %
Hematocrit: 26 % — ABNORMAL LOW (ref 34.0–46.6)
Hemoglobin: 7.3 g/dL — ABNORMAL LOW (ref 11.1–15.9)
Immature Grans (Abs): 0 10*3/uL (ref 0.0–0.1)
Immature Granulocytes: 0 %
Lymphocytes Absolute: 1.6 10*3/uL (ref 0.7–3.1)
Lymphs: 20 %
MCH: 19.2 pg — ABNORMAL LOW (ref 26.6–33.0)
MCHC: 28.1 g/dL — ABNORMAL LOW (ref 31.5–35.7)
MCV: 68 fL — ABNORMAL LOW (ref 79–97)
Monocytes Absolute: 0.4 10*3/uL (ref 0.1–0.9)
Monocytes: 5 %
Neutrophils Absolute: 5.9 10*3/uL (ref 1.4–7.0)
Neutrophils: 71 %
Platelets: 725 10*3/uL — ABNORMAL HIGH (ref 150–450)
RBC: 3.81 x10E6/uL (ref 3.77–5.28)
RDW: 18.3 % — ABNORMAL HIGH (ref 11.7–15.4)
WBC: 8.3 10*3/uL (ref 3.4–10.8)

## 2021-03-27 LAB — ALLERGENS W/COMP RFLX AREA 2
Alternaria Alternata IgE: <0.1 kU/L
Aspergillus Fumigatus IgE: <0.1 kU/L
Bermuda Grass IgE: <0.1 kU/L
Cedar, Mountain IgE: <0.1 kU/L
Cladosporium Herbarum IgE: <0.1 kU/L
Cockroach, German IgE: <0.1 kU/L
Common Silver Birch IgE: <0.1 kU/L
Cottonwood IgE: <0.1 kU/L
D Farinae IgE: <0.1 kU/L
D Pteronyssinus IgE: <0.1 kU/L
E001-IgE Cat Dander: <0.1 kU/L
E005-IgE Dog Dander: <0.1 kU/L
Elm, American IgE: <0.1 kU/L
IgE (Immunoglobulin E), Serum: 17 IU/mL (ref 6–495)
Johnson Grass IgE: <0.1 kU/L
Maple/Box Elder IgE: <0.1 kU/L
Mouse Urine IgE: <0.1 kU/L
Oak, White IgE: <0.1 kU/L
Pecan, Hickory IgE: <0.1 kU/L
Penicillium Chrysogen IgE: <0.1 kU/L
Pigweed, Rough IgE: <0.1 kU/L
Ragweed, Short IgE: <0.1 kU/L
Sheep Sorrel IgE Qn: <0.1 kU/L
Timothy Grass IgE: <0.1 kU/L
White Mulberry IgE: <0.1 kU/L

## 2021-03-27 LAB — ASPERGILLUS PRECIPITINS
A.Fumigatus #1 Abs: NEGATIVE
Aspergillus Flavus Antibodies: NEGATIVE
Aspergillus Niger Antibodies: NEGATIVE
Aspergillus glaucus IgG: NEGATIVE
Aspergillus nidulans IgG: NEGATIVE
Aspergillus terreus IgG: NEGATIVE

## 2021-03-27 LAB — ALPHA-1-ANTITRYPSIN: A-1 Antitrypsin: 197 mg/dL — ABNORMAL HIGH (ref 101–187)

## 2021-03-27 LAB — ANCA TITERS
Atypical pANCA: <1:20 {titer}
C-ANCA: <1:20 {titer}
P-ANCA: <1:20 {titer}

## 2021-03-28 ENCOUNTER — Telehealth: Payer: Self-pay | Admitting: *Deleted

## 2021-03-28 NOTE — Telephone Encounter (Signed)
-----   Message from Alfonse Spruce, MD sent at 03/23/2021 12:40 PM EST ----- Likely new start Dupixent! I gave her info. Got labs today, but she could certainly be approved for polyps even now.

## 2021-03-28 NOTE — Telephone Encounter (Signed)
Called patient to discuss Dupixent for her asthma and nasal polyps.  She was worried about anaphylaxis possible reaction and we did discuss this as rare. She advised she wants to do more research and possibly see herbalist for treatment.  I advised her if she decided she wants to pursue Dupixent she can reach back out to me

## 2021-03-29 NOTE — Telephone Encounter (Signed)
Great. I look forward to seeing what the Herbalist recommends.  Malachi Bonds, MD [facility]

## 2021-03-30 ENCOUNTER — Telehealth: Payer: Self-pay | Admitting: *Deleted

## 2021-03-30 NOTE — Telephone Encounter (Signed)
PA has been submitted through CoverMyMeds for Xhance and is currently pending approval/denial.  

## 2021-04-07 ENCOUNTER — Encounter: Payer: Self-pay | Admitting: Internal Medicine

## 2021-04-07 ENCOUNTER — Telehealth: Payer: Self-pay | Admitting: Allergy & Immunology

## 2021-04-07 ENCOUNTER — Encounter: Payer: Self-pay | Admitting: Allergy & Immunology

## 2021-04-07 NOTE — Telephone Encounter (Signed)
Pt calling in to see if Dr. Laural Benes prescribed her Prednisone recently, I told patient that the last time that I see it prescribed was 10/07 she said that the pharmacy she used put an order in for 240 tablets and she's trying to see why, the pharmacy is online and called "[facility]" she's requesting a call back [phone]

## 2021-04-07 NOTE — Telephone Encounter (Signed)
[name] called in and states that she tried to order some medication through [facility] and was told Dr. Dellis Anes needed to send in a prescription.  I asked the patient what prescription was it and she stated Prednisone.  I told the patient, the only medication she gets from [facility] is [place].  Patient states she didn't need Xhance right now and that she needed Dr. Dellis Anes to send in t he prescription for Prednisone and that she already paid for it.  I asked her if Dr. Dellis Anes prescribed prednisone and she told me yes.  I looked over her last office note and I did not see anything mentioned about prednisone.  Patient then sent a copy of her email from [facility] where she purchased prednisone.  Patient is requesting prednisone prescription for 240 prednisone pills of 5mg .  Please advise

## 2021-04-07 NOTE — Telephone Encounter (Signed)
I called the patient she plans to call her pharmacy and see which doctor sent in the medication as well as contacting her PCP to see if it was them. I did inform her the Timmothy Sours is the only medication that is sent to [facility].

## 2021-04-07 NOTE — Telephone Encounter (Signed)
I have never prescribed prednisone for this patient. I have met her only once and I definitely never gave her prednisone. I have three patients on chronic prednisone and she is definitely not one of them.  Maybe this was her PCP?  Salvatore Marvel, MD [facility] [place] [facility] [place]

## 2021-04-08 ENCOUNTER — Encounter: Payer: Self-pay | Admitting: Allergy & Immunology

## 2021-04-10 ENCOUNTER — Telehealth: Payer: Self-pay | Admitting: Internal Medicine

## 2021-04-10 NOTE — Telephone Encounter (Signed)
Can you please have her come into the clinic or have a televisit. We have not diagnosed her with urticaria or any rash at her previous visit. Thank you

## 2021-04-10 NOTE — Telephone Encounter (Signed)
I am happy to do a televisit tomorrow

## 2021-04-10 NOTE — Telephone Encounter (Signed)
Copied from CRM [id number]. Topic: General - Other >> Apr 07, 2021  4:43 PM Marylen Ponto wrote: Reason for CRM: Pt wanted to make Dr. Laural Benes aware that she will be refunded for the prescription from [facility].

## 2021-04-10 NOTE — Telephone Encounter (Signed)
noted 

## 2021-04-11 ENCOUNTER — Other Ambulatory Visit: Payer: Self-pay

## 2021-04-11 ENCOUNTER — Ambulatory Visit: Payer: BC Managed Care – PPO | Admitting: Family Medicine

## 2021-04-11 NOTE — Progress Notes (Deleted)
RE: Janice Blake MRN: [id number] DOB: 24-Jul-1970 Date of Telemedicine Visit: 04/11/2021  Referring provider: Marcine Matar, MD Primary care provider: Marcine Matar, MD  Chief Complaint: No chief complaint on file.   Telemedicine Follow Up Visit via Telephone: I connected with Janice Blake for a follow up on 04/11/21 by telephone and verified that I am speaking with the correct person using two identifiers.   I discussed the limitations, risks, security and privacy concerns of performing an evaluation and management service by telephone and the availability of in person appointments. I also discussed with the patient that there may be a patient responsible charge related to this service. The patient expressed understanding and agreed to proceed.  Patient is at *** accompanied by *** who provided/contributed to the history.  Provider is at the office.  Visit start time: *** Visit end time: *** Insurance consent/check in by: *** Medical consent and medical assistant/nurse: ***  History of Present Illness: She is a 50 y.o. female, who is being followed for asthma, chronic rhinitis, nasal polyposis, atopic dermatitis, and anemia. Her previous allergy office visit was on 03/23/2021 with Dr. Dellis Anes.   ***  Assessment and Plan: [name] is a 50 y.o. female with: There are no Patient Instructions on file for this visit.  No follow-ups on file.  No orders of the defined types were placed in this encounter.  Lab Orders  No laboratory test(s) ordered today    Diagnostics: None.  Medication List:  Current Outpatient Medications  Medication Sig Dispense Refill   albuterol (VENTOLIN HFA) 108 (90 Base) MCG/ACT inhaler Inhale 2 puffs into the lungs every 6 (six) hours as needed for wheezing or shortness of breath. 8 g 6   betamethasone dipropionate (DIPROLENE) 0.05 % ointment APPLY LIBERALLY TO AFFECTED AREA TWICE DAILY FOR 2 WEEKS. ALTERNATE WITH PROTOPIC (TACROLIMUS)  45 g 0   Budeson-Glycopyrrol-Formoterol (BREZTRI AEROSPHERE) 160-9-4.8 MCG/ACT AERO Inhale 2 puffs into the lungs in the morning and at bedtime. 10.7 g 5   cetirizine (ZYRTEC) 10 MG chewable tablet Chew 10 mg by mouth daily.     fluticasone (FLONASE) 50 MCG/ACT nasal spray USE TWO SPRAY(S) IN EACH NOSTRIL ONCE DAILY 16 g 5   Fluticasone Propionate (XHANCE) 93 MCG/ACT EXHU Place 1 spray into the nose 2 (two) times daily as needed. 16 mL 5   mometasone-formoterol (DULERA) 200-5 MCG/ACT AERO Inhale 2 puffs into the lungs 2 (two) times daily. 13 g 6   montelukast (SINGULAIR) 10 MG tablet TAKE 1 TABLET BY MOUTH AT BEDTIME 30 tablet 2   triamcinolone (KENALOG) 0.025 % ointment Apply 1 application topically 2 (two) times daily. 30 g 2   No current facility-administered medications for this visit.   Allergies: Allergies  Allergen Reactions   Amoxicillin Hives and Itching   Levaquin [Levofloxacin In D5w] Hives and Itching   I reviewed her past medical history, social history, family history, and environmental history and no significant changes have been reported from previous visit on 03/23/2021.  Review of Systems Objective: Physical Exam Not obtained as encounter was done via telephone.   Previous notes and tests were reviewed.  I discussed the assessment and treatment plan with the patient. The patient was provided an opportunity to ask questions and all were answered. The patient agreed with the plan and demonstrated an understanding of the instructions.   The patient was advised to call back or seek an in-person evaluation if the symptoms worsen or if the condition fails to improve  as anticipated.  I provided *** minutes of non-face-to-face time during this encounter.  It was my pleasure to participate in Janice Blake's care today. Please feel free to contact me with any questions or concerns.   Sincerely,  Thermon Leyland, FNP

## 2021-04-11 NOTE — Patient Instructions (Incomplete)
Asthma Continue Breztri 2 puffs twice a day with a spacer to prevent cough or wheeze Continue montelukast 10 mg once a day to prevent cough or wheeze Continue albuterol 2 puffs once every 4 hours as needed for cough or wheeze You may use albuterol 2 puffs 5 to 15 minutes to decrease cough or wheeze  Chronic rhinitis Continue cetirizine 10 mg once a day as needed for runny nose or itch Continue Xhance 2 sprays in each nostril twice a day for stuffy nose Consider saline nasal rinses as needed for nasal symptoms. Use this before any medicated nasal sprays for best result  Nasal polyposis Continue Xhance as listed above Consider Dupixent injections for control of nasal polyposis  Atopic dermatitis Continue with twice a day moisturizing routine Continue triamcinolone 0.  1% ointment twice a day as needed to red itchy areas below your face.  Do not use this medication longer than 2 weeks in a row  Anemia Follow-up with your primary care provider for recommendations regarding anemia  Call the clinic if this treatment plan is not working well for you.  Follow up in *** or sooner if needed.

## 2021-04-19 NOTE — Telephone Encounter (Signed)
PA has been approved through [facility] electronically and is good until 03/29/2022.

## 2021-04-23 ENCOUNTER — Encounter: Payer: Self-pay | Admitting: Allergy & Immunology

## 2021-04-25 ENCOUNTER — Encounter: Payer: Self-pay | Admitting: Allergy & Immunology

## 2021-04-25 ENCOUNTER — Other Ambulatory Visit: Payer: Self-pay

## 2021-04-25 ENCOUNTER — Ambulatory Visit (INDEPENDENT_AMBULATORY_CARE_PROVIDER_SITE_OTHER): Payer: BC Managed Care – PPO | Admitting: Allergy & Immunology

## 2021-04-25 DIAGNOSIS — J4540 Moderate persistent asthma, uncomplicated: Secondary | ICD-10-CM | POA: Diagnosis not present

## 2021-04-25 DIAGNOSIS — J45909 Unspecified asthma, uncomplicated: Secondary | ICD-10-CM

## 2021-04-25 DIAGNOSIS — L2089 Other atopic dermatitis: Secondary | ICD-10-CM

## 2021-04-25 DIAGNOSIS — J310 Chronic rhinitis: Secondary | ICD-10-CM

## 2021-04-25 DIAGNOSIS — J339 Nasal polyp, unspecified: Secondary | ICD-10-CM

## 2021-04-25 DIAGNOSIS — Z886 Allergy status to analgesic agent status: Secondary | ICD-10-CM

## 2021-04-25 NOTE — Progress Notes (Signed)
RE: Janice Blake MRN: [id number] DOB: 11-05-1970 Date of Telemedicine Visit: 04/25/2021  Referring provider: Marcine Matar, MD Primary care provider: Marcine Matar, MD  Chief Complaint: Asthma   Telemedicine Follow Up Visit via Telephone: I connected with [name] for a follow up on 04/25/21 by telephone and verified that I am speaking with the correct person using two identifiers.   I discussed the limitations, risks, security and privacy concerns of performing an evaluation and management service by telephone and the availability of in person appointments. I also discussed with the patient that there may be a patient responsible charge related to this service. The patient expressed understanding and agreed to proceed.  Patient is at home.  Provider is at the office.  Visit start time: 4:06 PM Visit end time: 4:26 PM Insurance consent/check in by: [facility] consent and medical assistant/nurse: Ashleigh  History of Present Illness:  She is a 51 y.o. female, who is being followed for moderate persistent asthma as well as chronic rhinitis and nasal polyps. Her previous allergy office visit was in December 2022 with myself. At that time, we changed her from [facility] to [facility] as well as albuterol as needed. For her rhinitis, testing was negative to everything, including the positive control. We stopped the Flonase and started [place] instead. For her nasal polyps, we recommended starting Dupixent for long term control. We obtained some lab work including an environmental allergy panel.   Labs demonstrated an elevated AEC of 300. Environmental allergy panel was negative, Aspergillus precipitins were negative. ANCA titers were negative, and AAT level was 197.   Since the last visit, she has mostly done well. She had a breakout on her back. She has been using Cerve. This seems to be helping. She has not changed anything at all including her detergents or soaps or shampoos.    Asthma/Respiratory Symptom History: Her asthma is under fair control with the Breztri two puffs twice daily.  She has not noticed any improvement over [facility]. She has not been to the hospital for her breathing. She has not had any issues requiring going to the hospital. She really wants to just be on prednisone all of the time. Evidently before starting her care with Dr. Laural Benes, she was on prednisone more than off of prednisone. She remains on the montelukast as well.   Allergic Rhinitis Symptom History: Nasal polyps are "up and down". This is typical for her. She has not needed prednisone since I saw her last time.  She is using the [place], which she feels is working OK for her. It caused some burning initially but she has worked through that.   Eczema Symptom Symptom History: Eczema is under fairly good control with the topical steroids. She has not needed any antibiotics or systemic steroids for any breakouts.   Otherwise, there have been no changes to her past medical history, surgical history, family history, or social history.  Assessment and Plan:  [name] is a 51 y.o. female with:  Moderate persistent asthma - with AERD   Chronic rhinitis - with non reactive histamine today (confirming with blood work)   Nasal polyps - s/p 2 polypectomies and requiring 5 courses of prednisone per year   Atopic dermatitis    We are not going to make any changes at this time. I still think that Dupixent would be the best next step for her, but she is not convinced at this point. I did discuss the side effects of prednisone and  our goal to decrease the need for prednisone bursts. She seems to buy into this. We are going to see her in person at the next visit so that we can get a spirometry to see where those numbers are heading.   Diagnostics: None.  Medication List:  Current Outpatient Medications  Medication Sig Dispense Refill   albuterol (VENTOLIN HFA) 108 (90 Base) MCG/ACT inhaler Inhale  2 puffs into the lungs every 6 (six) hours as needed for wheezing or shortness of breath. 8 g 6   betamethasone dipropionate (DIPROLENE) 0.05 % ointment APPLY LIBERALLY TO AFFECTED AREA TWICE DAILY FOR 2 WEEKS. ALTERNATE WITH PROTOPIC (TACROLIMUS) 45 g 0   Budeson-Glycopyrrol-Formoterol (BREZTRI AEROSPHERE) 160-9-4.8 MCG/ACT AERO Inhale 2 puffs into the lungs in the morning and at bedtime. 10.7 g 5   cetirizine (ZYRTEC) 10 MG chewable tablet Chew 10 mg by mouth daily.     Fluticasone Propionate (XHANCE) 93 MCG/ACT EXHU Place 1 spray into the nose 2 (two) times daily as needed. 16 mL 5   montelukast (SINGULAIR) 10 MG tablet TAKE 1 TABLET BY MOUTH AT BEDTIME 30 tablet 2   triamcinolone (KENALOG) 0.025 % ointment Apply 1 application topically 2 (two) times daily. 30 g 2   No current facility-administered medications for this visit.   Allergies: Allergies  Allergen Reactions   Amoxicillin Hives and Itching   Levaquin [Levofloxacin In D5w] Hives and Itching   I reviewed her past medical history, social history, family history, and environmental history and no significant changes have been reported from previous visits.  Review of Systems  Constitutional:  Negative for activity change, appetite change, chills, diaphoresis and fatigue.  HENT:  Positive for congestion and rhinorrhea. Negative for postnasal drip, sinus pressure and sore throat.   Eyes:  Negative for pain, discharge, redness and itching.  Respiratory:  Positive for cough. Negative for shortness of breath, wheezing and stridor.   Gastrointestinal:  Negative for diarrhea, nausea and vomiting.  Endocrine: Negative for cold intolerance and heat intolerance.  Musculoskeletal:  Negative for arthralgias, joint swelling and myalgias.  Skin:  Negative for rash.  Allergic/Immunologic: Negative for environmental allergies and food allergies.   Objective:  Physical exam not obtained as encounter was done via telephone.   Previous notes  and tests were reviewed.  I discussed the assessment and treatment plan with the patient. The patient was provided an opportunity to ask questions and all were answered. The patient agreed with the plan and demonstrated an understanding of the instructions.   The patient was advised to call back or seek an in-person evaluation if the symptoms worsen or if the condition fails to improve as anticipated.  I provided 20 minutes of non-face-to-face time during this encounter.  It was my pleasure to participate in [name] Fiumara's care today. Please feel free to contact me with any questions or concerns.   Sincerely,  Alfonse Spruce, MD

## 2021-04-26 ENCOUNTER — Other Ambulatory Visit: Payer: Self-pay | Admitting: *Deleted

## 2021-04-26 ENCOUNTER — Encounter: Payer: Self-pay | Admitting: Allergy & Immunology

## 2021-04-26 DIAGNOSIS — J4540 Moderate persistent asthma, uncomplicated: Secondary | ICD-10-CM

## 2021-04-26 MED ORDER — MONTELUKAST SODIUM 10 MG PO TABS: 10.0000 mg | ORAL_TABLET | Freq: Every day | ORAL | 5 refills | Status: DC

## 2021-04-26 NOTE — Telephone Encounter (Signed)
Did you want to give the patient refills of Montelukast and Singulair?

## 2021-05-06 ENCOUNTER — Encounter: Payer: Self-pay | Admitting: Allergy & Immunology

## 2021-05-07 ENCOUNTER — Encounter: Payer: Self-pay | Admitting: Allergy & Immunology

## 2021-05-08 ENCOUNTER — Other Ambulatory Visit: Payer: Self-pay | Admitting: *Deleted

## 2021-05-08 MED ORDER — TACROLIMUS 0.1 % EX OINT: TOPICAL_OINTMENT | Freq: Two times a day (BID) | CUTANEOUS | 5 refills | Status: DC | PRN

## 2021-05-12 ENCOUNTER — Encounter: Payer: Self-pay | Admitting: Allergy & Immunology

## 2021-05-16 ENCOUNTER — Encounter: Payer: Self-pay | Admitting: Allergy & Immunology

## 2021-05-19 ENCOUNTER — Ambulatory Visit (INDEPENDENT_AMBULATORY_CARE_PROVIDER_SITE_OTHER): Payer: BC Managed Care – PPO | Admitting: Allergy & Immunology

## 2021-05-19 ENCOUNTER — Other Ambulatory Visit: Payer: Self-pay

## 2021-05-19 VITALS — Ht 69.0 in | Wt 190.0 lb

## 2021-05-19 DIAGNOSIS — L2089 Other atopic dermatitis: Secondary | ICD-10-CM

## 2021-05-19 DIAGNOSIS — J4540 Moderate persistent asthma, uncomplicated: Secondary | ICD-10-CM | POA: Diagnosis not present

## 2021-05-19 DIAGNOSIS — L281 Prurigo nodularis: Secondary | ICD-10-CM

## 2021-05-19 DIAGNOSIS — J310 Chronic rhinitis: Secondary | ICD-10-CM

## 2021-05-19 DIAGNOSIS — J339 Nasal polyp, unspecified: Secondary | ICD-10-CM

## 2021-05-19 DIAGNOSIS — Z886 Allergy status to analgesic agent status: Secondary | ICD-10-CM

## 2021-05-19 DIAGNOSIS — J45909 Unspecified asthma, uncomplicated: Secondary | ICD-10-CM

## 2021-05-19 MED ORDER — TRIAMCINOLONE ACETONIDE 0.1 % EX OINT: 1.0000 "application " | TOPICAL_OINTMENT | Freq: Two times a day (BID) | CUTANEOUS | 1 refills | Status: DC

## 2021-05-19 MED ORDER — PREDNISONE 10 MG PO TABS: ORAL_TABLET | ORAL | 0 refills | Status: DC

## 2021-05-19 NOTE — Progress Notes (Signed)
RE: Janice Blake MRN: [id number] DOB: 1970-11-21 Date of Telemedicine Visit: 05/19/2021  Referring provider: Marcine Matar, MD Primary care provider: Marcine Matar, MD  Chief Complaint: Asthma (Says her asthma is well. No issues or concerns. ), Nasal Polyps (Having issues. ), and Allergic Rhinitis  (Says after her skin testing she is having issues. Breaking out on her back. Sent pictures via my chart. )   Telemedicine Follow Up Visit via Telephone: I connected with Janice Blake for a follow up on 05/23/21 by telephone and verified that I am speaking with the correct person using two identifiers.   I discussed the limitations, risks, security and privacy concerns of performing an evaluation and management service by telephone and the availability of in person appointments. I also discussed with the patient that there may be a patient responsible charge related to this service. The patient expressed understanding and agreed to proceed.  Patient is at home.  Provider is at the office.  Visit start time: 3:45 PM Visit end time: 4:11 PM Insurance consent/check in by: [facility] consent and medical assistant/nurse: Dr. Reece Agar  History of Present Illness:  She is a 51 y.o. female, who is being followed for moderate persistent asthma as well as chronic rhinitis and nasal polyposis and atopic dermatitis. Her previous allergy office visit was in January 2023 with myself.  At that time, we did not make any changes.  We still discussed Dupixent as a means of long-term control.  She was insisting on prednisone, but we held off because she had been on it for a number of years.  We are planning to do a spirometry at the next visit.  In the interim, she has contacted [place] and reported that her nasal polyps were "agitated".  We recommended doing the nasal spray on a more routine basis.       She is continuing to have these lesions have been all over her body. She has been using her Cerve  and Vaseline. She was unsure what else should could use.  This has not been helping very much.  She does have some tacrolimus which she has been using as well as over-the-counter moisturizers.  She does not have a nasal steroid.  She saw [facility] for the dermatitis on her leg. She does not have a scheduled appointment for them.   Nasal polyps have been largely controlled.  They were "agitated" last week, but otherwise no major issues.  Otherwise, there have been no changes to her past medical history, surgical history, family history, or social history.  Assessment and Plan:  Ravon is a 51 y.o. 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  We are going to send in triamcinolone ointment to use as needed.  Hopefully this will be more effective than the tacrolimus.  Her lesions almost appear like prurigo nodularis.  This would be improved with the use of Dupixent as well.  She is still hesitant to start it.  Diagnostics: None.  Medication List:  Current Outpatient Medications  Medication Sig Dispense Refill   albuterol (VENTOLIN HFA) 108 (90 Base) MCG/ACT inhaler Inhale 2 puffs into the lungs every 6 (six)  hours as needed for wheezing or shortness of breath. 8 g 6   betamethasone dipropionate (DIPROLENE) 0.05 % ointment APPLY LIBERALLY TO AFFECTED AREA TWICE DAILY FOR 2 WEEKS. ALTERNATE WITH PROTOPIC (TACROLIMUS) 45 g 0   Budeson-Glycopyrrol-Formoterol (BREZTRI AEROSPHERE) 160-9-4.8 MCG/ACT AERO Inhale 2 puffs into the lungs in the morning and at bedtime. 10.7 g 5   cetirizine (ZYRTEC) 10 MG chewable tablet Chew 10 mg by mouth daily.     Fluticasone Propionate (XHANCE) 93  MCG/ACT EXHU Place 1 spray into the nose 2 (two) times daily as needed. 16 mL 5   montelukast (SINGULAIR) 10 MG tablet Take 1 tablet (10 mg total) by mouth at bedtime. 30 tablet 5   predniSONE (DELTASONE) 10 MG tablet Take two tablets (20mg ) twice daily for three days, then one tablet (10mg ) twice daily for three days, then STOP. 18 tablet 0   tacrolimus (PROTOPIC) 0.1 % ointment Apply topically 2 (two) times daily as needed. 100 g 5   triamcinolone ointment (KENALOG) 0.1 % Apply 1 application topically 2 (two) times daily. Avoid the face and arm pits. 454 g 1   No current facility-administered medications for this visit.   Allergies: Allergies  Allergen Reactions   Amoxicillin Hives and Itching   Levaquin [Levofloxacin In D5w] Hives and Itching   I reviewed her past medical history, social history, family history, and environmental history and no significant changes have been reported from previous visits.  Review of Systems  Constitutional: Negative.  Negative for fever.  HENT: Negative.  Negative for congestion, ear discharge and ear pain.   Eyes:  Negative for pain, discharge, redness and itching.  Respiratory:  Negative for cough, shortness of breath and wheezing.   Cardiovascular: Negative.  Negative for chest pain and palpitations.  Gastrointestinal:  Negative for abdominal pain.  Endocrine: Negative for cold intolerance and heat intolerance.  Skin:  Positive for rash.  Allergic/Immunologic: Negative for environmental allergies and food allergies.  Neurological:  Negative for dizziness and headaches.  Hematological:  Does not bruise/bleed easily.   Objective:  Physical exam not obtained as encounter was done via telephone.   Previous notes and tests were reviewed.  I discussed the assessment and treatment plan with the patient. The patient was provided an opportunity to ask questions and all were answered. The patient agreed with the plan and demonstrated an understanding of  the instructions.   The patient was advised to call back or seek an in-person evaluation if the symptoms worsen or if the condition fails to improve as anticipated.  I provided 26 minutes of non-face-to-face time during this encounter.  It was my pleasure to participate in [place]'s care today. Please feel free to contact me with any questions or concerns.   Sincerely,  , MD

## 2021-05-23 ENCOUNTER — Encounter: Payer: Self-pay | Admitting: Allergy & Immunology

## 2021-06-05 ENCOUNTER — Encounter: Payer: Self-pay | Admitting: Allergy & Immunology

## 2021-06-07 ENCOUNTER — Other Ambulatory Visit: Payer: Self-pay | Admitting: *Deleted

## 2021-06-07 MED ORDER — DOXYCYCLINE MONOHYDRATE 100 MG PO CAPS: 100.0000 mg | ORAL_CAPSULE | Freq: Two times a day (BID) | ORAL | 0 refills | Status: AC

## 2021-06-21 MED ORDER — CLOBETASOL PROPIONATE 0.05 % EX OINT: 1.0000 "application " | TOPICAL_OINTMENT | Freq: Two times a day (BID) | CUTANEOUS | 0 refills | Status: DC

## 2021-06-24 ENCOUNTER — Other Ambulatory Visit: Payer: Self-pay | Admitting: Allergy & Immunology

## 2021-06-27 NOTE — Telephone Encounter (Signed)
Called and spoke with patient in regards to why she requested the Prednisone. She stated that for over a week now she has had inflammation with her nasal polyps  resulting in difficulty smelling, and sinus pressure between her eyes. She states that she is taking her medications and using her Xhance. She states that Prednisone helps her when the pollen season starts. Is this something you would like to send in for the patient?  ?

## 2021-06-30 NOTE — Telephone Encounter (Signed)
I called and spoke with the patient and advised of Dr. Gillermina Hu feelings about refilling the Prednisone and wanted her to start [place]. Patient was upset and stated that there are side effects with both the Prednisone and the Dupixent, she stated that the [facility] did not work for her and that she has already had surgery in the past to help with her nasal polyps. I apologized to the patient for her being upset. Patient verbalized understanding and stated that she will figure something else out.  ?

## 2021-07-11 ENCOUNTER — Ambulatory Visit: Payer: BC Managed Care – PPO | Admitting: Allergy & Immunology

## 2021-07-11 DIAGNOSIS — J309 Allergic rhinitis, unspecified: Secondary | ICD-10-CM

## 2021-07-14 ENCOUNTER — Encounter: Payer: Self-pay | Admitting: Internal Medicine

## 2021-07-15 ENCOUNTER — Other Ambulatory Visit: Payer: Self-pay | Admitting: Allergy & Immunology

## 2021-07-20 ENCOUNTER — Encounter: Payer: Self-pay | Admitting: Allergy & Immunology

## 2021-07-20 ENCOUNTER — Other Ambulatory Visit: Payer: Self-pay | Admitting: Allergy & Immunology

## 2021-07-20 DIAGNOSIS — J339 Nasal polyp, unspecified: Secondary | ICD-10-CM

## 2021-07-21 ENCOUNTER — Telehealth: Payer: Self-pay

## 2021-07-21 MED ORDER — CEFDINIR 300 MG PO CAPS: 300.0000 mg | ORAL_CAPSULE | Freq: Two times a day (BID) | ORAL | 0 refills | Status: AC

## 2021-07-21 NOTE — Telephone Encounter (Signed)
Patient contacted to schedule mammogram.  ? ?RE: Mobile Mammo event located at: ? ?[email or web address]  ?      ?[address]    ?[place] [address]    ? ?Date: April 7th  ? ?Patient declined to schedule for event at this time.  ?

## 2021-07-21 NOTE — Addendum Note (Signed)
Addended by: Alfonse Spruce on: 07/21/2021 06:47 AM ? ? Modules accepted: Orders ? ?

## 2021-07-25 NOTE — Telephone Encounter (Signed)
Placed referral to Dr. Constance Holster at [place] [place] scheduled an appointment for patient with Dr. Constance Holster. Patient is currently scheduled for 10-05-2021 at 1pm with Dr. Constance Holster. I was told patient could see someone else if she wished to at their office since she has been seen there before.  ? ?[facility] [place] ?Formerly known as [facility] [place] [facility] [employer] ? ?[address] ?[address] ?[place], [place] [address] ?[phone] ? ?Called patient to advise her of referral placement and appointment. Patient stated she could not talk right now and requested I send her a mychart message with information.  ? ?[employer]. ?

## 2021-08-22 ENCOUNTER — Encounter: Payer: Self-pay | Admitting: Allergy & Immunology

## 2021-08-22 MED ORDER — MUPIROCIN 2 % EX OINT: 1.0000 "application " | TOPICAL_OINTMENT | Freq: Two times a day (BID) | CUTANEOUS | 1 refills | Status: DC | PRN

## 2021-08-22 NOTE — Progress Notes (Signed)
Bactroban sent into the pharmacy.  ? ?Malachi Bonds, MD ?[facility] ? ?

## 2021-11-10 ENCOUNTER — Encounter: Payer: Self-pay | Admitting: Allergy & Immunology

## 2021-11-10 ENCOUNTER — Other Ambulatory Visit: Payer: Self-pay | Admitting: Allergy & Immunology

## 2021-11-10 ENCOUNTER — Encounter: Payer: Self-pay | Admitting: Internal Medicine

## 2021-11-13 ENCOUNTER — Other Ambulatory Visit: Payer: Self-pay | Admitting: *Deleted

## 2021-11-13 MED ORDER — MONTELUKAST SODIUM 10 MG PO TABS: 10.0000 mg | ORAL_TABLET | Freq: Every day | ORAL | 5 refills | Status: DC

## 2021-12-11 DIAGNOSIS — L281 Prurigo nodularis: Secondary | ICD-10-CM | POA: Diagnosis not present

## 2021-12-11 DIAGNOSIS — L259 Unspecified contact dermatitis, unspecified cause: Secondary | ICD-10-CM | POA: Diagnosis not present

## 2021-12-11 DIAGNOSIS — L309 Dermatitis, unspecified: Secondary | ICD-10-CM | POA: Diagnosis not present

## 2022-03-10 ENCOUNTER — Other Ambulatory Visit: Payer: Self-pay | Admitting: Allergy & Immunology

## 2022-04-19 ENCOUNTER — Other Ambulatory Visit: Payer: Self-pay | Admitting: Allergy & Immunology

## 2022-04-19 NOTE — Telephone Encounter (Signed)
Patient needs an appointment

## 2022-04-19 NOTE — Telephone Encounter (Signed)
I called the patient to inform her she would need an appointment to evaluate symptoms. Patient's mailbox is full and I was not able to leave a message.

## 2022-04-25 NOTE — Telephone Encounter (Signed)
Agree - she does not need to be on chronic prednisone. Can she get in to see someone anytime this week?   Salvatore Marvel, MD [facility] [place] [facility] [place]

## 2022-04-26 NOTE — Telephone Encounter (Signed)
Patient made an appointment for 05/01/22 at 3pm with Dr. Ernst Bowler for further evaluation.

## 2022-04-30 NOTE — Telephone Encounter (Signed)
How am I supposed to evaluate her nasal polyps via video visit? The answer is no to that.   Salvatore Marvel, MD [facility] [place] [facility] [place]

## 2022-05-01 ENCOUNTER — Ambulatory Visit: Payer: BC Managed Care – PPO | Admitting: Allergy & Immunology

## 2022-05-30 ENCOUNTER — Other Ambulatory Visit: Payer: Self-pay | Admitting: Allergy & Immunology

## 2022-06-18 ENCOUNTER — Ambulatory Visit (INDEPENDENT_AMBULATORY_CARE_PROVIDER_SITE_OTHER): Payer: BC Managed Care – PPO | Admitting: Family

## 2022-06-18 ENCOUNTER — Other Ambulatory Visit: Payer: Self-pay

## 2022-06-18 ENCOUNTER — Encounter: Payer: Self-pay | Admitting: Family

## 2022-06-18 VITALS — BP 132/80 | HR 83 | Temp 98.3°F | Resp 16 | Wt 189.6 lb

## 2022-06-18 DIAGNOSIS — J339 Nasal polyp, unspecified: Secondary | ICD-10-CM | POA: Diagnosis not present

## 2022-06-18 DIAGNOSIS — L281 Prurigo nodularis: Secondary | ICD-10-CM | POA: Diagnosis not present

## 2022-06-18 DIAGNOSIS — J310 Chronic rhinitis: Secondary | ICD-10-CM

## 2022-06-18 DIAGNOSIS — Z886 Allergy status to analgesic agent status: Secondary | ICD-10-CM

## 2022-06-18 DIAGNOSIS — J4541 Moderate persistent asthma with (acute) exacerbation: Secondary | ICD-10-CM

## 2022-06-18 DIAGNOSIS — L2089 Other atopic dermatitis: Secondary | ICD-10-CM

## 2022-06-18 MED ORDER — MONTELUKAST SODIUM 10 MG PO TABS
10.0000 mg | ORAL_TABLET | Freq: Every day | ORAL | 5 refills | Status: DC
Start: 2022-06-18 — End: 2022-12-28

## 2022-06-18 MED ORDER — PREDNISONE 10 MG PO TABS: ORAL_TABLET | ORAL | 0 refills | Status: DC

## 2022-06-18 MED ORDER — BREZTRI AEROSPHERE 160-9-4.8 MCG/ACT IN AERO: 2.0000 | INHALATION_SPRAY | Freq: Two times a day (BID) | RESPIRATORY_TRACT | 45 refills | Status: DC

## 2022-06-18 MED ORDER — ALBUTEROL SULFATE HFA 108 (90 BASE) MCG/ACT IN AERS
2.0000 | INHALATION_SPRAY | Freq: Four times a day (QID) | RESPIRATORY_TRACT | 1 refills | Status: DC | PRN
Start: 2022-06-18 — End: 2022-09-25

## 2022-06-18 NOTE — Patient Instructions (Addendum)
1. Moderate persistent asthma, with acute exacerbation Start prednisone 10 mg taking 2 tablets twice a day for 3 days, then on the 4th day take 2 tablets in the morning and on the 5th day take one tablet and stop. Reviewed side effects of prednisone such as osteoporosis, avascular necorsis, reflux, cataracts, and alteration in mood and sleep - Daily controller medication(s): Breztri 2 puffs twice daily with spacer and Singulair 10 mg once a day. Refills sent - Prior to physical activity: albuterol 2 puffs 10-15 minutes before physical activity. - Rescue medications: albuterol 4 puffs every 4-6 hours as needed - Asthma control goals:  * Full participation in all desired activities (may need albuterol before activity) * Albuterol use two time or less a week on average (not counting use with activity) * Cough interfering with sleep two time or less a month * Oral steroids no more than once a year * No hospitalizations  2. Chronic rhinitis - Testing on 03/23/21 was negative to the entire panel, although the positive control was also nonreactive. - Restart Flonase (fluticasone) 2 sprays in each nostril once a day - You can use an extra dose of the antihistamine, if needed, for breakthrough symptoms.  - Consider nasal saline rinses 1-2 times daily to remove allergens from the nasal cavities as well as help with mucous clearance (this is especially helpful to do before the nasal sprays are given)  3. Nasal polyps - I would strongly consider starting Dupixent. - This is a drug that targets a specific pathway in the allergic response. - Start Flonase (fluticasone) 2 spryas in each nostril once a day. In the right nostril, point the applicator out toward the right ear. 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 She would like a referral for a second opinion -Recommend Dupixent but she does not wish to do  these injections do to the risk of anaphylaxis. Discussed that the risk for anaphylaxis is low - Recommend stopping natural soaps and creams. Recommend Vanicream lotion/soaps. Samples given - Recommend wearing long pants at night and keeping your nails trimmed short. Wear gloves to help your nails touching skin   5. Schedule a follow up appointment in 6 weeks with Dr. Ernst Bowler or sooner if needed

## 2022-06-18 NOTE — Progress Notes (Signed)
[place] [address] Dept: [phone]  FOLLOW UP NOTE  Patient ID: Janice Blake, female    DOB: [birth year]  Age: 52 y.o. MRN: [id number] Date of Office Visit: 06/18/2022  Assessment  Chief Complaint: Follow-up (Can't smell due to being sick with a cold. Patient stated that she is not interested in injections for her nasal polyps.) and Medication Refill  HPI Janice Blake is a 52 year old female who presents today for follow-up of moderate persistent asthma with AERD, chronic rhinitis, nasal polyposis, atopic dermatitis, and new onset rash-question prurigo nodularis.  She reports that since her last office visit she has been diagnosed with prurigo nodularis.  Moderate persistent asthma: She continues to take Breztri 2 puffs twice a day with spacer and Singulair 10 mg once a day.  She does mention that she has been out of Singulair for at least the past 6 days.  She has albuterol to use as needed.  Since Saturday she has had a dry cough.  She will also have wheezing at night when she lies down.  She also has had nocturnal awakenings lately due to breathing problems.  She denies fever,chills, tightness in her chest and shortness of breath.  Since her last office visit she has not received any systemic steroids or made any trips to the emergency room or urgent care due to breathing problems.  She has not used her albuterol in months until Sunday she used it and it did help with her symptoms.  Chronic rhinitis/ nasal polyposis: She reports that a couple months ago she had a really bad cold and since then she is not been able to smell.  She can taste, but not completely.  She reports postnasal drip and denies rhinorrhea.  She has not used Flonase nasal spray recently.  She used it the last time when she had a cold.  She is no longer using Xhance nasal spray.  She does take Zyrtec 10 mg once a day and uses Vicks shower tab to help open her up.  She has not had any sinus infections  since we last saw her.  She does mention at times her ears will get stopped up, but they clear up as the day goes on.  She has had 2 sinus surgeries due to nasal polyps.  She reports that before one of the surgeries you could see the polyp coming out of her nose.  The last time she saw ear nose and throat was probably around 2022 or 2023.  She mentions that she does not want to do Dupixent injections for nasal polyps.  She reports that she cannot "get past the risk for anaphylaxis."  Discussed that Dupixent could help her prurigo nodularis, nasal polyposis, and asthma.  She is still not interested.  Dermatitis/prurigo nodularis: She reports that the area on her left leg is clearing up and getting better.  She reports the area on her left leg has been this way for 5 years.  She is now using all-natural soaps and creams.  She is using in NEEM products.  Reports that she last saw her dermatologist, Dr. Ouida Sills, with [facility] approximately 8 months ago and that is when they did a biopsy.  The areas itch sometimes.  She does wear pants at night and sleeping, but she still does scratch and pick at the areas.  She has not had any skin infections since we last saw her.  Triamcinolone to use as needed.  She is interested in  a second opinion.  She feels like her back broke out after skin testing that was done by our office 2022.   Drug Allergies:  Allergies  Allergen Reactions   Amoxicillin Hives and Itching   Levaquin [Levofloxacin In D5w] Hives and Itching    Review of Systems: Review of Systems  Constitutional:  Negative for chills and fever.  HENT:         Reports nasal congestion, post nasal drip and anosmia. Her taste is altered also. Denies rhinorrhea  Eyes:        Denies itchy watery eyes  Respiratory:  Positive for cough and wheezing. Negative for shortness of breath.        Reports dry cough that started Saturday and wheezing when lying down. Also reports nocturnal awakenings due to  breathing problems. Denies tightness in chest and shortness of breath.   Cardiovascular:  Negative for chest pain and palpitations.  Gastrointestinal:        Denies heartburn or reflux  Genitourinary:  Negative for frequency.  Skin:  Positive for itching and rash.  Neurological:  Negative for headaches.  Endo/Heme/Allergies:  Negative for environmental allergies.     Physical Exam: BP 132/80   Pulse 83   Temp 98.3 F (36.8 C) (Temporal)   Resp 16   Wt 189 lb 9.6 oz (86 kg)   SpO2 97%   BMI 28.00 kg/m    Physical Exam Constitutional:      Appearance: Normal appearance.  HENT:     Head: Normocephalic and atraumatic.     Comments: Pharynx normal. Eyes normal. Ears normal. Nose: polyp noted in left nasal passage    Right Ear: Tympanic membrane, ear canal and external ear normal.     Left Ear: Tympanic membrane, ear canal and external ear normal.     Mouth/Throat:     Mouth: Mucous membranes are moist.     Pharynx: Oropharynx is clear.  Eyes:     Conjunctiva/sclera: Conjunctivae normal.  Cardiovascular:     Rate and Rhythm: Normal rate and regular rhythm.     Heart sounds: Normal heart sounds.  Pulmonary:     Effort: Pulmonary effort is normal.     Comments: Wheezing heard throughout all lung fields. Lungs clear throughout after 4 puffs of Xopenex Musculoskeletal:     Cervical back: Neck supple.  Skin:    General: Skin is warm.     Comments: Open area with no oozing, erythyema, or bleeding noted on left leg (see photo). Hyperpigmented nodules noted on left leg.multiple hyperpigmented areas noted on back.  Neurological:     Mental Status: She is alert and oriented to person, place, and time.  Psychiatric:        Mood and Affect: Mood normal.        Behavior: Behavior normal.        Thought Content: Thought content normal.        Judgment: Judgment normal.     Diagnostics:  FVC 2.12 L ( 61%), FEV1 1.21 L ( 44%). Spirometry indicates severe airway obstruction.  Possible mixed defect. Post bronchodilator response shows FVC 2.25 L ( 65%), FEV1 1.24 L (45%). There is a 2 % change in FEV1   Assessment and Plan: 1. Moderate persistent asthma with (acute) exacerbation   2. Nasal polyps   3. Prurigo nodularis   4. Chronic rhinitis   5. Flexural atopic dermatitis   6. Aspirin-exacerbated respiratory disease (AERD)     Meds ordered this encounter  Medications   montelukast (SINGULAIR) 10 MG tablet    Sig: Take 1 tablet (10 mg total) by mouth at bedtime.    Dispense:  30 tablet    Refill:  5   albuterol (VENTOLIN HFA) 108 (90 Base) MCG/ACT inhaler    Sig: Inhale 2 puffs into the lungs every 6 (six) hours as needed for wheezing or shortness of breath.    Dispense:  8 g    Refill:  1   Budeson-Glycopyrrol-Formoterol (BREZTRI AEROSPHERE) 160-9-4.8 MCG/ACT AERO    Sig: Inhale 2 puffs into the lungs in the morning and at bedtime.    Dispense:  5.9 g    Refill:  45   predniSONE (DELTASONE) 10 MG tablet    Sig: Take 2 tablets twice a day for 3 days, then on the 4th day take 2 tablets in the morning, and on the 5th day take one tablet and stop    Dispense:  15 tablet    Refill:  0    Patient Instructions  1. Moderate persistent asthma, with acute exacerbation Start prednisone 10 mg taking 2 tablets twice a day for 3 days, then on the 4th day take 2 tablets in the morning and on the 5th day take one tablet and stop. Reviewed side effects of prednisone such as osteoporosis, avascular necorsis, reflux, cataracts, and alteration in mood and sleep - Daily controller medication(s): Breztri 2 puffs twice daily with spacer and Singulair 10 mg once a day. Refills sent - Prior to physical activity: albuterol 2 puffs 10-15 minutes before physical activity. - Rescue medications: albuterol 4 puffs every 4-6 hours as needed - Asthma control goals:  * Full participation in all desired activities (may need albuterol before activity) * Albuterol use two time or less a  week on average (not counting use with activity) * Cough interfering with sleep two time or less a month * Oral steroids no more than once a year * No hospitalizations  2. Chronic rhinitis - Testing on 03/23/21 was negative to the entire panel, although the positive control was also nonreactive. - Restart Flonase (fluticasone) 2 sprays in each nostril once a day - You can use an extra dose of the antihistamine, if needed, for breakthrough symptoms.  - Consider nasal saline rinses 1-2 times daily to remove allergens from the nasal cavities as well as help with mucous clearance (this is especially helpful to do before the nasal sprays are given)  3. Nasal polyps - I would strongly consider starting Dupixent. - This is a drug that targets a specific pathway in the allergic response. - Start Flonase (fluticasone) 2 spryas in each nostril once a day. In the right nostril, point the applicator out toward the right ear. 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 She would like a referral for a second opinion -Recommend Dupixent but she does not wish to do these injections do to the risk of anaphylaxis. Discussed that the risk for anaphylaxis is low - Recommend stopping natural soaps and creams. Recommend Vanicream lotion/soaps. Samples given - Recommend wearing long pants at night and keeping your nails trimmed short. Wear gloves to help your nails touching skin   5. Schedule a follow up appointment in 6 weeks with Dr. Ernst Bowler or sooner if needed     Return in about 6 weeks (around 07/30/2022), or if symptoms worsen or fail to improve.  Thank you for the opportunity to care for this patient.  Please do not hesitate to contact me with questions.  Althea Charon, FNP [facility]

## 2022-07-31 ENCOUNTER — Other Ambulatory Visit: Payer: Self-pay | Admitting: Allergy & Immunology

## 2022-08-09 ENCOUNTER — Ambulatory Visit: Payer: BC Managed Care – PPO | Admitting: Allergy & Immunology

## 2022-08-24 ENCOUNTER — Telehealth: Payer: Self-pay | Admitting: Allergy & Immunology

## 2022-08-24 NOTE — Telephone Encounter (Signed)
Called the patient and she was mad at me for trying to schedule her for Monday 08/27/22 with Amada Jupiter. She stated she did not want to die and will go to urgent care. She is having shortness of breath, which I advised her before trying to schedule an appointment if she is having issues with her breathing she may need to go to urgent care or the emergency room. Patient hung up on me.

## 2022-08-24 NOTE — Telephone Encounter (Signed)
Chrissie please advise if Prednisone can be sent in.

## 2022-08-24 NOTE — Telephone Encounter (Signed)
She needs to schedule an office visit or go to urgent care

## 2022-08-24 NOTE — Telephone Encounter (Signed)
Patient states she is having an asthma flare since last night. She is having a hard time breathing and feels very short out of breath. Her inhalers are not helping her. She is requesting Prednisone sent in to [facility] on [address].

## 2022-08-25 ENCOUNTER — Ambulatory Visit (HOSPITAL_COMMUNITY)
Admission: EM | Admit: 2022-08-25 | Discharge: 2022-08-25 | Disposition: A | Discharge status: Home / Self Care | Payer: BC Managed Care – PPO | Attending: Emergency Medicine | Admitting: Emergency Medicine

## 2022-08-25 ENCOUNTER — Encounter (HOSPITAL_COMMUNITY): Payer: Self-pay

## 2022-08-25 DIAGNOSIS — J4541 Moderate persistent asthma with (acute) exacerbation: Secondary | ICD-10-CM

## 2022-08-25 DIAGNOSIS — J45998 Other asthma: Secondary | ICD-10-CM | POA: Diagnosis not present

## 2022-08-25 MED ORDER — METHYLPREDNISOLONE SODIUM SUCC 125 MG IJ SOLR
INTRAMUSCULAR | Status: AC
  Filled 2022-08-25: qty 2

## 2022-08-25 MED ORDER — PREDNISONE 20 MG PO TABS: 40.0000 mg | ORAL_TABLET | Freq: Every day | ORAL | 0 refills | Status: AC

## 2022-08-25 MED ORDER — ALBUTEROL SULFATE (2.5 MG/3ML) 0.083% IN NEBU
INHALATION_SOLUTION | RESPIRATORY_TRACT | Status: AC
  Filled 2022-08-25: qty 3

## 2022-08-25 MED ORDER — ALBUTEROL SULFATE (2.5 MG/3ML) 0.083% IN NEBU: 2.5000 mg | INHALATION_SOLUTION | Freq: Four times a day (QID) | RESPIRATORY_TRACT | 5 refills | Status: DC | PRN

## 2022-08-25 MED ORDER — ALBUTEROL SULFATE (2.5 MG/3ML) 0.083% IN NEBU
2.5000 mg | INHALATION_SOLUTION | Freq: Once | RESPIRATORY_TRACT | Status: AC
  Administered 2022-08-25: 2.5 mg via RESPIRATORY_TRACT

## 2022-08-25 MED ORDER — METHYLPREDNISOLONE SODIUM SUCC 125 MG IJ SOLR
60.0000 mg | Freq: Once | INTRAMUSCULAR | Status: AC
  Administered 2022-08-25: 60 mg via INTRAMUSCULAR

## 2022-08-25 NOTE — ED Triage Notes (Signed)
Asthma flare up for 2 days. Using inhaler. Her pulmonologist instructed her to come here.

## 2022-08-25 NOTE — Discharge Instructions (Addendum)
Please use the nebulizer machine 3 times daily (every 6 hours) for the next several days. Then continue as needed.  Prednisone 40 mg daily for the next 5 days (starting tomorrow - Sunday!)  The combination of these medicines plus the steroid injection given today should help to improve symptoms and reduce likelihood of having another exacerbation over the next few months.  Please follow closely with your pulmonologist  Please go to the emergency department if symptoms worsen.

## 2022-08-25 NOTE — ED Provider Notes (Addendum)
[facility]    CSN: [id number] Arrival date & time: 08/25/22  1400     History   Chief Complaint Chief Complaint  Patient presents with   asthma flare up    HPI Janice Blake is a 52 y.o. female.  Here with 2 day history of asthma flareup Short of breath and wheezing. Has tried rescue inhaler without relief.  Most recent exacerbation was end of February. Reports she usually only has them once or twice a year.  Called her pulmonology office yesterday who advised her to come here  Uses Breztri BID, rescue inhaler prn. Does not have neb machine.  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    Nasal polyps    Shortness of breath    "just related to the asthma" (05/05/2013)    Patient Active Problem List   Diagnosis Date Noted   Moderate persistent asthma, uncomplicated 03/23/2021   Chronic rhinitis 03/23/2021   Nasal polyps 03/23/2021   Obesity (BMI 30.0-34.9) 06/13/2020   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   Colon cancer screening declined 04/08/2020   COVID-19 vaccination declined 04/08/2020   Influenza vaccination declined 04/08/2020   Overweight 10/15/2017   Back muscle spasm 01/22/2014   Menorrhagia with regular cycle 11/30/2013   Anemia, iron deficiency 11/30/2013   History of nasal polyp 11/30/2013   Allergic rhinitis 05/05/2013   Aspirin-exacerbated respiratory disease (AERD)     Past Surgical History:  Procedure Laterality Date   FRACTURE SURGERY     NASAL POLYP SURGERY Bilateral 2011   NASAL SINUS SURGERY Bilateral 11/03/2014   Procedure: BILATERAL ENDOSCOPIC POLYPECTOMY ETHMOIDECTOMY MAXILLARY ANTROSTOMY ;  Surgeon: Serena Colonel, MD;  Location: [facility];  Service: ENT;  Laterality: Bilateral;   PATELLA FRACTURE SURGERY Right 1996   "crushed; rod w/12 screws on one side, 6 screws on  the other"   TUBAL LIGATION  1994    OB History   No obstetric history on file.      Home Medications    Prior to Admission medications   Medication Sig Start Date End Date Taking? Authorizing Provider  albuterol (PROVENTIL) (2.5 MG/3ML) 0.083% nebulizer solution Take 3 mLs (2.5 mg total) by nebulization every 6 (six) hours as needed for wheezing or shortness of breath. 08/25/22  Yes Sumner Kirchman, Lurena Joiner, PA-C  albuterol (VENTOLIN HFA) 108 (90 Base) MCG/ACT inhaler Inhale 2 puffs into the lungs every 6 (six) hours as needed for wheezing or shortness of breath. 06/18/22  Yes Nehemiah Settle, FNP  Budeson-Glycopyrrol-Formoterol (BREZTRI AEROSPHERE) 160-9-4.8 MCG/ACT AERO Inhale 2 puffs into the lungs in the morning and at bedtime. 06/18/22  Yes Nehemiah Settle, FNP  cetirizine (ZYRTEC) 10 MG chewable tablet Chew 10 mg by mouth daily.   Yes [provider]  clobetasol ointment (TEMOVATE) 0.05 % Apply 1 application topically 2 (two) times daily. 06/21/21  Yes Alfonse Spruce, MD  Fluticasone Propionate Timmothy Sours) 93 MCG/ACT EXHU Place 1 spray into the nose 2 (two) times daily as needed. 03/23/21  Yes Alfonse Spruce, MD  montelukast (SINGULAIR) 10 MG tablet Take 1 tablet (10 mg total) by mouth at bedtime. 06/18/22  Yes Nehemiah Settle, FNP  predniSONE (DELTASONE) 20 MG tablet Take 2 tablets (40 mg total) by mouth daily with breakfast for 5 days. 08/25/22 08/30/22 Yes Roneka Gilpin,  Lurena Joiner, PA-C    Family History Family History  Problem Relation Age of Onset   Allergic rhinitis Mother    Asthma Father    Hypertension Father    Diabetes Maternal Grandmother    Hypertension Maternal Grandfather    Cancer Paternal Grandmother    Cancer Paternal Grandfather        lung cancer    Asthma Son     Social History Social History   Tobacco Use   Smoking status: Never   Smokeless tobacco: Never  Substance Use Topics   Alcohol use: Yes    Comment: occ.   Drug use: No     Allergies    Amoxicillin and Levaquin [levofloxacin in d5w]   Review of Systems Review of Systems As per HPI  Physical Exam Triage Vital Signs ED Triage Vitals  Enc Vitals Group     BP 08/25/22 1431 122/80     Pulse Rate 08/25/22 1431 86     Resp 08/25/22 1431 (!) 24     Temp 08/25/22 1431 98.3 F (36.8 C)     Temp Source 08/25/22 1431 Oral     SpO2 08/25/22 1431 91 %     Weight 08/25/22 1429 189 lb (85.7 kg)     Height --      Head Circumference --      Peak Flow --      Pain Score 08/25/22 1429 0     Pain Loc --      Pain Edu? --      Excl. in GC? --    No data found.  Updated Vital Signs BP 122/80 (BP Location: Right Arm)   Pulse 86   Temp 98.3 F (36.8 C) (Oral)   Resp (!) 24   Wt 189 lb (85.7 kg)   SpO2 91%   BMI 27.91 kg/m   Physical Exam Vitals and nursing note reviewed.  Constitutional:      General: She is not in acute distress.    Appearance: Normal appearance. She is not ill-appearing.  HENT:     Mouth/Throat:     Mouth: Mucous membranes are moist.     Pharynx: Oropharynx is clear.  Cardiovascular:     Rate and Rhythm: Normal rate and regular rhythm.     Pulses: Normal pulses.     Heart sounds: Normal heart sounds.  Pulmonary:     Effort: Pulmonary effort is normal.     Breath sounds: Wheezing present.     Comments: Insp and exp wheezing throughout all lung fields.  Patient with audible wheeze after speaking short sentences Musculoskeletal:     Cervical back: Normal range of motion.  Skin:    General: Skin is warm and dry.  Neurological:     Mental Status: She is alert and oriented to person, place, and time.     UC Treatments / Results  Labs (all labs ordered are listed, but only abnormal results are displayed) Labs Reviewed - No data to display  EKG  Radiology No results found.  Procedures Procedures   Medications Ordered in UC Medications  albuterol (PROVENTIL) (2.5 MG/3ML) 0.083% nebulizer solution 2.5 mg (2.5 mg Nebulization Given  08/25/22 1503)  methylPREDNISolone sodium succinate (SOLU-MEDROL) 125 mg/2 mL injection 60 mg (60 mg Intramuscular Given 08/25/22 1503)    Initial Impression / Assessment and Plan / UC Course  I have reviewed the triage vital signs and the nursing notes.  Pertinent labs & imaging results that were available during my  care of the patient were reviewed by me and considered in my medical decision making (see chart for details).  Albuterol nebulizer and IM Solu-Medrol given with improvement in symptoms. Patient reports feeling much better. Provided with nebulizer machine to use at home.  Recommend using every 6 hours for the next several days and then continuing as needed.  Prednisone 40 mg for 5 days starting tomorrow.  Will continue Mucinex and increasing fluids.  Patient will monitor for any increasing or worsening of symptoms and proceed to the emergency department if this occurs.  Otherwise we will follow with pulmonology  Final Clinical Impressions(s) / UC Diagnoses   Final diagnoses:  Moderate persistent asthma with acute exacerbation     Discharge Instructions      Please use the nebulizer machine 3 times daily (every 6 hours) for the next several days. Then continue as needed.  Prednisone 40 mg daily for the next 5 days (starting tomorrow - Sunday!)  The combination of these medicines plus the steroid injection given today should help to improve symptoms and reduce likelihood of having another exacerbation over the next few months.  Please follow closely with your pulmonologist  Please go to the emergency department if symptoms worsen.    ED Prescriptions     Medication Sig Dispense Auth. Provider   albuterol (PROVENTIL) (2.5 MG/3ML) 0.083% nebulizer solution Take 3 mLs (2.5 mg total) by nebulization every 6 (six) hours as needed for wheezing or shortness of breath. 75 mL Daxtin Leiker, PA-C   predniSONE (DELTASONE) 20 MG tablet Take 2 tablets (40 mg total) by mouth daily  with breakfast for 5 days. 10 tablet Priya Matsen, Lurena Joiner, PA-C      PDMP not reviewed this encounter.     Marlow Baars, [place] 08/25/22 1811

## 2022-08-30 ENCOUNTER — Telehealth: Payer: Self-pay

## 2022-08-30 NOTE — Telephone Encounter (Signed)
Referral message sent to patient via mychart.

## 2022-09-05 NOTE — Telephone Encounter (Signed)
Letter sent as mychart message wasn't opened.

## 2022-09-06 ENCOUNTER — Ambulatory Visit: Payer: BC Managed Care – PPO | Admitting: Allergy & Immunology

## 2022-09-21 ENCOUNTER — Encounter: Payer: Self-pay | Admitting: Internal Medicine

## 2022-09-25 ENCOUNTER — Other Ambulatory Visit: Payer: Self-pay | Admitting: Internal Medicine

## 2022-09-25 ENCOUNTER — Encounter: Payer: Self-pay | Admitting: Internal Medicine

## 2022-09-25 MED ORDER — ALBUTEROL SULFATE HFA 108 (90 BASE) MCG/ACT IN AERS: 2.0000 | INHALATION_SPRAY | Freq: Four times a day (QID) | RESPIRATORY_TRACT | 0 refills | Status: DC | PRN

## 2022-09-25 MED ORDER — PREDNISONE 20 MG PO TABS
ORAL_TABLET | ORAL | 0 refills | Status: DC
Start: 2022-09-25 — End: 2022-12-23

## 2022-09-25 MED ORDER — MONTELUKAST SODIUM 10 MG PO TABS
10.0000 mg | ORAL_TABLET | Freq: Every day | ORAL | 0 refills | Status: DC
Start: 2022-09-25 — End: 2022-12-28

## 2022-09-25 MED ORDER — CLOBETASOL PROPIONATE 0.05 % EX OINT: 1.0000 | TOPICAL_OINTMENT | Freq: Two times a day (BID) | CUTANEOUS | 0 refills | Status: DC

## 2022-09-25 MED ORDER — XHANCE 93 MCG/ACT NA EXHU: 1.0000 | INHALANT_SUSPENSION | Freq: Two times a day (BID) | NASAL | 0 refills | Status: DC | PRN

## 2022-09-25 MED ORDER — BREZTRI AEROSPHERE 160-9-4.8 MCG/ACT IN AERO: 2.0000 | INHALATION_SPRAY | Freq: Two times a day (BID) | RESPIRATORY_TRACT | 0 refills | Status: DC

## 2022-09-26 ENCOUNTER — Encounter: Payer: Self-pay | Admitting: Nurse Practitioner

## 2022-09-26 ENCOUNTER — Telehealth: Payer: BC Managed Care – PPO | Admitting: Nurse Practitioner

## 2022-09-26 DIAGNOSIS — J4541 Moderate persistent asthma with (acute) exacerbation: Secondary | ICD-10-CM | POA: Diagnosis not present

## 2022-09-26 MED ORDER — PREDNISONE 20 MG PO TABS
20.0000 mg | ORAL_TABLET | Freq: Every day | ORAL | 0 refills | Status: AC
Start: 2022-09-26 — End: 2022-10-03

## 2022-09-26 NOTE — Progress Notes (Signed)
Virtual Visit Consent   Remi Maekawa, you are scheduled for a virtual visit with a [facility] provider today. Just as with appointments in the office, your consent must be obtained to participate. Your consent will be active for this visit and any virtual visit you may have with one of our providers in the next 365 days. If you have a MyChart account, a copy of this consent can be sent to you electronically.  As this is a virtual visit, video technology does not allow for your provider to perform a traditional examination. This may limit your provider's ability to fully assess your condition. If your provider identifies any concerns that need to be evaluated in person or the need to arrange testing (such as labs, EKG, etc.), we will make arrangements to do so. Although advances in technology are sophisticated, we cannot ensure that it will always work on either your end or our end. If the connection with a video visit is poor, the visit may have to be switched to a telephone visit. With either a video or telephone visit, we are not always able to ensure that we have a secure connection.  By engaging in this virtual visit, you consent to the provision of healthcare and authorize for your insurance to be billed (if applicable) for the services provided during this visit. Depending on your insurance coverage, you may receive a charge related to this service.  I need to obtain your verbal consent now. Are you willing to proceed with your visit today? Janice Blake has provided verbal consent on 09/26/2022 for a virtual visit (video or telephone). Viviano Simas, FNP  Date: 09/26/2022 6:28 PM  Virtual Visit via Video Note   I, Viviano Simas, connected with  Janice Blake  ([id number], [birth year]) on 09/26/22 at  6:30 PM EDT by a video-enabled telemedicine application and verified that I am speaking with the correct person using two identifiers.  Location: Patient: Virtual Visit Location Patient:  Home Provider: Virtual Visit Location Provider: Home Office   I discussed the limitations of evaluation and management by telemedicine and the availability of in person appointments. The patient expressed understanding and agreed to proceed.    History of Present Illness: Janice Blake is a 52 y.o. who identifies as a female who was assigned female at birth, and is being seen today for asthma exacerbation.  She has been using both of her inhalers   Budeson-Glycopyrrol-Formoterol (BREZTRI AEROSPHERE) 160-9-4.8 MCG/ACT AERO, Inhale 2 puffs into the lungs 2 (two) times daily & She uses her Albuterol nebulizer every 4-6 hours.   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   Colon cancer screening declined 04/08/2020   COVID-19 vaccination declined 04/08/2020   Influenza vaccination declined 04/08/2020   Overweight 10/15/2017   Back muscle spasm 01/22/2014   Menorrhagia with regular cycle 11/30/2013   Anemia, iron deficiency 11/30/2013   History of nasal polyp 11/30/2013   Allergic  rhinitis 05/05/2013   Aspirin-exacerbated respiratory disease (AERD)     Allergies:  Allergies  Allergen Reactions   Amoxicillin Hives and Itching   Levaquin [Levofloxacin In D5w] Hives and Itching   Medications:  Current Outpatient Medications:    albuterol (PROVENTIL) (2.5 MG/3ML) 0.083% nebulizer solution, Take 3 mLs (2.5 mg total) by nebulization  every 6 (six) hours as needed for wheezing or shortness of breath., Disp: 75 mL, Rfl: 5   albuterol (VENTOLIN HFA) 108 (90 Base) MCG/ACT inhaler, Inhale 2 puffs into the lungs every 6 (six) hours as needed for wheezing or shortness of breath., Disp: 18 g, Rfl: 0   Budeson-Glycopyrrol-Formoterol (BREZTRI AEROSPHERE) 160-9-4.8 MCG/ACT AERO, Inhale 2 puffs into the lungs in the morning and at bedtime., Disp: 5.9 g, Rfl: 45   Budeson-Glycopyrrol-Formoterol (BREZTRI AEROSPHERE) 160-9-4.8 MCG/ACT AERO, Inhale 2 puffs into the lungs 2 (two) times daily., Disp: 10.7 g, Rfl: 0   cetirizine (ZYRTEC) 10 MG chewable tablet, Chew 10 mg by mouth daily., Disp: , Rfl:    clobetasol ointment (TEMOVATE) 0.05 %, Apply 1 Application topically 2 (two) times daily., Disp: 30 g, Rfl: 0   Fluticasone Propionate (XHANCE) 93 MCG/ACT EXHU, Place 1 spray into the nose 2 (two) times daily as needed., Disp: 16 mL, Rfl: 0   montelukast (SINGULAIR) 10 MG tablet, Take 1 tablet (10 mg total) by mouth at bedtime., Disp: 30 tablet, Rfl: 5   montelukast (SINGULAIR) 10 MG tablet, Take 1 tablet (10 mg total) by mouth at bedtime., Disp: 30 tablet, Rfl: 0   predniSONE (DELTASONE) 20 MG tablet, 1 tab PO daily x 3 days then 1/2 tab daily x 3 days, Disp: 5 tablet, Rfl: 0  Observations/Objective: Patient is well-developed, well-nourished in no acute distress.  Resting comfortably  at home.  Head is normocephalic, atraumatic.  No labored breathing.  Speech is clear and coherent with logical content.  Patient is alert and oriented at baseline.    Assessment and Plan: 1. Moderate persistent asthma with exacerbation Follow up with PCP as discussed  Stay indoors as much as possible during high allergen and poor air quality days  Continue inhalers as directed  - predniSONE (DELTASONE) 20 MG tablet; Take 1 tablet (20 mg total) by mouth daily with breakfast for 7 days.  Dispense: 7 tablet; Refill: 0     Follow Up Instructions: I discussed  the assessment and treatment plan with the patient. The patient was provided an opportunity to ask questions and all were answered. The patient agreed with the plan and demonstrated an understanding of the instructions.  A copy of instructions were sent to the patient via MyChart unless otherwise noted below.    The patient was advised to call back or seek an in-person evaluation if the symptoms worsen or if the condition fails to improve as anticipated.  Time:  I spent 11 minutes with the patient via telehealth technology discussing the above problems/concerns.    Viviano Simas, FNP

## 2022-09-27 ENCOUNTER — Encounter: Payer: Self-pay | Admitting: Internal Medicine

## 2022-09-27 ENCOUNTER — Other Ambulatory Visit: Payer: Self-pay

## 2022-10-09 ENCOUNTER — Encounter: Payer: Self-pay | Admitting: Nurse Practitioner

## 2022-10-10 ENCOUNTER — Telehealth: Payer: BC Managed Care – PPO | Admitting: Nurse Practitioner

## 2022-10-10 DIAGNOSIS — J4541 Moderate persistent asthma with (acute) exacerbation: Secondary | ICD-10-CM

## 2022-10-10 MED ORDER — PREDNISONE 20 MG PO TABS
20.0000 mg | ORAL_TABLET | Freq: Every day | ORAL | 0 refills | Status: AC
Start: 2022-10-10 — End: 2022-11-09

## 2022-10-10 NOTE — Progress Notes (Signed)
Virtual Visit Consent   Janice Blake, you are scheduled for a virtual visit with a [facility] provider today. Just as with appointments in the office, your consent must be obtained to participate. Your consent will be active for this visit and any virtual visit you may have with one of our providers in the next 365 days. If you have a MyChart account, a copy of this consent can be sent to you electronically.  As this is a virtual visit, video technology does not allow for your provider to perform a traditional examination. This may limit your provider's ability to fully assess your condition. If your provider identifies any concerns that need to be evaluated in person or the need to arrange testing (such as labs, EKG, etc.), we will make arrangements to do so. Although advances in technology are sophisticated, we cannot ensure that it will always work on either your end or our end. If the connection with a video visit is poor, the visit may have to be switched to a telephone visit. With either a video or telephone visit, we are not always able to ensure that we have a secure connection.  By engaging in this virtual visit, you consent to the provision of healthcare and authorize for your insurance to be billed (if applicable) for the services provided during this visit. Depending on your insurance coverage, you may receive a charge related to this service.  I need to obtain your verbal consent now. Are you willing to proceed with your visit today? Janice Blake has provided verbal consent on 10/10/2022 for a virtual visit (video or telephone). Viviano Simas, FNP  Date: 10/10/2022 11:00 AM  Virtual Visit via Video Note   I, Viviano Simas, connected with  Janice Blake  ([id number], 1970-06-17) on 10/10/22 at 11:00 AM EDT by a video-enabled telemedicine application and verified that I am speaking with the correct person using two identifiers.  Location: Patient: Virtual Visit Location Patient:  Home Provider: Virtual Visit Location Provider: Home Office   I discussed the limitations of evaluation and management by telemedicine and the availability of in person appointments. The patient expressed understanding and agreed to proceed.    History of Present Illness: Janice Blake is a 52 y.o. who identifies as a female who was assigned female at birth, and is being seen today for ongoing unmanaged moderate/persistent asthma.  She noted at her last visit that she was scheduled for a new PCP visit and did not need help with that Today she notes she is not scheduled with anyone an needs help  She has historically been taking prednisone consistently over the past 9 years on average 20mg  daily She was able to come off that when getting Solu-medrol injections in office (2 years) She has been followed by PCP for which she recently was dismissed  She has seen an allergist/pulmonologist in the past as well  Notes from [facility] 2023  She is currently using Albuterol and Breztri inhalers  She continues to take Singulair   One Chest Xray on file from 2015 evidencing hyperexpansion  No record of CT   Notes her asthma started when she worked in a [employer]    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   Colon cancer screening declined 04/08/2020   COVID-19 vaccination declined 04/08/2020  Influenza vaccination declined 04/08/2020   Overweight 10/15/2017   Back muscle spasm 01/22/2014   Menorrhagia with regular cycle 11/30/2013   Anemia, iron deficiency 11/30/2013   History of nasal polyp 11/30/2013   Allergic rhinitis 05/05/2013   Aspirin-exacerbated respiratory disease (AERD)     Allergies:  Allergies  Allergen Reactions   Amoxicillin Hives and Itching   Levaquin  [Levofloxacin In D5w] Hives and Itching   Medications:  Current Outpatient Medications:    albuterol (PROVENTIL) (2.5 MG/3ML) 0.083% nebulizer solution, Take 3 mLs (2.5 mg total) by nebulization every 6 (six) hours as needed for wheezing or shortness of breath., Disp: 75 mL, Rfl: 5   albuterol (VENTOLIN HFA) 108 (90 Base) MCG/ACT inhaler, Inhale 2 puffs into the lungs every 6 (six) hours as needed for wheezing or shortness of breath., Disp: 18 g, Rfl: 0   Budeson-Glycopyrrol-Formoterol (BREZTRI AEROSPHERE) 160-9-4.8 MCG/ACT AERO, Inhale 2 puffs into the lungs in the morning and at bedtime., Disp: 5.9 g, Rfl: 45   Budeson-Glycopyrrol-Formoterol (BREZTRI AEROSPHERE) 160-9-4.8 MCG/ACT AERO, Inhale 2 puffs into the lungs 2 (two) times daily., Disp: 10.7 g, Rfl: 0   cetirizine (ZYRTEC) 10 MG chewable tablet, Chew 10 mg by mouth daily., Disp: , Rfl:    clobetasol ointment (TEMOVATE) 0.05 %, Apply 1 Application topically 2 (two) times daily., Disp: 30 g, Rfl: 0   Fluticasone Propionate (XHANCE) 93 MCG/ACT EXHU, Place 1 spray into the nose 2 (two) times daily as needed., Disp: 16 mL, Rfl: 0   montelukast (SINGULAIR) 10 MG tablet, Take 1 tablet (10 mg total) by mouth at bedtime., Disp: 30 tablet, Rfl: 5   montelukast (SINGULAIR) 10 MG tablet, Take 1 tablet (10 mg total) by mouth at bedtime., Disp: 30 tablet, Rfl: 0   predniSONE (DELTASONE) 20 MG tablet, 1 tab PO daily x 3 days then 1/2 tab daily x 3 days, Disp: 5 tablet, Rfl: 0  Observations/Objective: Patient is well-developed, well-nourished in no acute distress.  Resting comfortably  at home.  Head is normocephalic, atraumatic.  No labored breathing.  Speech is clear and coherent with logical content.  Patient is alert and oriented at baseline.    Assessment and Plan: 1. Moderate persistent asthma with acute exacerbation Meds ordered this encounter  Medications   predniSONE (DELTASONE) 20 MG tablet    Sig: Take 1 tablet (20 mg total) by mouth  daily with breakfast.    Dispense:  30 tablet    Refill:  0    Patient is aware this is the last time Virtual Care can manage her asthma, she will need in person evaluation as this is a chronic condition     Assisted in scheduling with a new PCP July 2nd   Patient is agreeable to plan and will follow up with new PCP   Follow Up Instructions: I discussed the assessment and treatment plan with the patient. The patient was provided an opportunity to ask questions and all were answered. The patient agreed with the plan and demonstrated an understanding of the instructions.  A copy of instructions were sent to the patient via MyChart unless otherwise noted below.    The patient was advised to call back or seek an in-person evaluation if the symptoms worsen or if the condition fails to improve as anticipated.  Time:  I spent 12 minutes with the patient via telehealth technology discussing the above problems/concerns.    Viviano Simas, FNP

## 2022-10-10 NOTE — Patient Instructions (Signed)
New Patient Visit with Janice Blake Tuesday October 23, 2022 Arrive by 9:45 AM EDT Starts at 10:00 AM EDT (20 minutes) Add to calendar [facility] [address] [place] [address] [phone]

## 2022-10-23 ENCOUNTER — Ambulatory Visit: Payer: BC Managed Care – PPO | Admitting: Family Medicine

## 2022-11-06 ENCOUNTER — Ambulatory Visit: Payer: BC Managed Care – PPO | Admitting: Family Medicine

## 2022-11-06 ENCOUNTER — Telehealth: Payer: Self-pay | Admitting: Family Medicine

## 2022-11-06 NOTE — Telephone Encounter (Signed)
New pt no show/ pt blocked for schedule future appt. No letter sent

## 2022-11-19 ENCOUNTER — Ambulatory Visit: Payer: BC Managed Care – PPO | Admitting: Dermatology

## 2022-12-20 ENCOUNTER — Other Ambulatory Visit: Payer: Self-pay | Admitting: Internal Medicine

## 2022-12-23 ENCOUNTER — Telehealth: Payer: Self-pay | Admitting: Nurse Practitioner

## 2022-12-23 DIAGNOSIS — J4541 Moderate persistent asthma with (acute) exacerbation: Secondary | ICD-10-CM

## 2022-12-23 MED ORDER — PREDNISONE 20 MG PO TABS
20.0000 mg | ORAL_TABLET | Freq: Every day | ORAL | 0 refills | Status: DC
Start: 2022-12-23 — End: 2022-12-28

## 2022-12-23 NOTE — Patient Instructions (Signed)
Janice Blake, thank you for joining Claiborne Rigg, NP for today's virtual visit.  While this provider is not your primary care provider (PCP), if your PCP is located in our provider database this encounter information will be shared with them immediately following your visit.   A [facility] MyChart account gives you access to today's visit and all your visits, tests, and labs performed at [facility] " click here if you don't have a [facility] MyChart account or go to [email or web address]  Consent: (Patient) Janice Blake provided verbal consent for this virtual visit at the beginning of the encounter.  Current Medications:  Current Outpatient Medications:    predniSONE (DELTASONE) 20 MG tablet, Take 1 tablet (20 mg total) by mouth daily with breakfast for 5 days., Disp: 5 tablet, Rfl: 0   albuterol (PROVENTIL) (2.5 MG/3ML) 0.083% nebulizer solution, Take 3 mLs (2.5 mg total) by nebulization every 6 (six) hours as needed for wheezing or shortness of breath., Disp: 75 mL, Rfl: 5   albuterol (VENTOLIN HFA) 108 (90 Base) MCG/ACT inhaler, Inhale 2 puffs into the lungs every 6 (six) hours as needed for wheezing or shortness of breath., Disp: 18 g, Rfl: 0   Budeson-Glycopyrrol-Formoterol (BREZTRI AEROSPHERE) 160-9-4.8 MCG/ACT AERO, Inhale 2 puffs into the lungs in the morning and at bedtime., Disp: 5.9 g, Rfl: 45   Budeson-Glycopyrrol-Formoterol (BREZTRI AEROSPHERE) 160-9-4.8 MCG/ACT AERO, Inhale 2 puffs into the lungs 2 (two) times daily., Disp: 10.7 g, Rfl: 0   cetirizine (ZYRTEC) 10 MG chewable tablet, Chew 10 mg by mouth daily., Disp: , Rfl:    clobetasol ointment (TEMOVATE) 0.05 %, Apply 1 Application topically 2 (two) times daily., Disp: 30 g, Rfl: 0   Fluticasone Propionate (XHANCE) 93 MCG/ACT EXHU, Place 1 spray into the nose 2 (two) times daily as needed., Disp: 16 mL, Rfl: 0   montelukast (SINGULAIR) 10 MG tablet, Take 1 tablet (10 mg total) by mouth at bedtime.,  Disp: 30 tablet, Rfl: 5   montelukast (SINGULAIR) 10 MG tablet, Take 1 tablet (10 mg total) by mouth at bedtime., Disp: 30 tablet, Rfl: 0   Medications ordered in this encounter:  Meds ordered this encounter  Medications   predniSONE (DELTASONE) 20 MG tablet    Sig: Take 1 tablet (20 mg total) by mouth daily with breakfast for 5 days.    Dispense:  5 tablet    Refill:  0    Order Specific Question:   Supervising Provider    Answer:   Merrilee Jansky [id number]     *If you need refills on other medications prior to your next appointment, please contact your pharmacy*  Follow-Up: Call back or seek an in-person evaluation if the symptoms worsen or if the condition fails to improve as anticipated.  [facility] [phone]  Other Instructions Needs to call [phone] to get established with another community care clinic such as [facility], [facility], [facility] or [facility]. She verbalized understanding. She has been dismissed from [facility].    If you have been instructed to have an in-person evaluation today at a local Urgent Care facility, please use the link below. It will take you to a list of all of our available [facility], including address, phone number and hours of operation. Please do not delay care.  [facility]  If you or a family member do not have a primary care provider, use the link below to  schedule a visit and establish care. When you choose a [facility] primary care physician or advanced practice provider, you gain a long-term partner in health. Find a Primary Care Provider  Learn more about [facility]'s in-office and virtual care options:  - Get Care Now

## 2022-12-23 NOTE — Progress Notes (Signed)
Virtual Visit Consent   Janice Blake, you are scheduled for a virtual visit with a [facility] provider today. Just as with appointments in the office, your consent must be obtained to participate. Your consent will be active for this visit and any virtual visit you may have with one of our providers in the next 365 days. If you have a MyChart account, a copy of this consent can be sent to you electronically.  As this is a virtual visit, video technology does not allow for your provider to perform a traditional examination. This may limit your provider's ability to fully assess your condition. If your provider identifies any concerns that need to be evaluated in person or the need to arrange testing (such as labs, EKG, etc.), we will make arrangements to do so. Although advances in technology are sophisticated, we cannot ensure that it will always work on either your end or our end. If the connection with a video visit is poor, the visit may have to be switched to a telephone visit. With either a video or telephone visit, we are not always able to ensure that we have a secure connection.  By engaging in this virtual visit, you consent to the provision of healthcare and authorize for your insurance to be billed (if applicable) for the services provided during this visit. Depending on your insurance coverage, you may receive a charge related to this service.  I need to obtain your verbal consent now. Are you willing to proceed with your visit today? [name] has provided verbal consent on 12/23/2022 for a virtual visit (video or telephone). Claiborne Rigg, NP  Date: 12/23/2022 2:50 PM  Virtual Visit via Video Note   I, Claiborne Rigg, connected with  Janice Blake  ([id number], 1971/01/20) on 12/23/22 at  2:15 PM EDT by a video-enabled telemedicine application and verified that I am speaking with the correct person using two identifiers.  Location: Patient: Virtual Visit Location Patient:  Home Provider: Virtual Visit Location Provider: Home Office   I discussed the limitations of evaluation and management by telemedicine and the availability of in person appointments. The patient expressed understanding and agreed to proceed.    History of Present Illness: Isebella Blake is a 52 y.o. who identifies as a female who was assigned female at birth, and is being seen today for asthma exacerbation.  Ms. Weiland states she lost her job in July and was not able to establish care with a new PCP. This appointment was made for her a few months ago after she had a video visit requesting daily prednisone be sent to her pharmacy. At that time after 2 back to back visits of requesting monthly steroids she was instructed that she would need to establish with a PCP for additional refills. Today she reports she was not able to do so due to lack of insurance and is again requesting refill of daily steroids.   She also can not afford her breztri or albuterol inhaler.    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   Colon cancer screening declined 04/08/2020   COVID-19 vaccination declined 04/08/2020   Influenza vaccination declined 04/08/2020   Overweight 10/15/2017   Back muscle spasm 01/22/2014   Menorrhagia with regular cycle 11/30/2013   Anemia, iron deficiency 11/30/2013  History of nasal polyp 11/30/2013   Allergic rhinitis 05/05/2013   Aspirin-exacerbated respiratory disease (AERD)     Allergies:  Allergies  Allergen Reactions   Amoxicillin Hives and Itching   Levaquin [Levofloxacin In D5w] Hives and Itching   Medications:  Current Outpatient Medications:    predniSONE (DELTASONE) 20 MG tablet, Take 1 tablet (20 mg total) by mouth daily with breakfast for 5 days., Disp: 5 tablet,  Rfl: 0   albuterol (PROVENTIL) (2.5 MG/3ML) 0.083% nebulizer solution, Take 3 mLs (2.5 mg total) by nebulization every 6 (six) hours as needed for wheezing or shortness of breath., Disp: 75 mL, Rfl: 5   albuterol (VENTOLIN HFA) 108 (90 Base) MCG/ACT inhaler, Inhale 2 puffs into the lungs every 6 (six) hours as needed for wheezing or shortness of breath., Disp: 18 g, Rfl: 0   Budeson-Glycopyrrol-Formoterol (BREZTRI AEROSPHERE) 160-9-4.8 MCG/ACT AERO, Inhale 2 puffs into the lungs in the morning and at bedtime., Disp: 5.9 g, Rfl: 45   Budeson-Glycopyrrol-Formoterol (BREZTRI AEROSPHERE) 160-9-4.8 MCG/ACT AERO, Inhale 2 puffs into the lungs 2 (two) times daily., Disp: 10.7 g, Rfl: 0   cetirizine (ZYRTEC) 10 MG chewable tablet, Chew 10 mg by mouth daily., Disp: , Rfl:    clobetasol ointment (TEMOVATE) 0.05 %, Apply 1 Application topically 2 (two) times daily., Disp: 30 g, Rfl: 0   Fluticasone Propionate (XHANCE) 93 MCG/ACT EXHU, Place 1 spray into the nose 2 (two) times daily as needed., Disp: 16 mL, Rfl: 0   montelukast (SINGULAIR) 10 MG tablet, Take 1 tablet (10 mg total) by mouth at bedtime., Disp: 30 tablet, Rfl: 5   montelukast (SINGULAIR) 10 MG tablet, Take 1 tablet (10 mg total) by mouth at bedtime., Disp: 30 tablet, Rfl: 0  Observations/Objective: Patient is well-developed, well-nourished in no acute distress.  Resting comfortably at home.  Head is normocephalic, atraumatic.  No labored breathing.  Speech is clear and coherent with logical content.  Patient is alert and oriented at baseline.    Assessment and Plan: 1. Moderate persistent asthma with acute exacerbation - predniSONE (DELTASONE) 20 MG tablet; Take 1 tablet (20 mg total) by mouth daily with breakfast for 5 days.  Dispense: 5 tablet; Refill: 0 Needs to call [phone] to get established with another community care clinic such as [facility], [facility], [facility] or [facility].  She verbalized understanding. She has been dismissed from [facility].   Follow Up Instructions: I discussed the assessment and treatment plan with the patient. The patient was provided an opportunity to ask questions and all were answered. The patient agreed with the plan and demonstrated an understanding of the instructions.  A copy of instructions were sent to the patient via MyChart unless otherwise noted below.    The patient was advised to call back or seek an in-person evaluation if the symptoms worsen or if the condition fails to improve as anticipated.  Time:  I spent 12 minutes with the patient via telehealth technology discussing the above problems/concerns.    Claiborne Rigg, NP

## 2022-12-25 ENCOUNTER — Ambulatory Visit: Payer: Self-pay

## 2022-12-25 NOTE — Telephone Encounter (Signed)
  Chief Complaint: difficulty breathing Symptoms: above - wheezing Frequency: Running out of inhaler Pertinent Negatives: Patient denies  Disposition: [x] ED /[] Urgent Care (no appt availability in office) / [] Appointment(In office/virtual)/ []  [place] [facility]/ [] Home Care/ [] Refused Recommended Disposition /[] [place] [facility]/ []  Follow-up with PCP Additional Notes: Pt is out of inhaler medication. Pt is unable to refill d/t cost. Pt needs to establish as a new pt with another provider. Pt will go to ED for care.    Reason for Disposition  [1] MODERATE difficulty breathing (e.g., speaks in phrases, SOB even at rest, pulse 100-120) AND [2] NEW-onset or WORSE than normal  Answer Assessment - Initial Assessment Questions 1. RESPIRATORY STATUS: "Describe your breathing?" (e.g., wheezing, shortness of breath, unable to speak, severe coughing)      Sunday - Monday of last week 2. ONSET: "When did this breathing problem begin?"      Last week 3. PATTERN "Does the difficult breathing come and go, or has it been constant since it started?"      constant 4. SEVERITY: "How bad is your breathing?" (e.g., mild, moderate, severe)    - MILD: No SOB at rest, mild SOB with walking, speaks normally in sentences, can lie down, no retractions, pulse < 100.    - MODERATE: SOB at rest, SOB with minimal exertion and prefers to sit, cannot lie down flat, speaks in phrases, mild retractions, audible wheezing, pulse 100-120.    - SEVERE: Very SOB at rest, speaks in single words, struggling to breathe, sitting hunched forward, retractions, pulse > 120      moderate 5. RECURRENT SYMPTOM: "Have you had difficulty breathing before?" If Yes, ask: "When was the last time?" and "What happened that time?"      Yes - ongoing 7. LUNG HISTORY: "Do you have any history of lung disease?"  (e.g., pulmonary embolus, asthma, emphysema)     yes 8. CAUSE: "What do you think is causing the breathing problem?"      No  inhaler  Protocols used: Breathing Difficulty-A-AH

## 2022-12-27 ENCOUNTER — Telehealth: Payer: Self-pay

## 2022-12-27 NOTE — Telephone Encounter (Signed)
Spoke with patient. Patient wanted to appointment with appointment with one of the offices provider. Advised patient that a letter was sent out to her on June 4th, 2024 stating that her prior PCP would not longer be able to provide medical care for her. Patient voiced that she thought she would be able to see another provider with the office . Advised that I had spoken to our medical director and it has been advised that she should obtain primary medical care with another practice.  Patient voiced that she could not understand this and this has happened just because she did not like the provider. Voiced that she has been coming to this office for a long time.  Suggested to patient that there are other primary practices in the area.  Advised that a 30 day supply of her prescription have been sent to her pharmacy. Advised that no other prescriptions will be refilled as she is no longer a patient at this office and it is important that she establish care at another office as soon as possible. Patient was very cordial and said that she was going to do that. I wished her the best and the call ended.

## 2022-12-28 ENCOUNTER — Emergency Department (HOSPITAL_COMMUNITY): Payer: Self-pay

## 2022-12-28 ENCOUNTER — Encounter (HOSPITAL_COMMUNITY): Payer: Self-pay

## 2022-12-28 ENCOUNTER — Emergency Department (HOSPITAL_COMMUNITY)
Admission: EM | Admit: 2022-12-28 | Discharge: 2022-12-28 | Disposition: A | Discharge status: Home / Self Care | Payer: Self-pay | Attending: Emergency Medicine | Admitting: Emergency Medicine

## 2022-12-28 ENCOUNTER — Other Ambulatory Visit: Payer: Self-pay

## 2022-12-28 DIAGNOSIS — Z760 Encounter for issue of repeat prescription: Secondary | ICD-10-CM | POA: Insufficient documentation

## 2022-12-28 DIAGNOSIS — Z7951 Long term (current) use of inhaled steroids: Secondary | ICD-10-CM | POA: Insufficient documentation

## 2022-12-28 DIAGNOSIS — J4541 Moderate persistent asthma with (acute) exacerbation: Secondary | ICD-10-CM | POA: Insufficient documentation

## 2022-12-28 DIAGNOSIS — Z20822 Contact with and (suspected) exposure to covid-19: Secondary | ICD-10-CM | POA: Insufficient documentation

## 2022-12-28 LAB — CBC
HCT: 38.8 % (ref 36.0–46.0)
Hemoglobin: 12.2 g/dL (ref 12.0–15.0)
MCH: 28.8 pg (ref 26.0–34.0)
MCHC: 31.4 g/dL (ref 30.0–36.0)
MCV: 91.7 fL (ref 80.0–100.0)
Platelets: 388 10*3/uL (ref 150–400)
RBC: 4.23 MIL/uL (ref 3.87–5.11)
RDW: 16.6 % — ABNORMAL HIGH (ref 11.5–15.5)
WBC: 7.5 10*3/uL (ref 4.0–10.5)
nRBC: 0 % (ref 0.0–0.2)

## 2022-12-28 LAB — RESP PANEL BY RT-PCR (RSV, FLU A&B, COVID)  RVPGX2
Influenza A by PCR: NEGATIVE
Influenza B by PCR: NEGATIVE
Resp Syncytial Virus by PCR: NEGATIVE
SARS Coronavirus 2 by RT PCR: NEGATIVE

## 2022-12-28 LAB — COMPREHENSIVE METABOLIC PANEL
ALT: 15 U/L (ref 0–44)
AST: 16 U/L (ref 15–41)
Albumin: 3.5 g/dL (ref 3.5–5.0)
Alkaline Phosphatase: 93 U/L (ref 38–126)
Anion gap: 10 (ref 5–15)
BUN: 14 mg/dL (ref 6–20)
CO2: 23 mmol/L (ref 22–32)
Calcium: 8.8 mg/dL — ABNORMAL LOW (ref 8.9–10.3)
Chloride: 102 mmol/L (ref 98–111)
Creatinine, Ser: 0.95 mg/dL (ref 0.44–1.00)
GFR, Estimated: >60 mL/min (ref >60)
Glucose, Bld: 93 mg/dL (ref 70–99)
Potassium: 4.2 mmol/L (ref 3.5–5.1)
Sodium: 135 mmol/L (ref 135–145)
Total Bilirubin: 0.5 mg/dL (ref 0.3–1.2)
Total Protein: 7.8 g/dL (ref 6.5–8.1)

## 2022-12-28 LAB — HCG, SERUM, QUALITATIVE: Preg, Serum: NEGATIVE

## 2022-12-28 MED ORDER — ALBUTEROL SULFATE HFA 108 (90 BASE) MCG/ACT IN AERS
2.0000 | INHALATION_SPRAY | RESPIRATORY_TRACT | Status: DC | PRN
  Administered 2022-12-28 (×2): 2 via RESPIRATORY_TRACT
  Filled 2022-12-28 (×2): qty 6.7

## 2022-12-28 MED ORDER — ALBUTEROL SULFATE (2.5 MG/3ML) 0.083% IN NEBU: 2.5000 mg | INHALATION_SOLUTION | Freq: Four times a day (QID) | RESPIRATORY_TRACT | 12 refills | Status: DC | PRN

## 2022-12-28 MED ORDER — PREDNISONE 20 MG PO TABS
20.0000 mg | ORAL_TABLET | Freq: Every day | ORAL | 0 refills | Status: DC
Start: 2022-12-28 — End: 2023-01-12

## 2022-12-28 MED ORDER — MONTELUKAST SODIUM 10 MG PO TABS: 10.0000 mg | ORAL_TABLET | Freq: Every day | ORAL | 5 refills | Status: DC

## 2022-12-28 MED ORDER — IPRATROPIUM-ALBUTEROL 0.5-2.5 (3) MG/3ML IN SOLN
3.0000 mL | Freq: Once | RESPIRATORY_TRACT | Status: AC
  Administered 2022-12-28: 3 mL via RESPIRATORY_TRACT
  Filled 2022-12-28: qty 3

## 2022-12-28 MED ORDER — ALBUTEROL SULFATE (2.5 MG/3ML) 0.083% IN NEBU
10.0000 mg | INHALATION_SOLUTION | Freq: Once | RESPIRATORY_TRACT | Status: AC
  Administered 2022-12-28: 10 mg via RESPIRATORY_TRACT
  Filled 2022-12-28: qty 12

## 2022-12-28 MED ORDER — METHYLPREDNISOLONE SODIUM SUCC 125 MG IJ SOLR
125.0000 mg | Freq: Once | INTRAMUSCULAR | Status: AC
  Administered 2022-12-28: 125 mg via INTRAVENOUS
  Filled 2022-12-28: qty 2

## 2022-12-28 NOTE — ED Provider Notes (Signed)
[place] EMERGENCY DEPARTMENT AT [facility] Provider Note   CSN: [id number] Arrival date & time: 12/28/22  1459     History  No chief complaint on file.   Janice Blake is a 52 y.o. female.  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  She reports that she is currently between jobs, she lost her job in July and lost her insurance at that time as well.  She does no longer have a rescue inhaler and has noticed several days of worsening shortness of breath.  She has increasing wheezing, she is dyspneic on exertion, she denies swelling, cough, fever.  She states this happens when she is stressed, it happens when she is around a lot of smoke and states that her sister smokes in the house.  She also reports that this happens during changes of seasons.  Review of the medical record shows that the patient had been seen in May 2024 for asthma, she had a video visit with family medicine in June for ongoing asthma with refill sent in.  She was then seen September 1, she was requesting a refill of her daily steroids, she was given prednisone for 5 days but reported that she could not afford her inhaler.  Home Medications Prior to Admission medications   Medication Sig Start Date End Date Taking? Authorizing Provider  albuterol (PROVENTIL) (2.5 MG/3ML) 0.083% nebulizer solution Take 3 mLs (2.5 mg total) by nebulization every 6 (six) hours as needed for wheezing or shortness of breath. 08/25/22   Rising, Lurena Joiner, PA-C  albuterol (VENTOLIN HFA) 108 (90 Base) MCG/ACT inhaler Inhale 2 puffs into the lungs every 6 (six) hours as needed for wheezing or shortness of breath. 09/25/22   Marcine Matar, MD  Budeson-Glycopyrrol-Formoterol (BREZTRI AEROSPHERE) 160-9-4.8 MCG/ACT AERO Inhale 2 puffs into the lungs in the morning and at bedtime. 06/18/22   Nehemiah Settle, FNP   Budeson-Glycopyrrol-Formoterol (BREZTRI AEROSPHERE) 160-9-4.8 MCG/ACT AERO Inhale 2 puffs into the lungs 2 (two) times daily. 09/25/22   Marcine Matar, MD  cetirizine (ZYRTEC) 10 MG chewable tablet Chew 10 mg by mouth daily.    [provider]  clobetasol ointment (TEMOVATE) 0.05 % Apply 1 Application topically 2 (two) times daily. 09/25/22   Marcine Matar, MD  Fluticasone Propionate Timmothy Sours) 93 MCG/ACT EXHU Place 1 spray into the nose 2 (two) times daily as needed. 09/25/22   Marcine Matar, MD  montelukast (SINGULAIR) 10 MG tablet Take 1 tablet (10 mg total) by mouth at bedtime. 12/28/22   Eber Hong, MD  predniSONE (DELTASONE) 20 MG tablet Take 1 tablet (20 mg total) by mouth daily with breakfast for 5 days. 12/28/22 01/02/23  Eber Hong, MD      Allergies    Amoxicillin and Levaquin [levofloxacin in d5w]    Review of Systems   Review of Systems  All other systems reviewed and are negative.   Physical Exam Updated Vital Signs BP (!) 150/88   Pulse 72   Temp 98 F (36.7 C) (Oral)   Resp (!) 21   Ht 1.753 m (5\' 9" )   Wt 85.7 kg   SpO2 100%   BMI 27.90 kg/m  Physical Exam Vitals and nursing note reviewed.  Constitutional:      General: She is not in acute distress.    Appearance: She is well-developed.  HENT:     Head: Normocephalic and atraumatic.     Mouth/Throat:     Pharynx: No oropharyngeal exudate.  Eyes:     General: No scleral icterus.       Right eye: No discharge.        Left eye: No discharge.     Conjunctiva/sclera: Conjunctivae normal.     Pupils: Pupils are equal, round, and reactive to light.  Neck:     Thyroid: No thyromegaly.     Vascular: No JVD.  Cardiovascular:     Rate and Rhythm: Normal rate and regular rhythm.     Heart sounds: Normal heart sounds. No murmur heard.    No friction rub. No gallop.  Pulmonary:     Effort: Pulmonary effort is normal. No respiratory distress.     Breath sounds: Wheezing present. No rales.   Abdominal:     General: Bowel sounds are normal. There is no distension.     Palpations: Abdomen is soft. There is no mass.     Tenderness: There is no abdominal tenderness.  Musculoskeletal:        General: No tenderness. Normal range of motion.     Cervical back: Normal range of motion and neck supple.  Lymphadenopathy:     Cervical: No cervical adenopathy.  Skin:    General: Skin is warm and dry.     Findings: No erythema or rash.  Neurological:     Mental Status: She is alert.     Coordination: Coordination normal.  Psychiatric:        Behavior: Behavior normal.     ED Results / Procedures / Treatments   Labs (all labs ordered are listed, but only abnormal results are displayed) Labs Reviewed  COMPREHENSIVE METABOLIC PANEL - Abnormal; Notable for the following components:      Result Value   Calcium 8.8 (*)    All other components within normal limits  CBC - Abnormal; Notable for the following components:   RDW 16.6 (*)    All other components within normal limits  RESP PANEL BY RT-PCR (RSV, FLU A&B, COVID)  RVPGX2  HCG, SERUM, QUALITATIVE    EKG None  Radiology DG Chest 1 View  Result Date: 12/28/2022 CLINICAL DATA:  Shortness of breath EXAM: CHEST  1 VIEW COMPARISON:  X-ray 05/05/2013 FINDINGS: The heart size and mediastinal contours are within normal limits. No consolidation, pneumothorax or effusion. No edema. The visualized skeletal structures are unremarkable. IMPRESSION: No acute cardiopulmonary disease. Electronically Signed   By: Karen Kays M.D.   On: 12/28/2022 16:48    Procedures Procedures    Medications Ordered in ED Medications  albuterol (VENTOLIN HFA) 108 (90 Base) MCG/ACT inhaler 2 puff (2 puffs Inhalation Given 12/28/22 1840)  ipratropium-albuterol (DUONEB) 0.5-2.5 (3) MG/3ML nebulizer solution 3 mL (3 mLs Nebulization Given 12/28/22 1839)  albuterol (PROVENTIL) (2.5 MG/3ML) 0.083% nebulizer solution 10 mg (10 mg Nebulization Given 12/28/22 1839)   methylPREDNISolone sodium succinate (SOLU-MEDROL) 125 mg/2 mL injection 125 mg (125 mg Intravenous Given 12/28/22 1840)    ED Course/ Medical Decision Making/ A&P                                 Medical Decision Making Amount and/or Complexity of Data Reviewed Labs: ordered. Radiology: ordered.  Risk Prescription drug management.    This patient presents to the ED for concern of shortness of breath and wheezing differential  diagnosis includes persistent asthma, pneumothorax, COVID, infection    Additional history obtained:  Additional history obtained from medical record External records from outside source obtained and reviewed including multiple prior visits including family doctor and telehealth   Lab Tests:  I Ordered, and personally interpreted labs.  The pertinent results include: CBC metabolic panel and COVID, all unremarkable   Imaging Studies ordered:  I ordered imaging studies including chest x-ray I independently visualized and interpreted imaging which showed no acute infiltrate or pneumothorax I agree with the radiologist interpretation   Medicines ordered and prescription drug management:  I ordered medication including Alb are all continuous treatment as well as a DuoNeb and steroids.  She was given an inhaler for home for an improved significantly Reevaluation of the patient after these medicines showed that the patient improved I have reviewed the patients home medicines and have made adjustments as needed   Problem List / ED Course:  Asthma exacerbation, needs bronchodilators, she has had steroids but no bronchodilators, given albuterol inhaler Improved significantly after medicines, still has some wheezing but no hypoxia no tachypnea and feels better, stable for discharge   I have discussed with the patient at the bedside the results, and the meaning of these results.  They have expressed her understanding to the need for follow-up with primary  care physician   Social Determinants of Health:  States she will have insurance again, January          Final Clinical Impression(s) / ED Diagnoses Final diagnoses:  Moderate persistent asthma with exacerbation    Rx / DC Orders ED Discharge Orders          Ordered    montelukast (SINGULAIR) 10 MG tablet  Daily at bedtime        12/28/22 2052    predniSONE (DELTASONE) 20 MG tablet  Daily with breakfast        12/28/22 2052              Eber Hong, MD 12/28/22 2053

## 2022-12-28 NOTE — Discharge Instructions (Signed)
Use the inhaler every 4 hours as needed, prednisone once a day, Singulair or montelukast once a day, ER for worsening symptoms, see the list above for family doctor follow-up  Albuterol is an inhaled medication which can help you to breathe better, you should take 2 puffs every 4 hours as needed, this may cause your heart to feel like it is racing, this should be a temporary side effect.   Prednisone is a steroid that helps to reduce certain types of inflammation and may be used for allergic reactions, some rashes such as poison ivy or dermatitis, for asthma attacks or bronchitis and for certain types of pain.  Please take this medicine exactly as prescribed - 40mg  by mouth daily for 5 days.  This can have certain side effects with some people including feeling like you can't sleep, feeling anxious or feeling like you are on a "high".  It should not cause weight gain if only taken for a short time.  Please be aware that this medication may also cause an elevation in your blood sugar if you are a diabetic so if you are a diabetic you will need to keep a very close eye on your blood sugar, make sure that you are eating an extremely low level of carbohydrates and taking your medications exactly as prescribed.  If you should develop severe high blood sugar or start to feel poorly return to the emergency department immediately    Thank you for allowing [place] to treat you in the emergency department today.  After reviewing your examination and potential testing that was done it appears that you are safe to go home.  I would like for you to follow-up with your doctor within the next several days, have them obtain your results and follow-up with them to review all of these tests.  If you should develop severe or worsening symptoms return to the emergency department immediately

## 2022-12-28 NOTE — ED Triage Notes (Signed)
Pt c/o asthma flare up. Pt has audible wheezes. Pt c/o SOBx5d. Pt states she doesn't have an inhaler due to no insurance.

## 2022-12-31 ENCOUNTER — Other Ambulatory Visit (INDEPENDENT_AMBULATORY_CARE_PROVIDER_SITE_OTHER): Payer: Self-pay | Admitting: Emergency Medicine

## 2022-12-31 DIAGNOSIS — J4541 Moderate persistent asthma with (acute) exacerbation: Secondary | ICD-10-CM

## 2023-01-02 ENCOUNTER — Other Ambulatory Visit (INDEPENDENT_AMBULATORY_CARE_PROVIDER_SITE_OTHER): Payer: Self-pay | Admitting: Emergency Medicine

## 2023-01-02 ENCOUNTER — Inpatient Hospital Stay (HOSPITAL_COMMUNITY)
Admission: EM | Admit: 2023-01-02 | Discharge: 2023-01-12 | DRG: 203 | Disposition: A | Discharge status: Home / Self Care | Payer: Medicaid Other | Attending: Internal Medicine | Admitting: Internal Medicine

## 2023-01-02 ENCOUNTER — Encounter (HOSPITAL_COMMUNITY): Payer: Self-pay

## 2023-01-02 ENCOUNTER — Other Ambulatory Visit: Payer: Self-pay

## 2023-01-02 DIAGNOSIS — J4489 Other specified chronic obstructive pulmonary disease: Secondary | ICD-10-CM | POA: Diagnosis present

## 2023-01-02 DIAGNOSIS — Z833 Family history of diabetes mellitus: Secondary | ICD-10-CM

## 2023-01-02 DIAGNOSIS — J339 Nasal polyp, unspecified: Secondary | ICD-10-CM | POA: Diagnosis present

## 2023-01-02 DIAGNOSIS — Z801 Family history of malignant neoplasm of trachea, bronchus and lung: Secondary | ICD-10-CM

## 2023-01-02 DIAGNOSIS — Z79899 Other long term (current) drug therapy: Secondary | ICD-10-CM

## 2023-01-02 DIAGNOSIS — Z880 Allergy status to penicillin: Secondary | ICD-10-CM

## 2023-01-02 DIAGNOSIS — L281 Prurigo nodularis: Secondary | ICD-10-CM | POA: Diagnosis present

## 2023-01-02 DIAGNOSIS — Z825 Family history of asthma and other chronic lower respiratory diseases: Secondary | ICD-10-CM

## 2023-01-02 DIAGNOSIS — Z8249 Family history of ischemic heart disease and other diseases of the circulatory system: Secondary | ICD-10-CM

## 2023-01-02 DIAGNOSIS — J310 Chronic rhinitis: Secondary | ICD-10-CM | POA: Diagnosis present

## 2023-01-02 DIAGNOSIS — Z7951 Long term (current) use of inhaled steroids: Secondary | ICD-10-CM

## 2023-01-02 DIAGNOSIS — J45901 Unspecified asthma with (acute) exacerbation: Secondary | ICD-10-CM | POA: Diagnosis present

## 2023-01-02 DIAGNOSIS — K5900 Constipation, unspecified: Secondary | ICD-10-CM | POA: Diagnosis present

## 2023-01-02 DIAGNOSIS — J4551 Severe persistent asthma with (acute) exacerbation: Principal | ICD-10-CM | POA: Diagnosis present

## 2023-01-02 DIAGNOSIS — R0902 Hypoxemia: Secondary | ICD-10-CM | POA: Diagnosis present

## 2023-01-02 DIAGNOSIS — Z597 Insufficient social insurance and welfare support: Secondary | ICD-10-CM

## 2023-01-02 DIAGNOSIS — D75839 Thrombocytosis, unspecified: Secondary | ICD-10-CM | POA: Diagnosis present

## 2023-01-02 DIAGNOSIS — Z560 Unemployment, unspecified: Secondary | ICD-10-CM

## 2023-01-02 DIAGNOSIS — Z881 Allergy status to other antibiotic agents status: Secondary | ICD-10-CM

## 2023-01-02 DIAGNOSIS — J4541 Moderate persistent asthma with (acute) exacerbation: Secondary | ICD-10-CM

## 2023-01-02 DIAGNOSIS — Z1152 Encounter for screening for COVID-19: Secondary | ICD-10-CM

## 2023-01-02 LAB — CBC WITH DIFFERENTIAL/PLATELET
Abs Immature Granulocytes: 0.02 10*3/uL (ref 0.00–0.07)
Basophils Absolute: 0.1 10*3/uL (ref 0.0–0.1)
Basophils Relative: 1 %
Eosinophils Absolute: 0.9 10*3/uL — ABNORMAL HIGH (ref 0.0–0.5)
Eosinophils Relative: 8 %
HCT: 40.6 % (ref 36.0–46.0)
Hemoglobin: 12.5 g/dL (ref 12.0–15.0)
Immature Granulocytes: 0 %
Lymphocytes Relative: 28 %
Lymphs Abs: 2.8 10*3/uL (ref 0.7–4.0)
MCH: 27.5 pg (ref 26.0–34.0)
MCHC: 30.8 g/dL (ref 30.0–36.0)
MCV: 89.4 fL (ref 80.0–100.0)
Monocytes Absolute: 0.8 10*3/uL (ref 0.1–1.0)
Monocytes Relative: 8 %
Neutro Abs: 5.6 10*3/uL (ref 1.7–7.7)
Neutrophils Relative %: 55 %
Platelets: 422 10*3/uL — ABNORMAL HIGH (ref 150–400)
RBC: 4.54 MIL/uL (ref 3.87–5.11)
RDW: 15.9 % — ABNORMAL HIGH (ref 11.5–15.5)
WBC: 10.2 10*3/uL (ref 4.0–10.5)
nRBC: 0 % (ref 0.0–0.2)

## 2023-01-02 LAB — BASIC METABOLIC PANEL
Anion gap: 8 (ref 5–15)
BUN: 14 mg/dL (ref 6–20)
CO2: 26 mmol/L (ref 22–32)
Calcium: 8.7 mg/dL — ABNORMAL LOW (ref 8.9–10.3)
Chloride: 102 mmol/L (ref 98–111)
Creatinine, Ser: 0.98 mg/dL (ref 0.44–1.00)
GFR, Estimated: >60 mL/min (ref >60)
Glucose, Bld: 85 mg/dL (ref 70–99)
Potassium: 3.5 mmol/L (ref 3.5–5.1)
Sodium: 136 mmol/L (ref 135–145)

## 2023-01-02 LAB — CBC
HCT: 39.1 % (ref 36.0–46.0)
Hemoglobin: 12.4 g/dL (ref 12.0–15.0)
MCH: 27.6 pg (ref 26.0–34.0)
MCHC: 31.7 g/dL (ref 30.0–36.0)
MCV: 86.9 fL (ref 80.0–100.0)
Platelets: 409 10*3/uL — ABNORMAL HIGH (ref 150–400)
RBC: 4.5 MIL/uL (ref 3.87–5.11)
RDW: 15.9 % — ABNORMAL HIGH (ref 11.5–15.5)
WBC: 11 10*3/uL — ABNORMAL HIGH (ref 4.0–10.5)
nRBC: 0 % (ref 0.0–0.2)

## 2023-01-02 LAB — CREATININE, SERUM
Creatinine, Ser: 1.04 mg/dL — ABNORMAL HIGH (ref 0.44–1.00)
GFR, Estimated: >60 mL/min (ref >60)

## 2023-01-02 LAB — HIV ANTIBODY (ROUTINE TESTING W REFLEX): HIV Screen 4th Generation wRfx: NONREACTIVE

## 2023-01-02 MED ORDER — METHYLPREDNISOLONE SODIUM SUCC 125 MG IJ SOLR
125.0000 mg | Freq: Once | INTRAMUSCULAR | Status: AC
  Administered 2023-01-02: 125 mg via INTRAVENOUS
  Filled 2023-01-02: qty 2

## 2023-01-02 MED ORDER — ALBUTEROL SULFATE (2.5 MG/3ML) 0.083% IN NEBU
5.0000 mg | INHALATION_SOLUTION | Freq: Once | RESPIRATORY_TRACT | Status: AC
  Administered 2023-01-02: 5 mg via RESPIRATORY_TRACT
  Filled 2023-01-02: qty 6

## 2023-01-02 MED ORDER — ENOXAPARIN SODIUM 40 MG/0.4ML IJ SOSY
40.0000 mg | PREFILLED_SYRINGE | INTRAMUSCULAR | Status: DC
  Administered 2023-01-02 – 2023-01-11 (×10): 40 mg via SUBCUTANEOUS
  Filled 2023-01-02 (×10): qty 0.4

## 2023-01-02 MED ORDER — IPRATROPIUM BROMIDE 0.02 % IN SOLN
0.5000 mg | Freq: Once | RESPIRATORY_TRACT | Status: AC
  Administered 2023-01-02: 0.5 mg via RESPIRATORY_TRACT
  Filled 2023-01-02: qty 2.5

## 2023-01-02 MED ORDER — BUDESONIDE 0.5 MG/2ML IN SUSP
0.5000 mg | Freq: Two times a day (BID) | RESPIRATORY_TRACT | Status: DC
  Administered 2023-01-02 – 2023-01-03 (×2): 0.5 mg via RESPIRATORY_TRACT
  Filled 2023-01-02 (×2): qty 2

## 2023-01-02 MED ORDER — ACETAMINOPHEN 325 MG PO TABS
650.0000 mg | ORAL_TABLET | Freq: Four times a day (QID) | ORAL | Status: DC | PRN
  Administered 2023-01-11: 650 mg via ORAL
  Administered 2023-01-11: 325 mg via ORAL
  Filled 2023-01-02 (×2): qty 2

## 2023-01-02 MED ORDER — ONDANSETRON HCL 4 MG PO TABS: 4.0000 mg | ORAL_TABLET | Freq: Four times a day (QID) | ORAL | Status: DC | PRN

## 2023-01-02 MED ORDER — SODIUM CHLORIDE 0.9% FLUSH
3.0000 mL | Freq: Two times a day (BID) | INTRAVENOUS | Status: DC
  Administered 2023-01-02 – 2023-01-12 (×20): 3 mL via INTRAVENOUS

## 2023-01-02 MED ORDER — IPRATROPIUM-ALBUTEROL 0.5-2.5 (3) MG/3ML IN SOLN
3.0000 mL | Freq: Four times a day (QID) | RESPIRATORY_TRACT | Status: DC
  Administered 2023-01-02 – 2023-01-03 (×5): 3 mL via RESPIRATORY_TRACT
  Filled 2023-01-02 (×5): qty 3

## 2023-01-02 MED ORDER — MAGNESIUM SULFATE 2 GM/50ML IV SOLN
2.0000 g | Freq: Once | INTRAVENOUS | Status: AC
  Administered 2023-01-02: 2 g via INTRAVENOUS
  Filled 2023-01-02: qty 50

## 2023-01-02 MED ORDER — ALBUTEROL SULFATE (2.5 MG/3ML) 0.083% IN NEBU
15.0000 mg/h | INHALATION_SOLUTION | Freq: Once | RESPIRATORY_TRACT | Status: AC
  Administered 2023-01-02: 15 mg/h via RESPIRATORY_TRACT
  Filled 2023-01-02: qty 3

## 2023-01-02 MED ORDER — METHYLPREDNISOLONE SODIUM SUCC 40 MG IJ SOLR
40.0000 mg | Freq: Two times a day (BID) | INTRAMUSCULAR | Status: DC
  Administered 2023-01-02 – 2023-01-03 (×2): 40 mg via INTRAVENOUS
  Filled 2023-01-02 (×2): qty 1

## 2023-01-02 MED ORDER — ONDANSETRON HCL 4 MG/2ML IJ SOLN: 4.0000 mg | Freq: Four times a day (QID) | INTRAMUSCULAR | Status: DC | PRN

## 2023-01-02 MED ORDER — ACETAMINOPHEN 650 MG RE SUPP: 650.0000 mg | Freq: Four times a day (QID) | RECTAL | Status: DC | PRN

## 2023-01-02 NOTE — ED Notes (Signed)
Call to rns phone not going through   I have called the desk to verify number

## 2023-01-02 NOTE — ED Notes (Signed)
ED TO INPATIENT HANDOFF REPORT  ED Nurse Name and Phone #: [phone]  S Name/Age/Gender Janice Blake 52 y.o. female Room/Bed: 004C/004C  Code Status   Code Status: Full Code  Home/SNF/Other Home Patient oriented to: self, place, time, and situation Is this baseline? Yes   Triage Complete: Triage complete  Chief Complaint Asthma exacerbation [J45.901]  Triage Note Pt arrives via POV. Reports Asthma attack since this morning. PT states she used her inhaler and nebulizer with no relief. PT denies chest pain. AxOx4. Pt is only able to speak in short sentences and is tripoding during triage.    Allergies Allergies  Allergen Reactions   Amoxicillin Hives and Itching   Levaquin [Levofloxacin In D5w] Hives and Itching    Level of Care/Admitting Diagnosis ED Disposition     ED Disposition  Admit   Condition  --   Comment  Hospital Area: [facility] [[id number]]  Level of Care: Med-Surg [16]  May place patient in observation at [facility] or [facility] if equivalent level of care is available:: Yes  Covid Evaluation: Confirmed COVID Negative  Diagnosis: Asthma exacerbation [607371]  Admitting Physician: Zannie Cove [[id number]]  Attending Physician: Zannie Cove [[id number]]          B Medical/Surgery History 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    Nasal polyps    Shortness of breath    "just related to the asthma" (05/05/2013)   Past Surgical History:  Procedure Laterality Date   FRACTURE SURGERY     NASAL POLYP SURGERY Bilateral 2011   NASAL SINUS SURGERY Bilateral 11/03/2014   Procedure: BILATERAL ENDOSCOPIC POLYPECTOMY ETHMOIDECTOMY MAXILLARY ANTROSTOMY ;  Surgeon: Serena Colonel, MD;  Location: [facility];  Service: ENT;  Laterality: Bilateral;   PATELLA FRACTURE SURGERY Right 1996   "crushed;  rod w/12 screws on one side, 6 screws on the other"   TUBAL LIGATION  1994     A IV Location/Drains/Wounds Patient Lines/Drains/Airways Status     Active Line/Drains/Airways     Name Placement date Placement time Site Days   Peripheral IV 01/02/23 22 G 1" Left;Posterior;Proximal Forearm 01/02/23  1207  Forearm  less than 1            Intake/Output Last 24 hours No intake or output data in the 24 hours ending 01/02/23 1725  Labs/Imaging Results for orders placed or performed during the hospital encounter of 01/02/23 (from the past 48 hour(s))  CBC with Differential/Platelet     Status: Abnormal   Collection Time: 01/02/23 11:50 AM  Result Value Ref Range   WBC 10.2 4.0 - 10.5 K/uL   RBC 4.54 3.87 - 5.11 MIL/uL   Hemoglobin 12.5 12.0 - 15.0 g/dL   HCT 06.2 69.4 - 85.4 %   MCV 89.4 80.0 - 100.0 fL   MCH 27.5 26.0 - 34.0 pg   MCHC 30.8 30.0 - 36.0 g/dL   RDW 62.7 (H) 03.5 - 00.9 %   Platelets 422 (H) 150 - 400 K/uL   nRBC 0.0 0.0 - 0.2 %   Neutrophils Relative % 55 %   Neutro Abs 5.6 1.7 - 7.7 K/uL   Lymphocytes Relative 28 %   Lymphs Abs 2.8 0.7 - 4.0 K/uL   Monocytes Relative 8 %   Monocytes Absolute 0.8  0.1 - 1.0 K/uL   Eosinophils Relative 8 %   Eosinophils Absolute 0.9 (H) 0.0 - 0.5 K/uL   Basophils Relative 1 %   Basophils Absolute 0.1 0.0 - 0.1 K/uL   Immature Granulocytes 0 %   Abs Immature Granulocytes 0.02 0.00 - 0.07 K/uL    Comment: Performed at [facility], [address], [place], [place] [address]  Basic metabolic panel     Status: Abnormal   Collection Time: 01/02/23 11:50 AM  Result Value Ref Range   Sodium 136 135 - 145 mmol/L   Potassium 3.5 3.5 - 5.1 mmol/L   Chloride 102 98 - 111 mmol/L   CO2 26 22 - 32 mmol/L   Glucose, Bld 85 70 - 99 mg/dL    Comment: Glucose reference range applies only to samples taken after fasting for at least 8 hours.   BUN 14 6 - 20 mg/dL   Creatinine, Ser 6.04 0.44 - 1.00 mg/dL   Calcium 8.7 (L) 8.9 - 10.3  mg/dL   GFR, Estimated >54 >09 mL/min    Comment: (NOTE) Calculated using the CKD-EPI Creatinine Equation (2021)    Anion gap 8 5 - 15    Comment: Performed at [facility], [address], [place], [place] [address]   No results found.  Pending Labs Unresulted Labs (From admission, onward)     Start     Ordered   01/09/23 0500  Creatinine, serum  (enoxaparin (LOVENOX)    CrCl >/= 30 ml/min)  Weekly,   R     Comments: while on enoxaparin therapy    01/02/23 1649   01/03/23 0500  Comprehensive metabolic panel  Tomorrow morning,   R        01/02/23 1649   01/03/23 0500  CBC  Tomorrow morning,   R        01/02/23 1649   01/02/23 1648  HIV Antibody (routine testing w rflx)  (HIV Antibody (Routine testing w reflex) panel)  Once,   R        01/02/23 1649   01/02/23 1648  CBC  (enoxaparin (LOVENOX)    CrCl >/= 30 ml/min)  Once,   R       Comments: Baseline for enoxaparin therapy IF NOT ALREADY DRAWN.  Notify MD if PLT < 100 K.    01/02/23 1649   01/02/23 1648  Creatinine, serum  (enoxaparin (LOVENOX)    CrCl >/= 30 ml/min)  Once,   R       Comments: Baseline for enoxaparin therapy IF NOT ALREADY DRAWN.    01/02/23 1649            Vitals/Pain Today's Vitals   01/02/23 1530 01/02/23 1549 01/02/23 1600 01/02/23 1630  BP: 132/88  (!) 141/86 (!) 150/92  Pulse: 73  90 83  Resp: (!) 22  (!) 24 18  Temp:  99 F (37.2 C)    SpO2: 91%  92% 92%  Weight:      Height:      PainSc:        Isolation Precautions No active isolations  Medications Medications  methylPREDNISolone sodium succinate (SOLU-MEDROL) 40 mg/mL injection 40 mg (has no administration in time range)  ipratropium-albuterol (DUONEB) 0.5-2.5 (3) MG/3ML nebulizer solution 3 mL (has no administration in time range)  budesonide (PULMICORT) nebulizer solution 0.5 mg (has no administration in time range)  enoxaparin (LOVENOX) injection 40 mg (has no administration in time range)  sodium chloride flush (  NS) 0.9  % injection 3 mL (has no administration in time range)  acetaminophen (TYLENOL) tablet 650 mg (has no administration in time range)    Or  acetaminophen (TYLENOL) suppository 650 mg (has no administration in time range)  ondansetron (ZOFRAN) tablet 4 mg (has no administration in time range)    Or  ondansetron (ZOFRAN) injection 4 mg (has no administration in time range)  methylPREDNISolone sodium succinate (SOLU-MEDROL) 125 mg/2 mL injection 125 mg (125 mg Intravenous Given 01/02/23 1208)  albuterol (PROVENTIL) (2.5 MG/3ML) 0.083% nebulizer solution 5 mg (5 mg Nebulization Given 01/02/23 1153)  ipratropium (ATROVENT) nebulizer solution 0.5 mg (0.5 mg Nebulization Given 01/02/23 1153)  magnesium sulfate IVPB 2 g 50 mL (0 g Intravenous Stopped 01/02/23 1324)  albuterol (PROVENTIL) (2.5 MG/3ML) 0.083% nebulizer solution (15 mg/hr Nebulization Given 01/02/23 1339)  ipratropium (ATROVENT) nebulizer solution 0.5 mg (0.5 mg Nebulization Given 01/02/23 1340)    Mobility walks     Focused Assessments Cardiac Assessment Handoff:  Cardiac Rhythm: Normal sinus rhythm No results found for: "CKTOTAL", "CKMB", "CKMBINDEX", "TROPONINI" No results found for: "DDIMER" Does the Patient currently have chest pain? No    R Recommendations: See Admitting Provider Note  Report given to:   Additional Notes:

## 2023-01-02 NOTE — H&P (Signed)
History and Physical    Patient: Janice Blake WUJ:[id number] DOB: March 29, 1971 DOA: 01/02/2023 DOS: the patient was seen and examined on 01/02/2023 PCP: Pcp, No  Patient coming from: Home  Chief Complaint:  Chief Complaint  Patient presents with   Asthma   Shortness of Breath   HPI: Janice Blake is a 52 y.o. female with medical history significant of asthma.  Patient had been having typical asthma flare symptoms for about 1 week.  She went to her PCP and was given prednisone 20 mg daily for 5 days.  Her symptoms mildly improved but never resolved and after completion of the prednisone her symptoms worsen.  She reports that in July she lost her job and thus her insurance and has been unable to purchase long-acting preventative asthma medications.  Previously she has been on [place].  She attempted to use albuterol MDI as well as nebulizer without any improvement in her symptoms.  She had no fever or chills.  Upon presentation to the ER she was afebrile and normotensive with O2 sats down to 90% on room air and improved up into the 96 to 98% range after application of oxygen.  Patient was given nebulizer treatment by the EDP with improvement in her symptoms but she continues to have dyspnea on exertion and excessive coughing and wheezing after nebulizer treatments would wear off.  Previous testing at her primary care physician office revealed no evidence of flu, RSV or COVID.  Initial chest x-ray in the outpatient setting was also negative.  Hospitalist service has been asked to evaluate the patient for admission.   Review of Systems: As mentioned in the history of present illness. All other systems reviewed and are negative. 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    Nasal polyps    Shortness of breath    "just related to the asthma"  (05/05/2013)   Past Surgical History:  Procedure Laterality Date   FRACTURE SURGERY     NASAL POLYP SURGERY Bilateral 2011   NASAL SINUS SURGERY Bilateral 11/03/2014   Procedure: BILATERAL ENDOSCOPIC POLYPECTOMY ETHMOIDECTOMY MAXILLARY ANTROSTOMY ;  Surgeon: Serena Colonel, MD;  Location: [facility];  Service: ENT;  Laterality: Bilateral;   PATELLA FRACTURE SURGERY Right 1996   "crushed; rod w/12 screws on one side, 6 screws on the other"   TUBAL LIGATION  1994   Social History:  reports that she has never smoked. She has never used smokeless tobacco. She reports current alcohol use. She reports that she does not use drugs.  Allergies  Allergen Reactions   Amoxicillin Hives and Itching   Levaquin [Levofloxacin In D5w] Hives and Itching    Family History  Problem Relation Age of Onset   Allergic rhinitis Mother    Asthma Father    Hypertension Father    Diabetes Maternal Grandmother    Hypertension Maternal Grandfather    Cancer Paternal Grandmother    Cancer Paternal Grandfather        lung cancer    Asthma Son     Prior to Admission medications   Medication Sig Start Date End Date Taking? Authorizing Provider  albuterol (PROVENTIL) (2.5 MG/3ML) 0.083% nebulizer solution Take 3 mLs (2.5 mg total) by nebulization every 6 (six) hours as needed for wheezing or shortness of breath. 12/28/22  Eber Hong, MD  albuterol (VENTOLIN HFA) 108 (90 Base) MCG/ACT inhaler Inhale 2 puffs into the lungs every 6 (six) hours as needed for wheezing or shortness of breath. 09/25/22   Marcine Matar, MD  Budeson-Glycopyrrol-Formoterol (BREZTRI AEROSPHERE) 160-9-4.8 MCG/ACT AERO Inhale 2 puffs into the lungs in the morning and at bedtime. 06/18/22   Nehemiah Settle, FNP  Budeson-Glycopyrrol-Formoterol (BREZTRI AEROSPHERE) 160-9-4.8 MCG/ACT AERO Inhale 2 puffs into the lungs 2 (two) times daily. 09/25/22   Marcine Matar, MD  cetirizine (ZYRTEC) 10 MG chewable tablet Chew 10 mg by mouth  daily.    [provider]  clobetasol ointment (TEMOVATE) 0.05 % Apply 1 Application topically 2 (two) times daily. 09/25/22   Marcine Matar, MD  Fluticasone Propionate Timmothy Sours) 93 MCG/ACT EXHU Place 1 spray into the nose 2 (two) times daily as needed. 09/25/22   Marcine Matar, MD  montelukast (SINGULAIR) 10 MG tablet Take 1 tablet (10 mg total) by mouth at bedtime. 12/28/22   Eber Hong, MD  predniSONE (DELTASONE) 20 MG tablet Take 1 tablet (20 mg total) by mouth daily with breakfast for 5 days. 12/28/22 01/02/23  Eber Hong, MD    Physical Exam: Vitals:   01/02/23 1430 01/02/23 1500 01/02/23 1530 01/02/23 1549  BP: (!) 151/86 (!) 139/93 132/88   Pulse: 88 80 73   Resp: 16 19 (!) 22   Temp:    99 F (37.2 C)  SpO2: 91% 91% 91%   Weight:      Height:       Constitutional: NAD, calm, comfortable Respiratory: Bilateral coarse lung sounds on inspiration expiration.  Room air at rest with sats 96%.  No increased work of breathing while sitting in the stretcher. Cardiovascular: Regular rate and rhythm, no murmurs / rubs / gallops. No extremity edema. 2+ pedal pulses.  Abdomen: no tenderness, no masses palpated. No hepatosplenomegaly. Bowel sounds positive.  Musculoskeletal: no clubbing / cyanosis. No joint deformity upper and lower extremities. Good ROM, no contractures. Normal muscle tone.  Skin: no rashes, lesions, ulcers. No induration Neurologic: CN 2-12 grossly intact. Sensation intact, DTR normal. Strength 5/5 x all 4 extremities.  Psychiatric: Normal judgment and insight. Alert and oriented x 3. Normal mood.    Data Reviewed:  Sodium 136, potassium 3.5, glucose 85, BUN 14, creatinine 0.98, white count 10,200, neutrophils normal, hemoglobin 12.5, platelets 422,000  Assessment and Plan: Acute asthma exacerbation with intermittent hypoxemia Multifactorial secondary to inability to obtain long-acting preventative asthma medications as well as environmental exposure to  allergens. No evidence of pneumonia on clinical exam or based on history obtained from the patient Continue oxygen as needed and wean Solu-Medrol 40 mg every 12 hours Duonebs every 6 hours Budesonide nebs every 12 hours EOC consulted to assist with helping patient obtain coupon that will enable her to purchase breast tree or other long-acting medication    Advance Care Planning:   Code Status: Full Code   VTE prophylaxis: Lovenox  Consults: None  Family Communication: Patient only  Severity of Illness: The appropriate patient status for this patient is OBSERVATION. Observation status is judged to be reasonable and necessary in order to provide the required intensity of service to ensure the patient's safety. The patient's presenting symptoms, physical exam findings, and initial radiographic and laboratory data in the context of their medical condition is felt to place them at decreased risk for further clinical deterioration. Furthermore, it is anticipated that the patient will be medically stable for discharge from  the hospital within 2 midnights of admission.   Author: Junious Silk, NP 01/02/2023 4:49 PM  For on call review [email or web address].

## 2023-01-02 NOTE — ED Notes (Signed)
I have attempted to call the nurse on 5n  no answer

## 2023-01-02 NOTE — ED Provider Notes (Signed)
[facility] EMERGENCY DEPARTMENT AT [facility] Provider Note   CSN: [id number] Arrival date & time: 01/02/23  1132     History  Chief Complaint  Patient presents with   Asthma   Shortness of Breath    Janice Blake is a 52 y.o. female.  Patient is a 52 year old female with a history of asthma who is seen earlier in the week and reports she had 5 pills of prednisone but she ran out of that and she ran out of one of her inhalers and for the last few days she started having worsening wheezing and it became severe today.  She has significant tightness in her chest, shortness of breath, wheezing and her inhaler only takes it away for short time and then it comes right back.  She has not had any fever has had some mild sputum when she coughs but it is clear.  No significant leg swelling.  The history is provided by the patient.  Asthma Associated symptoms include shortness of breath.  Shortness of Breath      Home Medications Prior to Admission medications   Medication Sig Start Date End Date Taking? Authorizing Provider  albuterol (PROVENTIL) (2.5 MG/3ML) 0.083% nebulizer solution Take 3 mLs (2.5 mg total) by nebulization every 6 (six) hours as needed for wheezing or shortness of breath. 12/28/22   Eber Hong, MD  albuterol (VENTOLIN HFA) 108 (90 Base) MCG/ACT inhaler Inhale 2 puffs into the lungs every 6 (six) hours as needed for wheezing or shortness of breath. 09/25/22   Marcine Matar, MD  Budeson-Glycopyrrol-Formoterol (BREZTRI AEROSPHERE) 160-9-4.8 MCG/ACT AERO Inhale 2 puffs into the lungs in the morning and at bedtime. 06/18/22   Nehemiah Settle, FNP  Budeson-Glycopyrrol-Formoterol (BREZTRI AEROSPHERE) 160-9-4.8 MCG/ACT AERO Inhale 2 puffs into the lungs 2 (two) times daily. 09/25/22   Marcine Matar, MD  cetirizine (ZYRTEC) 10 MG chewable tablet Chew 10 mg by mouth daily.    [provider]  clobetasol ointment (TEMOVATE) 0.05 % Apply 1 Application  topically 2 (two) times daily. 09/25/22   Marcine Matar, MD  Fluticasone Propionate Timmothy Sours) 93 MCG/ACT EXHU Place 1 spray into the nose 2 (two) times daily as needed. 09/25/22   Marcine Matar, MD  montelukast (SINGULAIR) 10 MG tablet Take 1 tablet (10 mg total) by mouth at bedtime. 12/28/22   Eber Hong, MD  predniSONE (DELTASONE) 20 MG tablet Take 1 tablet (20 mg total) by mouth daily with breakfast for 5 days. 12/28/22 01/02/23  Eber Hong, MD      Allergies    Amoxicillin and Levaquin [levofloxacin in d5w]    Review of Systems   Review of Systems  Respiratory:  Positive for shortness of breath.     Physical Exam Updated Vital Signs BP (!) 139/93   Pulse 80   Temp 98.2 F (36.8 C)   Resp 19   Ht 5\' 9"  (1.753 m)   Wt 85.3 kg   SpO2 91%   BMI 27.76 kg/m  Physical Exam Vitals and nursing note reviewed.  Constitutional:      General: She is in acute distress.     Appearance: She is well-developed.  HENT:     Head: Normocephalic and atraumatic.  Eyes:     Pupils: Pupils are equal, round, and reactive to light.  Cardiovascular:     Rate and Rhythm: Normal rate and regular rhythm.     Heart sounds: Normal heart sounds. No murmur heard.  No friction rub.  Pulmonary:     Effort: Tachypnea and accessory muscle usage present.     Breath sounds: Wheezing present. No rales.     Comments: Only able to speak in 1-2 word sentences Abdominal:     General: Bowel sounds are normal. There is no distension.     Palpations: Abdomen is soft.     Tenderness: There is no abdominal tenderness. There is no guarding or rebound.  Musculoskeletal:        General: No tenderness. Normal range of motion.     Comments: No edema  Skin:    General: Skin is warm and dry.     Findings: No rash.  Neurological:     Mental Status: She is alert and oriented to person, place, and time.     Cranial Nerves: No cranial nerve deficit.  Psychiatric:        Behavior: Behavior normal.     ED  Results / Procedures / Treatments   Labs (all labs ordered are listed, but only abnormal results are displayed) Labs Reviewed  CBC WITH DIFFERENTIAL/PLATELET - Abnormal; Notable for the following components:      Result Value   RDW 15.9 (*)    Platelets 422 (*)    Eosinophils Absolute 0.9 (*)    All other components within normal limits  BASIC METABOLIC PANEL - Abnormal; Notable for the following components:   Calcium 8.7 (*)    All other components within normal limits    EKG EKG Interpretation Date/Time:  Wednesday January 02 2023 12:01:19 EDT Ventricular Rate:  99 PR Interval:  208 QRS Duration:  78 QT Interval:  368 QTC Calculation: 470 R Axis:   5  Text Interpretation: Sinus rhythm Ventricular premature complex Borderline prolonged PR interval Consider anterior infarct No significant change since last tracing Confirmed by Gwyneth Sprout ([id number]) on 01/02/2023 1:25:53 PM  Radiology No results found.  Procedures Procedures    Medications Ordered in ED Medications  methylPREDNISolone sodium succinate (SOLU-MEDROL) 125 mg/2 mL injection 125 mg (125 mg Intravenous Given 01/02/23 1208)  albuterol (PROVENTIL) (2.5 MG/3ML) 0.083% nebulizer solution 5 mg (5 mg Nebulization Given 01/02/23 1153)  ipratropium (ATROVENT) nebulizer solution 0.5 mg (0.5 mg Nebulization Given 01/02/23 1153)  magnesium sulfate IVPB 2 g 50 mL (0 g Intravenous Stopped 01/02/23 1324)  albuterol (PROVENTIL) (2.5 MG/3ML) 0.083% nebulizer solution (15 mg/hr Nebulization Given 01/02/23 1339)  ipratropium (ATROVENT) nebulizer solution 0.5 mg (0.5 mg Nebulization Given 01/02/23 1340)    ED Course/ Medical Decision Making/ A&P                                 Medical Decision Making Amount and/or Complexity of Data Reviewed Labs: ordered. Decision-making details documented in ED Course. ECG/medicine tests: ordered and independent interpretation performed. Decision-making details documented in ED  Course.  Risk Prescription drug management.   Pt with multiple medical problems and comorbidities and presenting today with a complaint that caries a high risk for morbidity and mortality.  Pt with typical asthma exacerbation  symptoms.  No infectious sx, productive cough or other complaints.  Wheezing on exam with some respiratory distress.  will give steroids, magnesium, albuterol/atrovent and recheck.  3:40 PM Work of breathing has improved but patient is still wheezing diffusely.  1 hour-long CAT and another round of Atrovent ordered.   3:40 PM After hour-long neb patient still is having diffuse wheezing, sats of 90  to 91% on room air but no longer having accessory muscle use or tachypnea.  Given ongoing symptoms will admit for ongoing asthma care.  CRITICAL CARE Performed by: Marylin Lathon Total critical care time: 30 minutes Critical care time was exclusive of separately billable procedures and treating other patients. Critical care was necessary to treat or prevent imminent or life-threatening deterioration. Critical care was time spent personally by me on the following activities: development of treatment plan with patient and/or surrogate as well as nursing, discussions with consultants, evaluation of patient's response to treatment, examination of patient, obtaining history from patient or surrogate, ordering and performing treatments and interventions, ordering and review of laboratory studies, ordering and review of radiographic studies, pulse oximetry and re-evaluation of patient's condition.        Final Clinical Impression(s) / ED Diagnoses Final diagnoses:  Severe persistent asthma with exacerbation    Rx / DC Orders ED Discharge Orders     None         Gwyneth Sprout, MD 01/02/23 1540

## 2023-01-02 NOTE — ED Triage Notes (Signed)
Pt arrives via POV. Reports Asthma attack since this morning. PT states she used her inhaler and nebulizer with no relief. PT denies chest pain. AxOx4. Pt is only able to speak in short sentences and is tripoding during triage.

## 2023-01-02 NOTE — ED Notes (Signed)
5n is ready to be  sent up

## 2023-01-03 ENCOUNTER — Observation Stay (HOSPITAL_COMMUNITY): Payer: Medicaid Other

## 2023-01-03 DIAGNOSIS — J4551 Severe persistent asthma with (acute) exacerbation: Secondary | ICD-10-CM | POA: Diagnosis present

## 2023-01-03 DIAGNOSIS — Z597 Insufficient social insurance and welfare support: Secondary | ICD-10-CM | POA: Diagnosis not present

## 2023-01-03 DIAGNOSIS — Z833 Family history of diabetes mellitus: Secondary | ICD-10-CM | POA: Diagnosis not present

## 2023-01-03 DIAGNOSIS — K5900 Constipation, unspecified: Secondary | ICD-10-CM | POA: Diagnosis present

## 2023-01-03 DIAGNOSIS — L281 Prurigo nodularis: Secondary | ICD-10-CM | POA: Diagnosis present

## 2023-01-03 DIAGNOSIS — J339 Nasal polyp, unspecified: Secondary | ICD-10-CM | POA: Diagnosis present

## 2023-01-03 DIAGNOSIS — Z79899 Other long term (current) drug therapy: Secondary | ICD-10-CM | POA: Diagnosis not present

## 2023-01-03 DIAGNOSIS — D75839 Thrombocytosis, unspecified: Secondary | ICD-10-CM | POA: Diagnosis present

## 2023-01-03 DIAGNOSIS — Z801 Family history of malignant neoplasm of trachea, bronchus and lung: Secondary | ICD-10-CM | POA: Diagnosis not present

## 2023-01-03 DIAGNOSIS — D72829 Elevated white blood cell count, unspecified: Secondary | ICD-10-CM | POA: Diagnosis not present

## 2023-01-03 DIAGNOSIS — J4489 Other specified chronic obstructive pulmonary disease: Secondary | ICD-10-CM | POA: Diagnosis present

## 2023-01-03 DIAGNOSIS — J45901 Unspecified asthma with (acute) exacerbation: Secondary | ICD-10-CM

## 2023-01-03 DIAGNOSIS — Z7951 Long term (current) use of inhaled steroids: Secondary | ICD-10-CM | POA: Diagnosis not present

## 2023-01-03 DIAGNOSIS — Z1152 Encounter for screening for COVID-19: Secondary | ICD-10-CM | POA: Diagnosis not present

## 2023-01-03 DIAGNOSIS — Z560 Unemployment, unspecified: Secondary | ICD-10-CM | POA: Diagnosis not present

## 2023-01-03 DIAGNOSIS — Z881 Allergy status to other antibiotic agents status: Secondary | ICD-10-CM | POA: Diagnosis not present

## 2023-01-03 DIAGNOSIS — Z880 Allergy status to penicillin: Secondary | ICD-10-CM | POA: Diagnosis not present

## 2023-01-03 DIAGNOSIS — Z825 Family history of asthma and other chronic lower respiratory diseases: Secondary | ICD-10-CM | POA: Diagnosis not present

## 2023-01-03 DIAGNOSIS — J310 Chronic rhinitis: Secondary | ICD-10-CM | POA: Diagnosis present

## 2023-01-03 DIAGNOSIS — Z8249 Family history of ischemic heart disease and other diseases of the circulatory system: Secondary | ICD-10-CM | POA: Diagnosis not present

## 2023-01-03 DIAGNOSIS — R0902 Hypoxemia: Secondary | ICD-10-CM | POA: Diagnosis present

## 2023-01-03 DIAGNOSIS — R0609 Other forms of dyspnea: Secondary | ICD-10-CM

## 2023-01-03 LAB — COMPREHENSIVE METABOLIC PANEL
ALT: 10 U/L (ref 0–44)
AST: 12 U/L — ABNORMAL LOW (ref 15–41)
Albumin: 3 g/dL — ABNORMAL LOW (ref 3.5–5.0)
Alkaline Phosphatase: 84 U/L (ref 38–126)
Anion gap: 8 (ref 5–15)
BUN: 11 mg/dL (ref 6–20)
CO2: 25 mmol/L (ref 22–32)
Calcium: 8.5 mg/dL — ABNORMAL LOW (ref 8.9–10.3)
Chloride: 101 mmol/L (ref 98–111)
Creatinine, Ser: 0.88 mg/dL (ref 0.44–1.00)
GFR, Estimated: >60 mL/min (ref >60)
Glucose, Bld: 106 mg/dL — ABNORMAL HIGH (ref 70–99)
Potassium: 4.1 mmol/L (ref 3.5–5.1)
Sodium: 134 mmol/L — ABNORMAL LOW (ref 135–145)
Total Bilirubin: 0.4 mg/dL (ref 0.3–1.2)
Total Protein: 6.9 g/dL (ref 6.5–8.1)

## 2023-01-03 LAB — ECHOCARDIOGRAM COMPLETE
AR max vel: 2.54 cm2
AV Peak grad: 10 mmHg
Ao pk vel: 1.58 m/s
Area-P 1/2: 3.27 cm2
Height: 69 in
S' Lateral: 2.9 cm
Weight: 3008 [oz_av]

## 2023-01-03 LAB — CBC
HCT: 36.7 % (ref 36.0–46.0)
Hemoglobin: 11.5 g/dL — ABNORMAL LOW (ref 12.0–15.0)
MCH: 27.4 pg (ref 26.0–34.0)
MCHC: 31.3 g/dL (ref 30.0–36.0)
MCV: 87.4 fL (ref 80.0–100.0)
Platelets: 432 10*3/uL — ABNORMAL HIGH (ref 150–400)
RBC: 4.2 MIL/uL (ref 3.87–5.11)
RDW: 15.9 % — ABNORMAL HIGH (ref 11.5–15.5)
WBC: 11.2 10*3/uL — ABNORMAL HIGH (ref 4.0–10.5)
nRBC: 0 % (ref 0.0–0.2)

## 2023-01-03 MED ORDER — METHYLPREDNISOLONE SODIUM SUCC 125 MG IJ SOLR
60.0000 mg | Freq: Two times a day (BID) | INTRAMUSCULAR | Status: DC
  Administered 2023-01-03 – 2023-01-04 (×2): 60 mg via INTRAVENOUS
  Filled 2023-01-03 (×2): qty 2

## 2023-01-03 MED ORDER — MONTELUKAST SODIUM 10 MG PO TABS
10.0000 mg | ORAL_TABLET | Freq: Every day | ORAL | Status: DC
  Administered 2023-01-03 – 2023-01-11 (×9): 10 mg via ORAL
  Filled 2023-01-03 (×9): qty 1

## 2023-01-03 MED ORDER — IPRATROPIUM-ALBUTEROL 0.5-2.5 (3) MG/3ML IN SOLN
3.0000 mL | Freq: Three times a day (TID) | RESPIRATORY_TRACT | Status: DC
  Administered 2023-01-04 – 2023-01-05 (×4): 3 mL via RESPIRATORY_TRACT
  Filled 2023-01-03 (×4): qty 3

## 2023-01-03 MED ORDER — POTASSIUM CHLORIDE CRYS ER 20 MEQ PO TBCR
40.0000 meq | EXTENDED_RELEASE_TABLET | Freq: Once | ORAL | Status: AC
  Administered 2023-01-03: 40 meq via ORAL
  Filled 2023-01-03: qty 2

## 2023-01-03 MED ORDER — MOMETASONE FURO-FORMOTEROL FUM 100-5 MCG/ACT IN AERO
2.0000 | INHALATION_SPRAY | Freq: Two times a day (BID) | RESPIRATORY_TRACT | Status: DC
  Administered 2023-01-03 – 2023-01-07 (×9): 2 via RESPIRATORY_TRACT
  Filled 2023-01-03 (×2): qty 8.8

## 2023-01-03 MED ORDER — FUROSEMIDE 10 MG/ML IJ SOLN
40.0000 mg | Freq: Once | INTRAMUSCULAR | Status: AC
  Administered 2023-01-03: 40 mg via INTRAVENOUS
  Filled 2023-01-03: qty 4

## 2023-01-03 NOTE — Progress Notes (Signed)
Mobility Specialist: Progress Note   01/03/23 1228  Mobility  Activity Ambulated independently in hallway  Level of Assistance Independent  Assistive Device None  Distance Ambulated (ft) 300 ft  Activity Response Tolerated well  Mobility Referral Yes  $Mobility charge 1 Mobility  Mobility Specialist Start Time (ACUTE ONLY) 0957  Mobility Specialist Stop Time (ACUTE ONLY) 1002  Mobility Specialist Time Calculation (min) (ACUTE ONLY) 5 min    During Mobility: SpO2 87% RA Post Mobility: SpO2 90-91% RA  Pt was agreeable to mobility session - received in bed. Towards EOS pt had c/o SOB, SpO2 desat 87% RA. Returned to room without fault. Maintained SpO2 90-91% RA post-mobility. Left on EOB with all needs met, call bell in reach.   Maurene Capes Mobility Specialist Please contact via SecureChat or Rehab office at [phone]

## 2023-01-03 NOTE — Plan of Care (Signed)
Patient alert/oriented X4. Patient compliant with medication administration and has audible wheezing. Patient tolerated IV solu-medrol. Patient was up in chair for a few hours. VSS, no complaints at this time.   Problem: Education: Goal: Knowledge of General Education information will improve Description: Including pain rating scale, medication(s)/side effects and non-pharmacologic comfort measures Outcome: Progressing   Problem: Health Behavior/Discharge Planning: Goal: Ability to manage health-related needs will improve Outcome: Progressing   Problem: Clinical Measurements: Goal: Ability to maintain clinical measurements within normal limits will improve Outcome: Progressing   Problem: Clinical Measurements: Goal: Will remain free from infection Outcome: Progressing   Problem: Clinical Measurements: Goal: Diagnostic test results will improve Outcome: Progressing   Problem: Clinical Measurements: Goal: Respiratory complications will improve Outcome: Progressing   Problem: Clinical Measurements: Goal: Cardiovascular complication will be avoided Outcome: Progressing   Problem: Activity: Goal: Risk for activity intolerance will decrease Outcome: Progressing   Problem: Nutrition: Goal: Adequate nutrition will be maintained Outcome: Progressing   Problem: Coping: Goal: Level of anxiety will decrease Outcome: Progressing   Problem: Clinical Measurements: Goal: Cardiovascular complication will be avoided Outcome: Progressing   Problem: Nutrition: Goal: Adequate nutrition will be maintained Outcome: Progressing   Problem: Coping: Goal: Level of anxiety will decrease Outcome: Progressing   Problem: Elimination: Goal: Will not experience complications related to bowel motility Outcome: Progressing   Problem: Coping: Goal: Level of anxiety will decrease Outcome: Progressing   Problem: Elimination: Goal: Will not experience complications related to bowel  motility Outcome: Progressing   Problem: Elimination: Goal: Will not experience complications related to urinary retention Outcome: Progressing   Problem: Pain Managment: Goal: General experience of comfort will improve Outcome: Progressing   Problem: Safety: Goal: Ability to remain free from injury will improve Outcome: Progressing   Problem: Skin Integrity: Goal: Risk for impaired skin integrity will decrease Outcome: Progressing   Problem: Safety: Goal: Ability to remain free from injury will improve Outcome: Progressing   Problem: Skin Integrity: Goal: Risk for impaired skin integrity will decrease Outcome: Progressing

## 2023-01-03 NOTE — Progress Notes (Addendum)
Asthma exacerbation    01/03/23 1318  TOC Brief Assessment  Insurance and Status Reviewed (pt without insurance, [facility] referral made for Medicaid screening)  Patient has primary care physician No  Home environment has been reviewed From home with family ( sister, niece, nephew)  Prior level of function: PTA independent with ADL's , no DME usage  Prior/Current Home Services No current home services  Social Determinants of Health Reivew SDOH reviewed no interventions necessary  Readmission risk has been reviewed No  Transition of care needs transition of care needs identified, TOC will continue to follow   Pt without PCP, no health insurance . Jobless since July . Limited income.Referral made with [facility] for Medicaid screening. Consult noted regarding medication assistance.Marland KitchenMarland KitchenTOC team watching for need. Post hospital f/u appointment and to establish primary care arrange with [facility] Alberteen Spindle and noted on AVS.  TOC team following and will assist with TOC needs.... Gae Gallop RN,BSN,CM [phone]

## 2023-01-03 NOTE — Progress Notes (Signed)
Echocardiogram 2D Echocardiogram has been performed.  Janice Blake 01/03/2023, 1:54 PM

## 2023-01-03 NOTE — Progress Notes (Signed)
PROGRESS NOTE    Janice Blake  ZOX:[id number] DOB: 21-Jan-1971 DOA: 01/02/2023 PCP: Pcp, No  52/F with Asthma/COPD overlap, exposed to second hand smoking at home presented to ED with DoE for 1 week, cough and wheezing, in addition had more dyspnea for few months which she attributes to being off ICS/LABA after losing insurance. Also H/o some Orthopnea and intermittent edema which was self limiting with elevation.    Subjective: Feels little better, still wheezing  Assessment and Plan:  Asthma exacerbation -Slowly improving, still with significant expiratory wheezes, continue IV steroids, nebs, wean O2 -denies URI symptoms at this time [facility] consult for med assistance  Intermittent edema, history of orthopnea -IV Lasix X1 -Follow-up echo -CMP, albumin in a.m.   DVT prophylaxis: Lovenox Code Status: Full code Family Communication: None present Disposition Plan: Home likely 1 to 2 days  Consultants:    Procedures:   Antimicrobials:    Objective: Vitals:   01/03/23 0108 01/03/23 0344 01/03/23 0741 01/03/23 0849  BP:  138/65 (!) 152/90   Pulse:  71 63   Resp:  19 16   Temp:  97.7 F (36.5 C) 97.7 F (36.5 C)   TempSrc:  Oral Oral   SpO2: 94% 99% 97% 96%  Weight:      Height:        Intake/Output Summary (Last 24 hours) at 01/03/2023 1237 Last data filed at 01/02/2023 2005 Gross per 24 hour  Intake 360 ml  Output --  Net 360 ml   Filed Weights   01/02/23 1139  Weight: 85.3 kg    Examination:  General exam: Appears calm and comfortable  Respiratory system: Diffuse expiratory wheezes Cardiovascular system: S1 & S2 heard, RRR.  Abd: nondistended, soft and nontender.Normal bowel sounds heard. Central nervous system: Alert and oriented. No focal neurological deficits. Extremities: no edema Skin: No rashes Psychiatry:  Mood & affect appropriate.     Data Reviewed:   CBC: Recent Labs  Lab 12/28/22 1521 01/02/23 1150 01/02/23 1856 01/03/23 0454   WBC 7.5 10.2 11.0* 11.2*  NEUTROABS  --  5.6  --   --   HGB 12.2 12.5 12.4 11.5*  HCT 38.8 40.6 39.1 36.7  MCV 91.7 89.4 86.9 87.4  PLT 388 422* 409* 432*   Basic Metabolic Panel: Recent Labs  Lab 12/28/22 1521 01/02/23 1150 01/02/23 1856 01/03/23 0454  NA 135 136  --  134*  K 4.2 3.5  --  4.1  CL 102 102  --  101  CO2 23 26  --  25  GLUCOSE 93 85  --  106*  BUN 14 14  --  11  CREATININE 0.95 0.98 1.04* 0.88  CALCIUM 8.8* 8.7*  --  8.5*   GFR: Estimated Creatinine Clearance: 86.1 mL/min (by C-G formula based on SCr of 0.88 mg/dL). Liver Function Tests: Recent Labs  Lab 12/28/22 1521 01/03/23 0454  AST 16 12*  ALT 15 10  ALKPHOS 93 84  BILITOT 0.5 0.4  PROT 7.8 6.9  ALBUMIN 3.5 3.0*   No results for input(s): "LIPASE", "AMYLASE" in the last 168 hours. No results for input(s): "AMMONIA" in the last 168 hours. Coagulation Profile: No results for input(s): "INR", "PROTIME" in the last 168 hours. Cardiac Enzymes: No results for input(s): "CKTOTAL", "CKMB", "CKMBINDEX", "TROPONINI" in the last 168 hours. BNP (last 3 results) No results for input(s): "PROBNP" in the last 8760 hours. HbA1C: No results for input(s): "HGBA1C" in the last 72 hours. CBG: No results for  input(s): "GLUCAP" in the last 168 hours. Lipid Profile: No results for input(s): "CHOL", "HDL", "LDLCALC", "TRIG", "CHOLHDL", "LDLDIRECT" in the last 72 hours. Thyroid Function Tests: No results for input(s): "TSH", "T4TOTAL", "FREET4", "T3FREE", "THYROIDAB" in the last 72 hours. Anemia Panel: No results for input(s): "VITAMINB12", "FOLATE", "FERRITIN", "TIBC", "IRON", "RETICCTPCT" in the last 72 hours. Urine analysis: No results found for: "COLORURINE", "APPEARANCEUR", "LABSPEC", "PHURINE", "GLUCOSEU", "HGBUR", "BILIRUBINUR", "KETONESUR", "PROTEINUR", "UROBILINOGEN", "NITRITE", "LEUKOCYTESUR" Sepsis Labs: @LABRCNTIP (procalcitonin:4,lacticidven:4)  ) Recent Results (from the past 240 hour(s))  Resp  panel by RT-PCR (RSV, Flu A&B, Covid) Anterior Nasal Swab     Status: None   Collection Time: 12/28/22  3:21 PM   Specimen: Anterior Nasal Swab  Result Value Ref Range Status   SARS Coronavirus 2 by RT PCR NEGATIVE NEGATIVE Final   Influenza A by PCR NEGATIVE NEGATIVE Final   Influenza B by PCR NEGATIVE NEGATIVE Final    Comment: (NOTE) The Xpert Xpress SARS-CoV-2/FLU/RSV plus assay is intended as an aid in the diagnosis of influenza from Nasopharyngeal swab specimens and should not be used as a sole basis for treatment. Nasal washings and aspirates are unacceptable for Xpert Xpress SARS-CoV-2/FLU/RSV testing.  Fact Sheet for Patients: BloggerCourse.com  Fact Sheet for Healthcare Providers: SeriousBroker.it  This test is not yet approved or cleared by the [place] FDA and has been authorized for detection and/or diagnosis of SARS-CoV-2 by FDA under an Emergency Use Authorization (EUA). This EUA will remain in effect (meaning this test can be used) for the duration of the COVID-19 declaration under Section 564(b)(1) of the Act, 21 U.S.C. section 360bbb-3(b)(1), unless the authorization is terminated or revoked.     Resp Syncytial Virus by PCR NEGATIVE NEGATIVE Final    Comment: (NOTE) Fact Sheet for Patients: BloggerCourse.com  Fact Sheet for Healthcare Providers: SeriousBroker.it  This test is not yet approved or cleared by the [place] FDA and has been authorized for detection and/or diagnosis of SARS-CoV-2 by FDA under an Emergency Use Authorization (EUA). This EUA will remain in effect (meaning this test can be used) for the duration of the COVID-19 declaration under Section 564(b)(1) of the Act, 21 U.S.C. section 360bbb-3(b)(1), unless the authorization is terminated or revoked.  Performed at [facility], [address] [place], [place] [address]       Radiology Studies: No results found.   Scheduled Meds:  budesonide  0.5 mg Nebulization BID   enoxaparin (LOVENOX) injection  40 mg Subcutaneous Q24H   ipratropium-albuterol  3 mL Nebulization Q6H   methylPREDNISolone (SOLU-MEDROL) injection  60 mg Intravenous Q12H   sodium chloride flush  3 mL Intravenous Q12H   Continuous Infusions:   LOS: 0 days    Time spent: 51    Zannie Cove, MD [facility]   01/03/2023, 12:37 PM   osed skin

## 2023-01-04 LAB — CBC
HCT: 39.5 % (ref 36.0–46.0)
Hemoglobin: 12.3 g/dL (ref 12.0–15.0)
MCH: 27.8 pg (ref 26.0–34.0)
MCHC: 31.1 g/dL (ref 30.0–36.0)
MCV: 89.4 fL (ref 80.0–100.0)
Platelets: 461 10*3/uL — ABNORMAL HIGH (ref 150–400)
RBC: 4.42 MIL/uL (ref 3.87–5.11)
RDW: 16.1 % — ABNORMAL HIGH (ref 11.5–15.5)
WBC: 15.6 10*3/uL — ABNORMAL HIGH (ref 4.0–10.5)
nRBC: 0 % (ref 0.0–0.2)

## 2023-01-04 LAB — COMPREHENSIVE METABOLIC PANEL
ALT: 12 U/L (ref 0–44)
AST: 14 U/L — ABNORMAL LOW (ref 15–41)
Albumin: 3.1 g/dL — ABNORMAL LOW (ref 3.5–5.0)
Alkaline Phosphatase: 78 U/L (ref 38–126)
Anion gap: 9 (ref 5–15)
BUN: 24 mg/dL — ABNORMAL HIGH (ref 6–20)
CO2: 26 mmol/L (ref 22–32)
Calcium: 8.9 mg/dL (ref 8.9–10.3)
Chloride: 99 mmol/L (ref 98–111)
Creatinine, Ser: 1.13 mg/dL — ABNORMAL HIGH (ref 0.44–1.00)
GFR, Estimated: 58 mL/min — ABNORMAL LOW (ref >60)
Glucose, Bld: 138 mg/dL — ABNORMAL HIGH (ref 70–99)
Potassium: 3.8 mmol/L (ref 3.5–5.1)
Sodium: 134 mmol/L — ABNORMAL LOW (ref 135–145)
Total Bilirubin: 0.5 mg/dL (ref 0.3–1.2)
Total Protein: 7.3 g/dL (ref 6.5–8.1)

## 2023-01-04 MED ORDER — METHYLPREDNISOLONE SODIUM SUCC 125 MG IJ SOLR
80.0000 mg | Freq: Two times a day (BID) | INTRAMUSCULAR | Status: DC
  Administered 2023-01-04 – 2023-01-07 (×6): 80 mg via INTRAVENOUS
  Filled 2023-01-04 (×6): qty 2

## 2023-01-04 MED ORDER — IPRATROPIUM-ALBUTEROL 0.5-2.5 (3) MG/3ML IN SOLN
3.0000 mL | RESPIRATORY_TRACT | Status: DC | PRN
  Administered 2023-01-07: 3 mL via RESPIRATORY_TRACT

## 2023-01-04 MED ORDER — GUAIFENESIN ER 600 MG PO TB12
600.0000 mg | ORAL_TABLET | Freq: Two times a day (BID) | ORAL | Status: DC
  Administered 2023-01-04 – 2023-01-12 (×17): 600 mg via ORAL
  Filled 2023-01-04 (×17): qty 1

## 2023-01-04 NOTE — Plan of Care (Signed)

## 2023-01-04 NOTE — Progress Notes (Signed)
Mobility Specialist: Progress Note   01/04/23 1140  Mobility  Activity Ambulated independently in hallway  Level of Assistance Independent  Assistive Device None  Distance Ambulated (ft) 550 ft  Activity Response Tolerated well  Mobility Referral Yes  $Mobility charge 1 Mobility  Mobility Specialist Start Time (ACUTE ONLY) 1112  Mobility Specialist Stop Time (ACUTE ONLY) 1129  Mobility Specialist Time Calculation (min) (ACUTE ONLY) 17 min    Pt was agreeable to mobility session - received in bed. Had c/o SOB towards EOS but otherwise asymptomatic throughout. Returned to room without fault. Left in bed with all needs met, call bell in reach.   Maurene Capes Mobility Specialist Please contact via SecureChat or Rehab office at [phone]

## 2023-01-04 NOTE — Progress Notes (Signed)
PROGRESS NOTE    Janice Blake  OAC:[id number] DOB: 09-Sep-1970 DOA: 01/02/2023 PCP: Pcp, No  52/F with Asthma/COPD overlap, exposed to second hand smoking at home presented to ED with DoE for 1 week, cough and wheezing, in addition had more dyspnea for few months which she attributes to being off ICS/LABA after losing insurance. Also H/o some Orthopnea and intermittent edema which was self limiting with elevation.    Subjective: Feels little better, still wheezing  Assessment and Plan:  Asthma exacerbation -Slowly improving, still with significant expiratory wheezes, continue IV steroids, nebs, wean O2 -denies URI symptoms at this time -[facility] consult for med assistance  Intermittent edema, history of orthopnea -sp IV Lasix X1 yesterday -Echo with preserved EF, normal RV, no diastolic dysfunction -Albumin is low normal at 3.1  Leukocytosis -Likely reactive from IV steroids, monitor  DVT prophylaxis: Lovenox Code Status: Full code Family Communication: None present Disposition Plan: Home likely 1 to 2 days  Consultants:    Procedures:   Antimicrobials:    Objective: Vitals:   01/03/23 2040 01/04/23 0504 01/04/23 0724 01/04/23 0743  BP:  (!) 138/90  (!) 143/90  Pulse: 82 75 75 70  Resp: 18 18 18 19   Temp:  97.8 F (36.6 C)  98.1 F (36.7 C)  TempSrc:  Oral  Oral  SpO2: 90% 99% 99% 92%  Weight:      Height:        Intake/Output Summary (Last 24 hours) at 01/04/2023 1013 Last data filed at 01/04/2023 0522 Gross per 24 hour  Intake 300 ml  Output --  Net 300 ml   Filed Weights   01/02/23 1139  Weight: 85.3 kg    Examination:  General exam: Appears calm and comfortable HEENT: No JVD CVS: S1-S2, regular rhythm, tachycardic Lungs: Diffuse expiratory wheezes and scattered rhonchi Abd: nondistended, soft and nontender.Normal bowel sounds heard. Central nervous system: Alert and oriented. No focal neurological deficits. Extremities: no edema Skin: No  rashes Psychiatry:  Mood & affect appropriate.     Data Reviewed:   CBC: Recent Labs  Lab 12/28/22 1521 01/02/23 1150 01/02/23 1856 01/03/23 0454 01/04/23 0352  WBC 7.5 10.2 11.0* 11.2* 15.6*  NEUTROABS  --  5.6  --   --   --   HGB 12.2 12.5 12.4 11.5* 12.3  HCT 38.8 40.6 39.1 36.7 39.5  MCV 91.7 89.4 86.9 87.4 89.4  PLT 388 422* 409* 432* 461*   Basic Metabolic Panel: Recent Labs  Lab 12/28/22 1521 01/02/23 1150 01/02/23 1856 01/03/23 0454 01/04/23 0352  NA 135 136  --  134* 134*  K 4.2 3.5  --  4.1 3.8  CL 102 102  --  101 99  CO2 23 26  --  25 26  GLUCOSE 93 85  --  106* 138*  BUN 14 14  --  11 24*  CREATININE 0.95 0.98 1.04* 0.88 1.13*  CALCIUM 8.8* 8.7*  --  8.5* 8.9   GFR: Estimated Creatinine Clearance: 67.1 mL/min (A) (by C-G formula based on SCr of 1.13 mg/dL (H)). Liver Function Tests: Recent Labs  Lab 12/28/22 1521 01/03/23 0454 01/04/23 0352  AST 16 12* 14*  ALT 15 10 12   ALKPHOS 93 84 78  BILITOT 0.5 0.4 0.5  PROT 7.8 6.9 7.3  ALBUMIN 3.5 3.0* 3.1*   No results for input(s): "LIPASE", "AMYLASE" in the last 168 hours. No results for input(s): "AMMONIA" in the last 168 hours. Coagulation Profile: No results for input(s): "INR", "  PROTIME" in the last 168 hours. Cardiac Enzymes: No results for input(s): "CKTOTAL", "CKMB", "CKMBINDEX", "TROPONINI" in the last 168 hours. BNP (last 3 results) No results for input(s): "PROBNP" in the last 8760 hours. HbA1C: No results for input(s): "HGBA1C" in the last 72 hours. CBG: No results for input(s): "GLUCAP" in the last 168 hours. Lipid Profile: No results for input(s): "CHOL", "HDL", "LDLCALC", "TRIG", "CHOLHDL", "LDLDIRECT" in the last 72 hours. Thyroid Function Tests: No results for input(s): "TSH", "T4TOTAL", "FREET4", "T3FREE", "THYROIDAB" in the last 72 hours. Anemia Panel: No results for input(s): "VITAMINB12", "FOLATE", "FERRITIN", "TIBC", "IRON", "RETICCTPCT" in the last 72 hours. Urine  analysis: No results found for: "COLORURINE", "APPEARANCEUR", "LABSPEC", "PHURINE", "GLUCOSEU", "HGBUR", "BILIRUBINUR", "KETONESUR", "PROTEINUR", "UROBILINOGEN", "NITRITE", "LEUKOCYTESUR" Sepsis Labs: @LABRCNTIP (procalcitonin:4,lacticidven:4)  ) Recent Results (from the past 240 hour(s))  Resp panel by RT-PCR (RSV, Flu A&B, Covid) Anterior Nasal Swab     Status: None   Collection Time: 12/28/22  3:21 PM   Specimen: Anterior Nasal Swab  Result Value Ref Range Status   SARS Coronavirus 2 by RT PCR NEGATIVE NEGATIVE Final   Influenza A by PCR NEGATIVE NEGATIVE Final   Influenza B by PCR NEGATIVE NEGATIVE Final    Comment: (NOTE) The Xpert Xpress SARS-CoV-2/FLU/RSV plus assay is intended as an aid in the diagnosis of influenza from Nasopharyngeal swab specimens and should not be used as a sole basis for treatment. Nasal washings and aspirates are unacceptable for Xpert Xpress SARS-CoV-2/FLU/RSV testing.  Fact Sheet for Patients: BloggerCourse.com  Fact Sheet for Healthcare Providers: SeriousBroker.it  This test is not yet approved or cleared by the [place] FDA and has been authorized for detection and/or diagnosis of SARS-CoV-2 by FDA under an Emergency Use Authorization (EUA). This EUA will remain in effect (meaning this test can be used) for the duration of the COVID-19 declaration under Section 564(b)(1) of the Act, 21 U.S.C. section 360bbb-3(b)(1), unless the authorization is terminated or revoked.     Resp Syncytial Virus by PCR NEGATIVE NEGATIVE Final    Comment: (NOTE) Fact Sheet for Patients: BloggerCourse.com  Fact Sheet for Healthcare Providers: SeriousBroker.it  This test is not yet approved or cleared by the [place] FDA and has been authorized for detection and/or diagnosis of SARS-CoV-2 by FDA under an Emergency Use Authorization (EUA). This EUA will  remain in effect (meaning this test can be used) for the duration of the COVID-19 declaration under Section 564(b)(1) of the Act, 21 U.S.C. section 360bbb-3(b)(1), unless the authorization is terminated or revoked.  Performed at [facility], [address] [place], [place] [address]      Radiology Studies: ECHOCARDIOGRAM COMPLETE  Result Date: 01/03/2023    ECHOCARDIOGRAM REPORT   Patient Name:   Janice Blake Date of Exam: 01/03/2023 Medical Rec #:  [id number]         Height:       69.0 in Accession #:    [id number]        Weight:       188.0 lb Date of Birth:  20-Nov-1970          BSA:          2.012 m Patient Age:    52 years          BP:           152/90 mmHg Patient Gender: F                 HR:  75 bpm. Exam Location:  Inpatient Procedure: 2D Echo, Cardiac Doppler and Color Doppler Indications:    Dyspnea R06.00  History:        Patient has no prior history of Echocardiogram examinations.                 Signs/Symptoms:Shortness of Breath.  Sonographer:    Lucendia Herrlich Referring Phys: [phone] Hiep Ollis IMPRESSIONS  1. Left ventricular ejection fraction, by estimation, is 60 to 65%. The left ventricle has normal function. The left ventricle has no regional wall motion abnormalities. Left ventricular diastolic parameters were normal.  2. Right ventricular systolic function is normal. The right ventricular size is normal.  3. The mitral valve is normal in structure. No evidence of mitral valve regurgitation. No evidence of mitral stenosis.  4. The aortic valve is normal in structure. Aortic valve regurgitation is not visualized. No aortic stenosis is present.  5. The inferior vena cava is dilated in size with >50% respiratory variability, suggesting right atrial pressure of 8 mmHg. FINDINGS  Left Ventricle: Left ventricular ejection fraction, by estimation, is 60 to 65%. The left ventricle has normal function. The left ventricle has no regional wall motion abnormalities. The  left ventricular internal cavity size was normal in size. There is  no left ventricular hypertrophy. Left ventricular diastolic parameters were normal. Right Ventricle: The right ventricular size is normal. No increase in right ventricular wall thickness. Right ventricular systolic function is normal. Left Atrium: Left atrial size was normal in size. Right Atrium: Right atrial size was normal in size. Pericardium: There is no evidence of pericardial effusion. Mitral Valve: The mitral valve is normal in structure. No evidence of mitral valve regurgitation. No evidence of mitral valve stenosis. Tricuspid Valve: The tricuspid valve is normal in structure. Tricuspid valve regurgitation is trivial. No evidence of tricuspid stenosis. Aortic Valve: The aortic valve is normal in structure. Aortic valve regurgitation is not visualized. No aortic stenosis is present. Aortic valve peak gradient measures 10.0 mmHg. Pulmonic Valve: The pulmonic valve was normal in structure. Pulmonic valve regurgitation is trivial. No evidence of pulmonic stenosis. Aorta: The aortic root is normal in size and structure. Venous: The inferior vena cava is dilated in size with greater than 50% respiratory variability, suggesting right atrial pressure of 8 mmHg. IAS/Shunts: No atrial level shunt detected by color flow Doppler.  LEFT VENTRICLE PLAX 2D LVIDd:         4.30 cm   Diastology LVIDs:         2.90 cm   LV e' medial:    10.60 cm/s LV PW:         1.00 cm   LV E/e' medial:  6.2 LV IVS:        0.90 cm   LV e' lateral:   12.40 cm/s LVOT diam:     2.10 cm   LV E/e' lateral: 5.3 LV SV:         68 LV SV Index:   34 LVOT Area:     3.46 cm  RIGHT VENTRICLE             IVC RV S prime:     16.90 cm/s  IVC diam: 2.60 cm TAPSE (M-mode): 2.3 cm LEFT ATRIUM           Index        RIGHT ATRIUM          Index LA diam:      3.40 cm 1.69 cm/m  RA Area:     9.19 cm LA Vol (A2C): 50.3 ml 25.00 ml/m  RA Volume:   16.60 ml 8.25 ml/m LA Vol (A4C): 30.8 ml  15.31 ml/m  AORTIC VALVE AV Area (Vmax): 2.54 cm AV Vmax:        158.00 cm/s AV Peak Grad:   10.0 mmHg LVOT Vmax:      116.00 cm/s LVOT Vmean:     73.600 cm/s LVOT VTI:       0.196 m  AORTA Ao Root diam: 3.50 cm Ao Asc diam:  3.20 cm MITRAL VALVE               TRICUSPID VALVE MV Area (PHT): 3.27 cm    TR Peak grad:   26.0 mmHg MV Decel Time: 232 msec    TR Vmax:        255.00 cm/s MV E velocity: 65.80 cm/s MV A velocity: 79.40 cm/s  SHUNTS MV E/A ratio:  0.83        Systemic VTI:  0.20 m                            Systemic Diam: 2.10 cm Arvilla Meres MD Electronically signed by Arvilla Meres MD Signature Date/Time: 01/03/2023/2:03:46 PM    Final      Scheduled Meds:  enoxaparin (LOVENOX) injection  40 mg Subcutaneous Q24H   guaiFENesin  600 mg Oral BID   ipratropium-albuterol  3 mL Nebulization TID   methylPREDNISolone (SOLU-MEDROL) injection  80 mg Intravenous Q12H   mometasone-formoterol  2 puff Inhalation BID   montelukast  10 mg Oral QHS   sodium chloride flush  3 mL Intravenous Q12H   Continuous Infusions:   LOS: 1 day    Time spent:    Zannie Cove, MD [facility]   01/04/2023, 10:13 AM   osed skin

## 2023-01-04 NOTE — Plan of Care (Signed)
Problem: Education: Goal: Knowledge of General Education information will improve Description: Including pain rating scale, medication(s)/side effects and non-pharmacologic comfort measures Outcome: Progressing   Problem: Clinical Measurements: Goal: Ability to maintain clinical measurements within normal limits will improve Outcome: Progressing   Problem: Clinical Measurements: Goal: Respiratory complications will improve Outcome: Progressing

## 2023-01-05 MED ORDER — IPRATROPIUM-ALBUTEROL 0.5-2.5 (3) MG/3ML IN SOLN
3.0000 mL | Freq: Four times a day (QID) | RESPIRATORY_TRACT | Status: DC
  Administered 2023-01-05 – 2023-01-07 (×8): 3 mL via RESPIRATORY_TRACT
  Filled 2023-01-05 (×9): qty 3

## 2023-01-05 NOTE — Plan of Care (Signed)

## 2023-01-05 NOTE — Progress Notes (Signed)
Mobility Specialist: Progress Note   01/05/23 1222  Mobility  Activity Ambulated independently in hallway  Level of Assistance Independent  Assistive Device None  Distance Ambulated (ft) 550 ft  Activity Response Tolerated well  Mobility Referral Yes  $Mobility charge 1 Mobility  Mobility Specialist Start Time (ACUTE ONLY) 0940  Mobility Specialist Stop Time (ACUTE ONLY) 0946  Mobility Specialist Time Calculation (min) (ACUTE ONLY) 6 min    Immediately Post Mobility: SpO2 95% RA  Received pt in bed having no complaints and agreeable to mobility. Pt was asymptomatic throughout ambulation and returned to room w/o fault. Left in bed w/ call bell in reach and all needs met.   Maurene Capes Mobility Specialist Please contact via SecureChat or Rehab office at [phone]

## 2023-01-05 NOTE — Progress Notes (Addendum)
PROGRESS NOTE    Janice Blake  XBM:[id number] DOB: Sep 14, 1970 DOA: 01/02/2023 PCP: Pcp, No  52/F with Asthma/COPD overlap, exposed to second hand smoking at home presented to ED with DoE for 1 week, cough and wheezing, in addition had more dyspnea for few months which she attributes to being off ICS/LABA after losing insurance. Also H/o some Orthopnea and intermittent edema which was self limiting with elevation.  -Slowly improving on IV steroids, nebs  Subjective: -Slowly improving, rough night yesterday with coughing and wheezing  Assessment and Plan:  Asthma exacerbation -Slowly improving, continue to have moderate expiratory wheezes, continue IV steroids, nebs-> changed to 4 times daily and PRN, weaned off O2 -Dulera, Mucinex, flutter valve, -denies URI symptoms at this time -ToC consult for med assistance  Intermittent edema, history of orthopnea -sp IV Lasix X1 yesterday -Echo with preserved EF, normal RV, no diastolic dysfunction -Albumin is low normal at 3.1  Leukocytosis -Likely reactive from IV steroids, monitor  DVT prophylaxis: Lovenox Code Status: Full code Family Communication: None present Disposition Plan: Home likely 1 to 2 days  Consultants:    Procedures:   Antimicrobials:    Objective: Vitals:   01/05/23 0448 01/05/23 0713 01/05/23 0839 01/05/23 0840  BP: (!) 145/86 (!) 144/95    Pulse: 64 81 78   Resp: 18 (!) 22 20   Temp: 98.1 F (36.7 C) 97.9 F (36.6 C)    TempSrc: Oral Oral    SpO2: 97% 96% 97% 96%  Weight:      Height:        Intake/Output Summary (Last 24 hours) at 01/05/2023 1034 Last data filed at 01/04/2023 1500 Gross per 24 hour  Intake 240 ml  Output --  Net 240 ml   Filed Weights   01/02/23 1139  Weight: 85.3 kg    Examination:  General exam: Appears calm and comfortable HEENT: No JVD CVS: S1-S2, regular rhythm Lungs: Improving air movement, still with right expiratory wheezes Abd: nondistended, soft and  nontender.Normal bowel sounds heard. Central nervous system: Alert and oriented. No focal neurological deficits. Extremities: no edema Skin: No rashes Psychiatry:  Mood & affect appropriate.     Data Reviewed:   CBC: Recent Labs  Lab 01/02/23 1150 01/02/23 1856 01/03/23 0454 01/04/23 0352  WBC 10.2 11.0* 11.2* 15.6*  NEUTROABS 5.6  --   --   --   HGB 12.5 12.4 11.5* 12.3  HCT 40.6 39.1 36.7 39.5  MCV 89.4 86.9 87.4 89.4  PLT 422* 409* 432* 461*   Basic Metabolic Panel: Recent Labs  Lab 01/02/23 1150 01/02/23 1856 01/03/23 0454 01/04/23 0352  NA 136  --  134* 134*  K 3.5  --  4.1 3.8  CL 102  --  101 99  CO2 26  --  25 26  GLUCOSE 85  --  106* 138*  BUN 14  --  11 24*  CREATININE 0.98 1.04* 0.88 1.13*  CALCIUM 8.7*  --  8.5* 8.9   GFR: Estimated Creatinine Clearance: 67.1 mL/min (A) (by C-G formula based on SCr of 1.13 mg/dL (H)). Liver Function Tests: Recent Labs  Lab 01/03/23 0454 01/04/23 0352  AST 12* 14*  ALT 10 12  ALKPHOS 84 78  BILITOT 0.4 0.5  PROT 6.9 7.3  ALBUMIN 3.0* 3.1*   No results for input(s): "LIPASE", "AMYLASE" in the last 168 hours. No results for input(s): "AMMONIA" in the last 168 hours. Coagulation Profile: No results for input(s): "INR", "PROTIME" in the last  168 hours. Cardiac Enzymes: No results for input(s): "CKTOTAL", "CKMB", "CKMBINDEX", "TROPONINI" in the last 168 hours. BNP (last 3 results) No results for input(s): "PROBNP" in the last 8760 hours. HbA1C: No results for input(s): "HGBA1C" in the last 72 hours. CBG: No results for input(s): "GLUCAP" in the last 168 hours. Lipid Profile: No results for input(s): "CHOL", "HDL", "LDLCALC", "TRIG", "CHOLHDL", "LDLDIRECT" in the last 72 hours. Thyroid Function Tests: No results for input(s): "TSH", "T4TOTAL", "FREET4", "T3FREE", "THYROIDAB" in the last 72 hours. Anemia Panel: No results for input(s): "VITAMINB12", "FOLATE", "FERRITIN", "TIBC", "IRON", "RETICCTPCT" in the  last 72 hours. Urine analysis: No results found for: "COLORURINE", "APPEARANCEUR", "LABSPEC", "PHURINE", "GLUCOSEU", "HGBUR", "BILIRUBINUR", "KETONESUR", "PROTEINUR", "UROBILINOGEN", "NITRITE", "LEUKOCYTESUR" Sepsis Labs: @LABRCNTIP (procalcitonin:4,lacticidven:4)  ) Recent Results (from the past 240 hour(s))  Resp panel by RT-PCR (RSV, Flu A&B, Covid) Anterior Nasal Swab     Status: None   Collection Time: 12/28/22  3:21 PM   Specimen: Anterior Nasal Swab  Result Value Ref Range Status   SARS Coronavirus 2 by RT PCR NEGATIVE NEGATIVE Final   Influenza A by PCR NEGATIVE NEGATIVE Final   Influenza B by PCR NEGATIVE NEGATIVE Final    Comment: (NOTE) The Xpert Xpress SARS-CoV-2/FLU/RSV plus assay is intended as an aid in the diagnosis of influenza from Nasopharyngeal swab specimens and should not be used as a sole basis for treatment. Nasal washings and aspirates are unacceptable for Xpert Xpress SARS-CoV-2/FLU/RSV testing.  Fact Sheet for Patients: BloggerCourse.com  Fact Sheet for Healthcare Providers: SeriousBroker.it  This test is not yet approved or cleared by the [place] FDA and has been authorized for detection and/or diagnosis of SARS-CoV-2 by FDA under an Emergency Use Authorization (EUA). This EUA will remain in effect (meaning this test can be used) for the duration of the COVID-19 declaration under Section 564(b)(1) of the Act, 21 U.S.C. section 360bbb-3(b)(1), unless the authorization is terminated or revoked.     Resp Syncytial Virus by PCR NEGATIVE NEGATIVE Final    Comment: (NOTE) Fact Sheet for Patients: BloggerCourse.com  Fact Sheet for Healthcare Providers: SeriousBroker.it  This test is not yet approved or cleared by the [place] FDA and has been authorized for detection and/or diagnosis of SARS-CoV-2 by FDA under an Emergency Use Authorization  (EUA). This EUA will remain in effect (meaning this test can be used) for the duration of the COVID-19 declaration under Section 564(b)(1) of the Act, 21 U.S.C. section 360bbb-3(b)(1), unless the authorization is terminated or revoked.  Performed at [facility], [address], [place], [place] [address]      Radiology Studies: ECHOCARDIOGRAM COMPLETE  Result Date: 01/03/2023    ECHOCARDIOGRAM REPORT   Patient Name:   LASHAUNE FREIMARK Date of Exam: 01/03/2023 Medical Rec #:  [id number]         Height:       69.0 in Accession #:    [id number]        Weight:       188.0 lb Date of Birth:  [birth year]          BSA:          2.012 m Patient Age:    52 years          BP:           152/90 mmHg Patient Gender: F                 HR:  75 bpm. Exam Location:  Inpatient Procedure: 2D Echo, Cardiac Doppler and Color Doppler Indications:    Dyspnea R06.00  History:        Patient has no prior history of Echocardiogram examinations.                 Signs/Symptoms:Shortness of Breath.  Sonographer:    Lucendia Herrlich Referring Phys: [phone] Magdalynn Davilla IMPRESSIONS  1. Left ventricular ejection fraction, by estimation, is 60 to 65%. The left ventricle has normal function. The left ventricle has no regional wall motion abnormalities. Left ventricular diastolic parameters were normal.  2. Right ventricular systolic function is normal. The right ventricular size is normal.  3. The mitral valve is normal in structure. No evidence of mitral valve regurgitation. No evidence of mitral stenosis.  4. The aortic valve is normal in structure. Aortic valve regurgitation is not visualized. No aortic stenosis is present.  5. The inferior vena cava is dilated in size with >50% respiratory variability, suggesting right atrial pressure of 8 mmHg. FINDINGS  Left Ventricle: Left ventricular ejection fraction, by estimation, is 60 to 65%. The left ventricle has normal function. The left ventricle has no regional wall motion  abnormalities. The left ventricular internal cavity size was normal in size. There is  no left ventricular hypertrophy. Left ventricular diastolic parameters were normal. Right Ventricle: The right ventricular size is normal. No increase in right ventricular wall thickness. Right ventricular systolic function is normal. Left Atrium: Left atrial size was normal in size. Right Atrium: Right atrial size was normal in size. Pericardium: There is no evidence of pericardial effusion. Mitral Valve: The mitral valve is normal in structure. No evidence of mitral valve regurgitation. No evidence of mitral valve stenosis. Tricuspid Valve: The tricuspid valve is normal in structure. Tricuspid valve regurgitation is trivial. No evidence of tricuspid stenosis. Aortic Valve: The aortic valve is normal in structure. Aortic valve regurgitation is not visualized. No aortic stenosis is present. Aortic valve peak gradient measures 10.0 mmHg. Pulmonic Valve: The pulmonic valve was normal in structure. Pulmonic valve regurgitation is trivial. No evidence of pulmonic stenosis. Aorta: The aortic root is normal in size and structure. Venous: The inferior vena cava is dilated in size with greater than 50% respiratory variability, suggesting right atrial pressure of 8 mmHg. IAS/Shunts: No atrial level shunt detected by color flow Doppler.  LEFT VENTRICLE PLAX 2D LVIDd:         4.30 cm   Diastology LVIDs:         2.90 cm   LV e' medial:    10.60 cm/s LV PW:         1.00 cm   LV E/e' medial:  6.2 LV IVS:        0.90 cm   LV e' lateral:   12.40 cm/s LVOT diam:     2.10 cm   LV E/e' lateral: 5.3 LV SV:         68 LV SV Index:   34 LVOT Area:     3.46 cm  RIGHT VENTRICLE             IVC RV S prime:     16.90 cm/s  IVC diam: 2.60 cm TAPSE (M-mode): 2.3 cm LEFT ATRIUM           Index        RIGHT ATRIUM          Index LA diam:      3.40 cm 1.69 cm/m  RA Area:     9.19 cm LA Vol (A2C): 50.3 ml 25.00 ml/m  RA Volume:   16.60 ml 8.25 ml/m LA Vol  (A4C): 30.8 ml 15.31 ml/m  AORTIC VALVE AV Area (Vmax): 2.54 cm AV Vmax:        158.00 cm/s AV Peak Grad:   10.0 mmHg LVOT Vmax:      116.00 cm/s LVOT Vmean:     73.600 cm/s LVOT VTI:       0.196 m  AORTA Ao Root diam: 3.50 cm Ao Asc diam:  3.20 cm MITRAL VALVE               TRICUSPID VALVE MV Area (PHT): 3.27 cm    TR Peak grad:   26.0 mmHg MV Decel Time: 232 msec    TR Vmax:        255.00 cm/s MV E velocity: 65.80 cm/s MV A velocity: 79.40 cm/s  SHUNTS MV E/A ratio:  0.83        Systemic VTI:  0.20 m                            Systemic Diam: 2.10 cm Arvilla Meres MD Electronically signed by Arvilla Meres MD Signature Date/Time: 01/03/2023/2:03:46 PM    Final      Scheduled Meds:  enoxaparin (LOVENOX) injection  40 mg Subcutaneous Q24H   guaiFENesin  600 mg Oral BID   ipratropium-albuterol  3 mL Nebulization QID   methylPREDNISolone (SOLU-MEDROL) injection  80 mg Intravenous Q12H   mometasone-formoterol  2 puff Inhalation BID   montelukast  10 mg Oral QHS   sodium chloride flush  3 mL Intravenous Q12H   Continuous Infusions:   LOS: 2 days    Time spent:    Zannie Cove, MD [facility]   01/05/2023, 10:34 AM   osed skin

## 2023-01-06 MED ORDER — SENNOSIDES-DOCUSATE SODIUM 8.6-50 MG PO TABS
1.0000 | ORAL_TABLET | Freq: Once | ORAL | Status: AC
  Administered 2023-01-06: 1 via ORAL
  Filled 2023-01-06: qty 1

## 2023-01-06 NOTE — Plan of Care (Signed)

## 2023-01-06 NOTE — Progress Notes (Signed)
PROGRESS NOTE    Janice Blake  WUJ:[id number] DOB: 1970/10/26 DOA: 01/02/2023 PCP: Pcp, No  52/F with Asthma/COPD overlap, exposed to second hand smoking at home and presented to ED with DoE for 1 week, cough and wheezing, in addition had more dyspnea for few months which she attributes to being off ICS/LABA after losing insurance. Also H/o some Orthopnea and intermittent edema which was self limiting with elevation.  -Slowly improving on IV steroids, nebs  Subjective: -Slowly improving, rough night yesterday with coughing and wheezing  Assessment and Plan:  Asthma exacerbation -Slowly improving, continue to have moderate expiratory wheezes, continue IV steroids, nebs-> changed to 4 times daily and PRN, weaned off O2 -Will request pulmonary eval tomorrow if she is still symptomatic -Dulera, Mucinex, flutter valve, -denies URI symptoms at this time -ToC consult for med assistance  Intermittent edema, history of orthopnea -sp IV Lasix X1 yesterday -Echo with preserved EF, normal RV, no diastolic dysfunction -Albumin is low normal at 3.1  Leukocytosis -Likely reactive from IV steroids, monitor  DVT prophylaxis: Lovenox Code Status: Full code Family Communication: None present Disposition Plan: Home likely 1 to 2 days  Consultants:    Procedures:   Antimicrobials:    Objective: Vitals:   01/05/23 2009 01/05/23 2045 01/05/23 2046 01/06/23 0500  BP: 129/78   135/83  Pulse: 71 77  63  Resp: 18 15  16   Temp: 97.9 F (36.6 C)   97.8 F (36.6 C)  TempSrc: Oral   Oral  SpO2: 96% 98% 98% 98%  Weight:      Height:        Intake/Output Summary (Last 24 hours) at 01/06/2023 1142 Last data filed at 01/06/2023 0920 Gross per 24 hour  Intake 243 ml  Output --  Net 243 ml   Filed Weights   01/02/23 1139  Weight: 85.3 kg    Examination:  Gen: Awake, Alert, Oriented X 3,  HEENT: no JVD Lungs: Improving air movement, still with expiratory wheezes and rhonchi CVS:  S1S2/RRR Abd: soft, Non tender, non distended, BS present Extremities: No edema Skin: no new rashes on exposed skin  Psychiatry:  Mood & affect appropriate.     Data Reviewed:   CBC: Recent Labs  Lab 01/02/23 1150 01/02/23 1856 01/03/23 0454 01/04/23 0352  WBC 10.2 11.0* 11.2* 15.6*  NEUTROABS 5.6  --   --   --   HGB 12.5 12.4 11.5* 12.3  HCT 40.6 39.1 36.7 39.5  MCV 89.4 86.9 87.4 89.4  PLT 422* 409* 432* 461*   Basic Metabolic Panel: Recent Labs  Lab 01/02/23 1150 01/02/23 1856 01/03/23 0454 01/04/23 0352  NA 136  --  134* 134*  K 3.5  --  4.1 3.8  CL 102  --  101 99  CO2 26  --  25 26  GLUCOSE 85  --  106* 138*  BUN 14  --  11 24*  CREATININE 0.98 1.04* 0.88 1.13*  CALCIUM 8.7*  --  8.5* 8.9   GFR: Estimated Creatinine Clearance: 67.1 mL/min (A) (by C-G formula based on SCr of 1.13 mg/dL (H)). Liver Function Tests: Recent Labs  Lab 01/03/23 0454 01/04/23 0352  AST 12* 14*  ALT 10 12  ALKPHOS 84 78  BILITOT 0.4 0.5  PROT 6.9 7.3  ALBUMIN 3.0* 3.1*   No results for input(s): "LIPASE", "AMYLASE" in the last 168 hours. No results for input(s): "AMMONIA" in the last 168 hours. Coagulation Profile: No results for input(s): "INR", "PROTIME"  in the last 168 hours. Cardiac Enzymes: No results for input(s): "CKTOTAL", "CKMB", "CKMBINDEX", "TROPONINI" in the last 168 hours. BNP (last 3 results) No results for input(s): "PROBNP" in the last 8760 hours. HbA1C: No results for input(s): "HGBA1C" in the last 72 hours. CBG: No results for input(s): "GLUCAP" in the last 168 hours. Lipid Profile: No results for input(s): "CHOL", "HDL", "LDLCALC", "TRIG", "CHOLHDL", "LDLDIRECT" in the last 72 hours. Thyroid Function Tests: No results for input(s): "TSH", "T4TOTAL", "FREET4", "T3FREE", "THYROIDAB" in the last 72 hours. Anemia Panel: No results for input(s): "VITAMINB12", "FOLATE", "FERRITIN", "TIBC", "IRON", "RETICCTPCT" in the last 72 hours. Urine analysis: No  results found for: "COLORURINE", "APPEARANCEUR", "LABSPEC", "PHURINE", "GLUCOSEU", "HGBUR", "BILIRUBINUR", "KETONESUR", "PROTEINUR", "UROBILINOGEN", "NITRITE", "LEUKOCYTESUR" Sepsis Labs: @LABRCNTIP (procalcitonin:4,lacticidven:4)  ) Recent Results (from the past 240 hour(s))  Resp panel by RT-PCR (RSV, Flu A&B, Covid) Anterior Nasal Swab     Status: None   Collection Time: 12/28/22  3:21 PM   Specimen: Anterior Nasal Swab  Result Value Ref Range Status   SARS Coronavirus 2 by RT PCR NEGATIVE NEGATIVE Final   Influenza A by PCR NEGATIVE NEGATIVE Final   Influenza B by PCR NEGATIVE NEGATIVE Final    Comment: (NOTE) The Xpert Xpress SARS-CoV-2/FLU/RSV plus assay is intended as an aid in the diagnosis of influenza from Nasopharyngeal swab specimens and should not be used as a sole basis for treatment. Nasal washings and aspirates are unacceptable for Xpert Xpress SARS-CoV-2/FLU/RSV testing.  Fact Sheet for Patients: BloggerCourse.com  Fact Sheet for Healthcare Providers: SeriousBroker.it  This test is not yet approved or cleared by the [place] FDA and has been authorized for detection and/or diagnosis of SARS-CoV-2 by FDA under an Emergency Use Authorization (EUA). This EUA will remain in effect (meaning this test can be used) for the duration of the COVID-19 declaration under Section 564(b)(1) of the Act, 21 U.S.C. section 360bbb-3(b)(1), unless the authorization is terminated or revoked.     Resp Syncytial Virus by PCR NEGATIVE NEGATIVE Final    Comment: (NOTE) Fact Sheet for Patients: BloggerCourse.com  Fact Sheet for Healthcare Providers: SeriousBroker.it  This test is not yet approved or cleared by the [place] FDA and has been authorized for detection and/or diagnosis of SARS-CoV-2 by FDA under an Emergency Use Authorization (EUA). This EUA will remain in effect  (meaning this test can be used) for the duration of the COVID-19 declaration under Section 564(b)(1) of the Act, 21 U.S.C. section 360bbb-3(b)(1), unless the authorization is terminated or revoked.  Performed at [facility], [address] [place], [place] [address]      Radiology Studies: No results found.   Scheduled Meds:  enoxaparin (LOVENOX) injection  40 mg Subcutaneous Q24H   guaiFENesin  600 mg Oral BID   ipratropium-albuterol  3 mL Nebulization QID   methylPREDNISolone (SOLU-MEDROL) injection  80 mg Intravenous Q12H   mometasone-formoterol  2 puff Inhalation BID   montelukast  10 mg Oral QHS   sodium chloride flush  3 mL Intravenous Q12H   Continuous Infusions:   LOS: 3 days    Time spent:    Zannie Cove, MD [facility]   01/06/2023, 11:42 AM   osed skin

## 2023-01-06 NOTE — Progress Notes (Signed)
Mobility Specialist: Progress Note   01/06/23 1606  Mobility  Activity Ambulated independently in hallway  Level of Assistance Independent  Assistive Device None  Distance Ambulated (ft) 550 ft  Activity Response Tolerated well  Mobility Referral Yes  $Mobility charge 1 Mobility  Mobility Specialist Start Time (ACUTE ONLY) 0947  Mobility Specialist Stop Time (ACUTE ONLY) 0953  Mobility Specialist Time Calculation (min) (ACUTE ONLY) 6 min    During Mobility: SpO2 89-91% RA  Received pt in bed having no complaints and agreeable to mobility. Pt was asymptomatic throughout ambulation and returned to room w/o fault. Left in bed w/ call bell in reach and all needs met.  Maurene Capes Mobility Specialist Please contact via SecureChat or Rehab office at [phone]

## 2023-01-07 ENCOUNTER — Inpatient Hospital Stay (HOSPITAL_COMMUNITY): Payer: Medicaid Other

## 2023-01-07 LAB — CBC
HCT: 41.7 % (ref 36.0–46.0)
Hemoglobin: 13 g/dL (ref 12.0–15.0)
MCH: 27.7 pg (ref 26.0–34.0)
MCHC: 31.2 g/dL (ref 30.0–36.0)
MCV: 88.9 fL (ref 80.0–100.0)
Platelets: 448 10*3/uL — ABNORMAL HIGH (ref 150–400)
RBC: 4.69 MIL/uL (ref 3.87–5.11)
RDW: 16.1 % — ABNORMAL HIGH (ref 11.5–15.5)
WBC: 17.2 10*3/uL — ABNORMAL HIGH (ref 4.0–10.5)
nRBC: 0 % (ref 0.0–0.2)

## 2023-01-07 LAB — BASIC METABOLIC PANEL
Anion gap: 9 (ref 5–15)
BUN: 28 mg/dL — ABNORMAL HIGH (ref 6–20)
CO2: 22 mmol/L (ref 22–32)
Calcium: 8.7 mg/dL — ABNORMAL LOW (ref 8.9–10.3)
Chloride: 101 mmol/L (ref 98–111)
Creatinine, Ser: 1.01 mg/dL — ABNORMAL HIGH (ref 0.44–1.00)
GFR, Estimated: >60 mL/min (ref >60)
Glucose, Bld: 147 mg/dL — ABNORMAL HIGH (ref 70–99)
Potassium: 4 mmol/L (ref 3.5–5.1)
Sodium: 132 mmol/L — ABNORMAL LOW (ref 135–145)

## 2023-01-07 LAB — SEDIMENTATION RATE: Sed Rate: 5 mm/h (ref 0–22)

## 2023-01-07 MED ORDER — METHYLPREDNISOLONE SODIUM SUCC 125 MG IJ SOLR
60.0000 mg | Freq: Two times a day (BID) | INTRAMUSCULAR | Status: DC
  Administered 2023-01-07 – 2023-01-10 (×6): 60 mg via INTRAVENOUS
  Filled 2023-01-07 (×6): qty 2

## 2023-01-07 MED ORDER — REVEFENACIN 175 MCG/3ML IN SOLN
175.0000 ug | Freq: Every day | RESPIRATORY_TRACT | Status: DC
  Administered 2023-01-08 – 2023-01-12 (×4): 175 ug via RESPIRATORY_TRACT
  Filled 2023-01-07 (×7): qty 3

## 2023-01-07 MED ORDER — FUROSEMIDE 10 MG/ML IJ SOLN
20.0000 mg | Freq: Once | INTRAMUSCULAR | Status: AC
  Administered 2023-01-07: 20 mg via INTRAVENOUS
  Filled 2023-01-07: qty 2

## 2023-01-07 MED ORDER — AZITHROMYCIN 250 MG PO TABS
500.0000 mg | ORAL_TABLET | Freq: Every day | ORAL | Status: DC
  Administered 2023-01-07 – 2023-01-12 (×6): 500 mg via ORAL
  Filled 2023-01-07 (×6): qty 2

## 2023-01-07 MED ORDER — SALINE SPRAY 0.65 % NA SOLN
1.0000 | Freq: Two times a day (BID) | NASAL | Status: DC
  Administered 2023-01-07 – 2023-01-12 (×10): 1 via NASAL
  Filled 2023-01-07: qty 44

## 2023-01-07 MED ORDER — HYDROXYZINE HCL 10 MG PO TABS
10.0000 mg | ORAL_TABLET | Freq: Three times a day (TID) | ORAL | Status: DC | PRN
  Administered 2023-01-07: 10 mg via ORAL
  Filled 2023-01-07: qty 1

## 2023-01-07 MED ORDER — ALBUTEROL SULFATE (2.5 MG/3ML) 0.083% IN NEBU
2.5000 mg | INHALATION_SOLUTION | RESPIRATORY_TRACT | Status: DC | PRN
  Administered 2023-01-07 – 2023-01-11 (×3): 2.5 mg via RESPIRATORY_TRACT
  Filled 2023-01-07 (×3): qty 3

## 2023-01-07 MED ORDER — AZELASTINE HCL 0.1 % NA SOLN
1.0000 | Freq: Every morning | NASAL | Status: DC
  Administered 2023-01-08 – 2023-01-12 (×5): 1 via NASAL
  Filled 2023-01-07: qty 30

## 2023-01-07 MED ORDER — BUDESONIDE 0.5 MG/2ML IN SUSP
0.5000 mg | Freq: Two times a day (BID) | RESPIRATORY_TRACT | Status: DC
  Administered 2023-01-07 – 2023-01-12 (×11): 0.5 mg via RESPIRATORY_TRACT
  Filled 2023-01-07 (×11): qty 2

## 2023-01-07 MED ORDER — FLUTICASONE PROPIONATE 50 MCG/ACT NA SUSP
1.0000 | Freq: Every day | NASAL | Status: DC
  Administered 2023-01-07 – 2023-01-11 (×5): 1 via NASAL
  Filled 2023-01-07: qty 16

## 2023-01-07 MED ORDER — ARFORMOTEROL TARTRATE 15 MCG/2ML IN NEBU
15.0000 ug | INHALATION_SOLUTION | Freq: Two times a day (BID) | RESPIRATORY_TRACT | Status: DC
  Administered 2023-01-07 – 2023-01-12 (×11): 15 ug via RESPIRATORY_TRACT
  Filled 2023-01-07 (×11): qty 2

## 2023-01-07 NOTE — Consult Note (Signed)
NAME:  Janice Blake, MRN:  [id number], DOB:  Jun 29, 1970, LOS: 4 ADMISSION DATE:  01/02/2023, CONSULTATION DATE: 01/07/23 REFERRING MD:  Joya Martyr, CHIEF COMPLAINT:  asthma   History of Present Illness:  Janice Blake is a 52 y.o. F with PMH significant for moderate, persistent asthma, chronic rhinitis, prurigo nodularis who presented 9/11 with worsening shortness of breath and wheezing consistent with asthma exacerbation after running out of her home Breztri and Albuterol 6-8 weeks ago after losing her insurance.  She also had family move in with her who smoke heavily, so has been exposed to second hand smoke.  She has not had LE edema, fevers, chills or worsening rhinitis.  Did notice new onset productive cough.   She was admitted required [place] oxygen, treated with steroids, nebulizers, lasix and underwent an echo.  Covid and flu were negative  Since admission, she has been weaned off oxygen and overall is feeling improved, but has persistent wheezing and dyspnea with exertion despite the above treatments.  PCCM was consulted in this setting.   Pertinent  Medical History    has a past medical history of Anemia, Asthma, Cotton-dust asthma (HCC), Fracture of right tibia, Nasal polyps, and Shortness of breath.   Significant Hospital Events: Including procedures, antibiotic start and stop dates in addition to other pertinent events   9/11 admitted to [facility] with asthma exacerbation  Interim History / Subjective:  As above   Objective   Blood pressure (!) 151/98, pulse 90, temperature (!) 97.5 F (36.4 C), temperature source Oral, resp. rate 20, height 5\' 9"  (1.753 m), weight 85.3 kg, SpO2 99%.        Intake/Output Summary (Last 24 hours) at 01/07/2023 1309 Last data filed at 01/06/2023 2100 Gross per 24 hour  Intake 240 ml  Output --  Net 240 ml   Filed Weights   01/02/23 1139  Weight: 85.3 kg   General:  well nourished F, sitting up in bed without distress HEENT: MM  pink/moist Neuro: alert and oriented, appropriately conversational CV: s1s2 rrr, no m/r/g PULM:  bilateral expiratory wheezing in all lung fields, no rhonchi, tachypnea or accessory muscle use on room air GI: soft, non-tender  Extremities: warm/dry, no edema  Skin: no rashes or lesions  Resolved Hospital Problem list     Assessment & Plan:    Acute Asthma exacerbation Follows with [facility] Last PFT's done 05/2022 and consistent with obstructive disease Has been slowly improving, though remains dyspneic with exertion and wheezing on exam -last CXR was 9/6, will repeat -add azithromycin for anti-inflammatory effect in the setting of recent tobacco use  -continue brovana, yupelri and pulmicort along with singulair and solumedrol -suspect her recovery may be prolonged given her lack of home medications and the addition of smoke exposure in her home, Dr. Craige Cotta to see  -thank you for this consult, please reach out to PCCM for any further concerns -rest of plan per primary      Best Practice (right click and "Reselect all SmartList Selections" daily)   Per primary  Labs   CBC: Recent Labs  Lab 01/02/23 1150 01/02/23 1856 01/03/23 0454 01/04/23 0352 01/07/23 0432  WBC 10.2 11.0* 11.2* 15.6* 17.2*  NEUTROABS 5.6  --   --   --   --   HGB 12.5 12.4 11.5* 12.3 13.0  HCT 40.6 39.1 36.7 39.5 41.7  MCV 89.4 86.9 87.4 89.4 88.9  PLT 422* 409* 432* 461* 448*    Basic  Metabolic Panel: Recent Labs  Lab 01/02/23 1150 01/02/23 1856 01/03/23 0454 01/04/23 0352 01/07/23 0432  NA 136  --  134* 134* 132*  K 3.5  --  4.1 3.8 4.0  CL 102  --  101 99 101  CO2 26  --  25 26 22   GLUCOSE 85  --  106* 138* 147*  BUN 14  --  11 24* 28*  CREATININE 0.98 1.04* 0.88 1.13* 1.01*  CALCIUM 8.7*  --  8.5* 8.9 8.7*   GFR: Estimated Creatinine Clearance: 75 mL/min (A) (by C-G formula based on SCr of 1.01 mg/dL (H)). Recent Labs  Lab 01/02/23 1856 01/03/23 0454  01/04/23 0352 01/07/23 0432  WBC 11.0* 11.2* 15.6* 17.2*    Liver Function Tests: Recent Labs  Lab 01/03/23 0454 01/04/23 0352  AST 12* 14*  ALT 10 12  ALKPHOS 84 78  BILITOT 0.4 0.5  PROT 6.9 7.3  ALBUMIN 3.0* 3.1*   No results for input(s): "LIPASE", "AMYLASE" in the last 168 hours. No results for input(s): "AMMONIA" in the last 168 hours.  ABG No results found for: "PHART", "PCO2ART", "PO2ART", "HCO3", "TCO2", "ACIDBASEDEF", "O2SAT"   Coagulation Profile: No results for input(s): "INR", "PROTIME" in the last 168 hours.  Cardiac Enzymes: No results for input(s): "CKTOTAL", "CKMB", "CKMBINDEX", "TROPONINI" in the last 168 hours.  HbA1C: No results found for: "HGBA1C"  CBG: No results for input(s): "GLUCAP" in the last 168 hours.  Review of Systems:   Please see the history of present illness. All other systems reviewed and are negative    Past Medical History:  She,  has a past medical history of Anemia, Asthma, Cotton-dust asthma (HCC), Fracture of right tibia, Nasal polyps, and Shortness of breath.   Surgical History:   Past Surgical History:  Procedure Laterality Date   FRACTURE SURGERY     NASAL POLYP SURGERY Bilateral 2011   NASAL SINUS SURGERY Bilateral 11/03/2014   Procedure: BILATERAL ENDOSCOPIC POLYPECTOMY ETHMOIDECTOMY MAXILLARY ANTROSTOMY ;  Surgeon: Serena Colonel, MD;  Location: [facility];  Service: ENT;  Laterality: Bilateral;   PATELLA FRACTURE SURGERY Right 1996   "crushed; rod w/12 screws on one side, 6 screws on the other"   TUBAL LIGATION  1994     Social History:   reports that she has never smoked. She has never used smokeless tobacco. She reports current alcohol use. She reports that she does not use drugs.   Family History:  Her family history includes Allergic rhinitis in her mother; Asthma in her father and son; Cancer in her paternal grandfather and paternal grandmother; Diabetes in her maternal grandmother;  Hypertension in her father and maternal grandfather.   Allergies Allergies  Allergen Reactions   Amoxil [Amoxicillin] Hives and Itching   Levaquin [Levofloxacin] Hives and Itching     Home Medications  Prior to Admission medications   Medication Sig Start Date End Date Taking? Authorizing Provider  albuterol (PROVENTIL) (2.5 MG/3ML) 0.083% nebulizer solution Take 3 mLs (2.5 mg total) by nebulization every 6 (six) hours as needed for wheezing or shortness of breath. 12/28/22  Yes Eber Hong, MD  albuterol (VENTOLIN HFA) 108 (90 Base) MCG/ACT inhaler Inhale 2 puffs into the lungs every 6 (six) hours as needed for wheezing or shortness of breath. 09/25/22  Yes Marcine Matar, MD  cetirizine (ZYRTEC) 10 MG tablet Take 10 mg by mouth daily.   Yes [provider]  montelukast (SINGULAIR) 10 MG tablet Take 1 tablet (10 mg total)  by mouth at bedtime. 12/28/22  Yes Eber Hong, MD  Budeson-Glycopyrrol-Formoterol (BREZTRI AEROSPHERE) 160-9-4.8 MCG/ACT AERO Inhale 2 puffs into the lungs 2 (two) times daily. Patient not taking: Reported on 01/02/2023 09/25/22   Marcine Matar, MD     Critical care time: n/a    Darcella Gasman Vicent Febles, PA-C [facility] See Amion for pager If no response to pager , please call [phone] until 7pm After 7:00 pm call Elink  [phone]

## 2023-01-07 NOTE — Progress Notes (Signed)
   01/07/23 1029  Mobility  Activity Ambulated independently in hallway  Level of Assistance Independent  Assistive Device None  Distance Ambulated (ft) 550 ft  Activity Response Tolerated well  Mobility Referral Yes  $Mobility charge 1 Mobility  Mobility Specialist Start Time (ACUTE ONLY) 1020  Mobility Specialist Stop Time (ACUTE ONLY) 1028  Mobility Specialist Time Calculation (min) (ACUTE ONLY) 8 min   Mobility Specialist: Progress Note  During Mobility: SpO2 90-92% RA  Pt agreeable to mobility session - received standing in room. Ambulated independently with c/o having a bad night and wheezing around 3am, otherwise asymptomatic throughout. VSS. Pt returned standing in room with all needs met - call bell within reach.   Barnie Mort, BS Mobility Specialist Please contact via SecureChat or Rehab office at [phone].

## 2023-01-07 NOTE — Progress Notes (Addendum)
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 Also reported mild orthopnea and intermittent edema which was self limiting with elevation prior to admission.  -Very slow improvement on IV steroids, nebs  Subjective: -Thinks she is making some progress, continues to have cough and wheezing  Assessment and Plan:  Asthma exacerbation -Unfortunately continues to be very slow to improve, persistent expiratory wheezes despite 4 days of IV steroids, nebs etc. -Continue IV Solu-Medrol, DuoNeb, Pulmicort, Brovana, Singulair, flutter valve -Will request pulmonary eval -Check ESR, high-sensitivity CRP -Weaned off O2, ambulating in the halls now  Intermittent edema, history of orthopnea -sp IV Lasix X1 on admission -Echo with preserved EF, normal RV, no diastolic dysfunction -Albumin is low normal at 3.1 -Clinically appears euvolemic,  will repeat Lasix to keep net negative  Leukocytosis, thrombocytosis -Likely reactive from IV steroids, monitor  DVT prophylaxis: Lovenox Code Status: Full code Family Communication: None present Disposition Plan: Home pending clinical improvement  Consultants: Pulmonary 9/16   Procedures:   Antimicrobials:    Objective: Vitals:   01/07/23 0401 01/07/23 0756 01/07/23 0853 01/07/23 0855  BP: 133/88 (!) 151/98    Pulse: 71 77 90   Resp: 20  20   Temp: 98.3 F (36.8 C) (!) 97.5 F (36.4 C)    TempSrc:  Oral    SpO2: 97% 96% 95% 95%  Weight:      Height:        Intake/Output Summary (Last 24 hours) at 01/07/2023 1117 Last data filed at 01/06/2023 2100 Gross per 24 hour  Intake 240 ml  Output --  Net 240 ml   Filed Weights   01/02/23 1139  Weight: 85.3  kg    Examination:  Gen: Pleasant female sitting up in bed, AAOx3 HEENT: No JVD CVS: S1-S2, regular rhythm, tachycardic, lung Lungs: Improving air movement, still continues to have expiratory wheezes and some rhonchi  Abd: soft, Non tender, non distended, BS present Extremities: No edema Skin: no new rashes on exposed skin  Psychiatry:  Mood & affect appropriate.     Data Reviewed:   CBC: Recent Labs  Lab 01/02/23 1150 01/02/23 1856 01/03/23 0454 01/04/23 0352 01/07/23 0432  WBC 10.2 11.0* 11.2* 15.6* 17.2*  NEUTROABS 5.6  --   --   --   --   HGB 12.5 12.4 11.5* 12.3 13.0  HCT 40.6 39.1 36.7 39.5 41.7  MCV 89.4 86.9 87.4 89.4 88.9  PLT 422* 409* 432* 461* 448*   Basic Metabolic Panel: Recent Labs  Lab 01/02/23 1150 01/02/23 1856 01/03/23 0454 01/04/23 0352 01/07/23 0432  NA 136  --  134* 134* 132*  K 3.5  --  4.1 3.8 4.0  CL 102  --  101 99 101  CO2 26  --  25 26 22   GLUCOSE 85  --  106* 138* 147*  BUN 14  --  11 24* 28*  CREATININE 0.98 1.04* 0.88 1.13* 1.01*  CALCIUM 8.7*  --  8.5* 8.9 8.7*   GFR: Estimated Creatinine Clearance: 75 mL/min (A) (by C-G formula based on SCr of 1.01 mg/dL (H)). Liver Function Tests: Recent Labs  Lab 01/03/23 0454 01/04/23 0352  AST 12* 14*  ALT 10 12  ALKPHOS 84 78  BILITOT 0.4 0.5  PROT 6.9 7.3  ALBUMIN 3.0* 3.1*   No results for input(s): "LIPASE", "AMYLASE" in the last 168 hours. No results for input(s): "AMMONIA" in the last 168 hours. Coagulation Profile: No results for input(s): "INR", "PROTIME" in the last 168 hours. Cardiac Enzymes: No results for input(s): "CKTOTAL", "CKMB", "CKMBINDEX", "TROPONINI" in the last 168 hours. BNP (last 3 results) No results for input(s): "PROBNP" in the last 8760 hours. HbA1C: No results for input(s): "HGBA1C" in the last 72 hours. CBG: No results for input(s): "GLUCAP" in the last 168 hours. Lipid Profile: No results for input(s): "CHOL", "HDL", "LDLCALC", "TRIG",  "CHOLHDL", "LDLDIRECT" in the last 72 hours. Thyroid Function Tests: No results for input(s): "TSH", "T4TOTAL", "FREET4", "T3FREE", "THYROIDAB" in the last 72 hours. Anemia Panel: No results for input(s): "VITAMINB12", "FOLATE", "FERRITIN", "TIBC", "IRON", "RETICCTPCT" in the last 72 hours. Urine analysis: No results found for: "COLORURINE", "APPEARANCEUR", "LABSPEC", "PHURINE", "GLUCOSEU", "HGBUR", "BILIRUBINUR", "KETONESUR", "PROTEINUR", "UROBILINOGEN", "NITRITE", "LEUKOCYTESUR" Sepsis Labs: @LABRCNTIP (procalcitonin:4,lacticidven:4)  ) Recent Results (from the past 240 hour(s))  Resp panel by RT-PCR (RSV, Flu A&B, Covid) Anterior Nasal Swab     Status: None   Collection Time: 12/28/22  3:21 PM   Specimen: Anterior Nasal Swab  Result Value Ref Range Status   SARS Coronavirus 2 by RT PCR NEGATIVE NEGATIVE Final   Influenza A by PCR NEGATIVE NEGATIVE Final   Influenza B by PCR NEGATIVE NEGATIVE Final    Comment: (NOTE) The Xpert Xpress SARS-CoV-2/FLU/RSV plus assay is intended as an aid in the diagnosis of influenza from Nasopharyngeal swab specimens and should not be used as a sole basis for treatment. Nasal washings and aspirates are unacceptable for Xpert Xpress SARS-CoV-2/FLU/RSV testing.  Fact Sheet for Patients: BloggerCourse.com  Fact Sheet for Healthcare Providers: SeriousBroker.it  This test is not yet approved or cleared by the [place] FDA and has been authorized for detection and/or diagnosis of SARS-CoV-2 by FDA under an Emergency Use Authorization (EUA). This EUA will remain in effect (meaning this test can be used) for the duration of the COVID-19 declaration under Section 564(b)(1) of the Act, 21 U.S.C. section 360bbb-3(b)(1), unless the authorization is terminated or revoked.     Resp Syncytial Virus by PCR NEGATIVE NEGATIVE Final    Comment: (NOTE) Fact Sheet for  Patients: BloggerCourse.com  Fact Sheet for Healthcare Providers: SeriousBroker.it  This test is not yet approved or cleared by the [place] FDA and has been authorized for detection and/or diagnosis of SARS-CoV-2 by FDA under an Emergency Use Authorization (EUA). This EUA will remain in effect (meaning this test can be used) for the duration of the COVID-19 declaration under Section 564(b)(1) of the Act, 21 U.S.C. section 360bbb-3(b)(1), unless the authorization is terminated or revoked.  Performed at [facility], [address], [place], [place] [address]      Radiology Studies: No results found.   Scheduled Meds:  arformoterol  15 mcg Nebulization BID   budesonide (PULMICORT) nebulizer solution  0.5 mg Nebulization BID   enoxaparin (LOVENOX) injection  40 mg Subcutaneous Q24H   guaiFENesin  600 mg Oral BID   ipratropium-albuterol  3 mL Nebulization QID   methylPREDNISolone (SOLU-MEDROL) injection  60 mg Intravenous Q12H   montelukast  10 mg Oral QHS   revefenacin  175 mcg Nebulization Daily   sodium chloride flush  3 mL Intravenous Q12H   Continuous Infusions:   LOS: 4 days    Time spent:  Zannie Cove, MD [facility]   01/07/2023, 11:17 AM   osed skin

## 2023-01-08 DIAGNOSIS — J4551 Severe persistent asthma with (acute) exacerbation: Secondary | ICD-10-CM

## 2023-01-08 LAB — BASIC METABOLIC PANEL
Anion gap: 9 (ref 5–15)
BUN: 33 mg/dL — ABNORMAL HIGH (ref 6–20)
CO2: 21 mmol/L — ABNORMAL LOW (ref 22–32)
Calcium: 8.7 mg/dL — ABNORMAL LOW (ref 8.9–10.3)
Chloride: 101 mmol/L (ref 98–111)
Creatinine, Ser: 1.02 mg/dL — ABNORMAL HIGH (ref 0.44–1.00)
GFR, Estimated: >60 mL/min (ref >60)
Glucose, Bld: 171 mg/dL — ABNORMAL HIGH (ref 70–99)
Potassium: 4 mmol/L (ref 3.5–5.1)
Sodium: 131 mmol/L — ABNORMAL LOW (ref 135–145)

## 2023-01-08 NOTE — Progress Notes (Signed)
Mobility Specialist: Progress Note   01/08/23 1619  Mobility  Activity Ambulated independently in hallway  Level of Assistance Independent  Assistive Device None  Distance Ambulated (ft) 550 ft  Activity Response Tolerated well  Mobility Referral Yes  $Mobility charge 1 Mobility  Mobility Specialist Start Time (ACUTE ONLY) 0949  Mobility Specialist Stop Time (ACUTE ONLY) 0956  Mobility Specialist Time Calculation (min) (ACUTE ONLY) 7 min    During Mobility: SpO2 89-90% RA  Received pt in ambulating in room having no complaints and agreeable to mobility. Pt was asymptomatic throughout ambulation and returned to room w/o fault. Left ambulating in room w/ call bell in reach and all needs met.  Maurene Capes Mobility Specialist Please contact via SecureChat or Rehab office at [phone]

## 2023-01-08 NOTE — Consult Note (Signed)
NAME:  Janice Blake, MRN:  [id number], DOB:  08/28/1970, LOS: 5 ADMISSION DATE:  01/02/2023, CONSULTATION DATE: 01/08/23 REFERRING MD:  Joya Martyr, CHIEF COMPLAINT:  asthma   History of Present Illness:  Janice Blake is a 52 y.o. F with PMH significant for moderate, persistent asthma, chronic rhinitis, prurigo nodularis who presented 9/11 with worsening shortness of breath and wheezing consistent with asthma exacerbation after running out of her home Breztri and Albuterol 6-8 weeks ago after losing her insurance.  She also had family move in with her who smoke heavily, so has been exposed to second hand smoke.  She has not had LE edema, fevers, chills or worsening rhinitis.  Did notice new onset productive cough.   She was admitted required [place] oxygen, treated with steroids, nebulizers, lasix and underwent an echo.  Covid and flu were negative  Since admission, she has been weaned off oxygen and overall is feeling improved, but has persistent wheezing and dyspnea with exertion despite the above treatments.  PCCM was consulted in this setting.   Pertinent  Medical History  Asthma, Chronic rhinitis, Nasal polyposis, Prurigo nodularis  Significant Hospital Events: Including procedures, antibiotic start and stop dates in addition to other pertinent events   9/11 admitted to [facility] with asthma exacerbation  Interim History / Subjective:  Needed an extra nebulizer treatment this morning.  Feels like she is improving otherwise, but still has cough and wheeze.  Has been trying to walk in her room and hall as much as possible.  Sinus congestion better.  Objective   Blood pressure (!) 134/90, pulse 74, temperature (!) 97.5 F (36.4 C), temperature source Oral, resp. rate 16, height 5\' 9"  (1.753 m), weight 85.3 kg, SpO2 95%.        Intake/Output Summary (Last 24 hours) at 01/08/2023 1010 Last data filed at 01/08/2023 0900 Gross per 24 hour  Intake 720 ml  Output 2 ml  Net 718 ml   Filed Weights    01/02/23 1139  Weight: 85.3 kg    General - alert Eyes - pupils reactive ENT - no sinus tenderness, no stridor, raspy voice Cardiac - regular rate/rhythm, no murmur Chest - bilateral wheezing Abdomen - soft, non tender, + bowel sounds Extremities - no cyanosis, clubbing, or edema Skin - no rashes Neuro - normal strength, moves extremities, follows commands Psych - normal mood and behavior   Assessment & Plan:   Acute asthma exacerbation likely from environmental second hand tobacco exposure. 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 - Allergy test 03/23/21 >> negative - A1AT 03/23/21 >> 197 - Aspergillus precipitins 03/23/21 >> negative - RAST 03/24/23 >> Negative; IgE 17 - Spirometry 06/19/22 >> FEV1 1.21 (44%), FEV1% 57 - CBC 01/02/23 >> Eosinophils 0.9 K/uL Plan: - previously followed by allergy and asthma - was recommended to consider biologic agent as an outpt, but she is reluctant to do this - continue pulmicort, brovana, yupelri, singulair - continue solumedrol - day 2 of zithromax - prn albuterol   Chronic rhinitis with nasal polyposis. Plan: - continue zyrtec, azelastine, flonase, nasal irrigation   Prurigo nodularis. Plan: - per primary team   Social determinants of health. - she recently lost her job and with that her health insurance Plan: - TOC  Labs       Latest Ref Rng & Units 01/08/2023    4:35 AM 01/07/2023    4:32 AM 01/04/2023    3:52 AM  CMP  Glucose 70 -  99 mg/dL 454  098  119   BUN 6 - 20 mg/dL 33  28  24   Creatinine 0.44 - 1.00 mg/dL 1.47  8.29  5.62   Sodium 135 - 145 mmol/L 131  132  134   Potassium 3.5 - 5.1 mmol/L 4.0  4.0  3.8   Chloride 98 - 111 mmol/L 101  101  99   CO2 22 - 32 mmol/L 21  22  26    Calcium 8.9 - 10.3 mg/dL 8.7  8.7  8.9   Total Protein 6.5 - 8.1 g/dL   7.3   Total Bilirubin 0.3 - 1.2 mg/dL   0.5   Alkaline Phos 38 - 126 U/L   78   AST 15 - 41 U/L   14    ALT 0 - 44 U/L   12       Latest Ref Rng & Units 01/07/2023    4:32 AM 01/04/2023    3:52 AM 01/03/2023    4:54 AM  CBC  WBC 4.0 - 10.5 K/uL 17.2  15.6  11.2   Hemoglobin 12.0 - 15.0 g/dL 13.0  86.5  78.4   Hematocrit 36.0 - 46.0 % 41.7  39.5  36.7   Platelets 150 - 400 K/uL 448  461  432     Signature:  Coralyn Helling, MD [facility] Pager - [phone] or [phone] 01/08/2023, 10:15 AM

## 2023-01-08 NOTE — Progress Notes (Signed)
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 Also reported mild orthopnea and intermittent edema which was self limiting with elevation prior to admission.  -Very slow improvement on IV steroids, nebs -Pulmonary consulted  Subjective: -Feels like she is slowly improving, acute dyspnea at 5 AM, improved after nebs  Assessment and Plan:  Asthma exacerbation Severe persistent asthma with eosinophilic phenotype -Unfortunately continues to be very slow to improve despite maximal medical therapy -Continue IV Solu-Medrol, DuoNeb, Pulmicort, Brovana, Singulair, flutter valve -Appreciate pulmonary input, started on azithromycin yesterday  Intermittent edema, history of orthopnea -sp IV Lasix X1 on admission -Echo with preserved EF, normal RV, no diastolic dysfunction -Albumin is low normal at 3.1 -Now euvolemic  Leukocytosis, thrombocytosis -Likely reactive from IV steroids, monitor  Prurigo nodularis -Supportive care, recommended anti-inflammatory, elimination diet   DVT prophylaxis: Lovenox Code Status: Full code Family Communication: None present Disposition Plan: Home pending clinical improvement  Consultants: Pulmonary 9/16   Procedures:   Antimicrobials:    Objective: Vitals:   01/08/23 0442 01/08/23 0558 01/08/23 0736 01/08/23 0848  BP: (!) 140/83  (!) 134/90   Pulse: 73  71 74  Resp: 15   16  Temp: 98.3 F (36.8 C)  (!) 97.5 F (36.4 C)   TempSrc: Oral  Oral   SpO2: 98% 97% 91% 95%  Weight:      Height:        Intake/Output Summary (Last 24 hours) at 01/08/2023 1155 Last data filed at 01/08/2023 0900 Gross per 24 hour  Intake 720 ml  Output 2 ml  Net 718 ml   Filed  Weights   01/02/23 1139  Weight: 85.3 kg    Examination:  Gen: Pleasant female sitting up in bed, AAOx3 HEENT: No JVD CVS: S1-S2, regular rhythm, tachycardic, lung Lungs: Improving air movement, still continues to have expiratory wheezes and some rhonchi  Abd: soft, Non tender, non distended, BS present Extremities: No edema Skin: no new rashes on exposed skin  Psychiatry:  Mood & affect appropriate.     Data Reviewed:   CBC: Recent Labs  Lab 01/02/23 1150 01/02/23 1856 01/03/23 0454 01/04/23 0352 01/07/23 0432  WBC 10.2 11.0* 11.2* 15.6* 17.2*  NEUTROABS 5.6  --   --   --   --   HGB 12.5 12.4 11.5* 12.3 13.0  HCT 40.6 39.1 36.7 39.5 41.7  MCV 89.4 86.9 87.4 89.4 88.9  PLT 422* 409* 432* 461* 448*   Basic Metabolic Panel: Recent Labs  Lab 01/02/23 1150 01/02/23 1856 01/03/23 0454 01/04/23 0352 01/07/23 0432 01/08/23 0435  NA 136  --  134* 134* 132* 131*  K 3.5  --  4.1 3.8 4.0 4.0  CL 102  --  101 99 101 101  CO2 26  --  25 26 22  21*  GLUCOSE 85  --  106* 138* 147* 171*  BUN 14  --  11 24* 28* 33*  CREATININE 0.98 1.04* 0.88 1.13* 1.01* 1.02*  CALCIUM 8.7*  --  8.5* 8.9 8.7* 8.7*   GFR: Estimated Creatinine Clearance: 74.3 mL/min (A) (by C-G formula based on SCr of 1.02 mg/dL (H)). Liver Function Tests: Recent Labs  Lab 01/03/23 0454 01/04/23 0352  AST 12* 14*  ALT 10 12  ALKPHOS 84 78  BILITOT 0.4 0.5  PROT 6.9 7.3  ALBUMIN 3.0* 3.1*   No results for input(s): "LIPASE", "AMYLASE" in the last 168 hours. No results for input(s): "AMMONIA" in the last 168 hours. Coagulation Profile: No results for input(s): "INR", "PROTIME" in the last 168 hours. Cardiac Enzymes: No results for input(s): "CKTOTAL", "CKMB", "CKMBINDEX", "TROPONINI" in the last 168 hours. BNP (last 3 results) No results for input(s): "PROBNP" in the last 8760 hours. HbA1C: No results for input(s): "HGBA1C" in the last 72 hours. CBG: No results for input(s): "GLUCAP" in the  last 168 hours. Lipid Profile: No results for input(s): "CHOL", "HDL", "LDLCALC", "TRIG", "CHOLHDL", "LDLDIRECT" in the last 72 hours. Thyroid Function Tests: No results for input(s): "TSH", "T4TOTAL", "FREET4", "T3FREE", "THYROIDAB" in the last 72 hours. Anemia Panel: No results for input(s): "VITAMINB12", "FOLATE", "FERRITIN", "TIBC", "IRON", "RETICCTPCT" in the last 72 hours. Urine analysis: No results found for: "COLORURINE", "APPEARANCEUR", "LABSPEC", "PHURINE", "GLUCOSEU", "HGBUR", "BILIRUBINUR", "KETONESUR", "PROTEINUR", "UROBILINOGEN", "NITRITE", "LEUKOCYTESUR" Sepsis Labs: @LABRCNTIP (procalcitonin:4,lacticidven:4)  ) No results found for this or any previous visit (from the past 240 hour(s)).    Radiology Studies: DG CHEST PORT 1 VIEW  Result Date: 01/07/2023 CLINICAL DATA:  Wheezing EXAM: PORTABLE CHEST 1 VIEW COMPARISON:  12/28/2022 FINDINGS: Lungs are clear.  No pleural effusion or pneumothorax. Heart is normal in size. IMPRESSION: No acute cardiopulmonary disease. Electronically Signed   By: Charline Bills M.D.   On: 01/07/2023 17:51     Scheduled Meds:  arformoterol  15 mcg Nebulization BID   azelastine  1 spray Each Nare q morning   azithromycin  500 mg Oral Daily   budesonide (PULMICORT) nebulizer solution  0.5 mg Nebulization BID   enoxaparin (LOVENOX) injection  40 mg Subcutaneous Q24H   fluticasone  1 spray Each Nare QHS   guaiFENesin  600 mg Oral BID   methylPREDNISolone (SOLU-MEDROL) injection  60 mg Intravenous Q12H   montelukast  10 mg Oral QHS   revefenacin  175 mcg Nebulization Daily   sodium chloride  1 spray Each Nare BID   sodium chloride flush  3 mL Intravenous Q12H   Continuous Infusions:   LOS: 5 days    Time spent:    Zannie Cove, MD [facility]   01/08/2023, 11:55 AM   osed skin

## 2023-01-09 DIAGNOSIS — D72829 Elevated white blood cell count, unspecified: Secondary | ICD-10-CM

## 2023-01-09 NOTE — Progress Notes (Signed)
[facility]                                                                               Janice Blake, is a 52 y.o. female, DOB - April 21, 1971, WUJ:[id number] Admit date - 01/02/2023    Outpatient Primary MD for the patient is Pcp, No  LOS - 6  days    Brief summary   52/F with Asthma/COPD overlap, exposed to second hand smoking at home and presented to ED with DoE for 1 week, cough and wheezing, in addition had more dyspnea for few months which she attributes to being off ICS/LABA after losing insurance. Also reported mild orthopnea and intermittent edema which was self limiting with elevation prior to admission.  -Very slow improvement on IV steroids, nebs -Pulmonary consulted   Assessment & Plan    Assessment and Plan:     Asthma exacerbation Severe persistent asthma with eosinophilic phenotype -Unfortunately continues to be very slow to improve despite maximal medical therapy -Continue IV Solu-Medrol, DuoNeb, Pulmicort, Brovana, Singulair, flutter valve -Appreciate pulmonary input, ON AZITHROMYCIN. Pt reports feeling better today, slowly improving.    Intermittent edema, history of orthopnea -Echo with preserved EF, normal RV, no diastolic dysfunction -Albumin is low normal at 3.1 Euvolemic, on RA.     Leukocytosis, thrombocytosis -Likely reactive from IV steroids, monitor   Prurigo nodularis -Supportive care, recommended anti-inflammatory, elimination diet        Estimated body mass index is 27.76 kg/m as calculated from the following:   Height as of this encounter: 5\' 9"  (1.753 m).   Weight as of this encounter: 85.3 kg.  Code Status: full code.  DVT Prophylaxis:  enoxaparin (LOVENOX) injection 40 mg Start: 01/02/23 1700   Level of Care: Level of care: Med-Surg Family Communication: none at bedside.   Disposition Plan:     pending.   Procedures:  None.   Consultants:   PCCM  Antimicrobials:   Anti-infectives (From admission,  onward)    Start     Dose/Rate Route Frequency Ordered Stop   01/07/23 1615  azithromycin (ZITHROMAX) tablet 500 mg        500 mg Oral Daily 01/07/23 1523          Medications  Scheduled Meds:  arformoterol  15 mcg Nebulization BID   azelastine  1 spray Each Nare q morning   azithromycin  500 mg Oral Daily   budesonide (PULMICORT) nebulizer solution  0.5 mg Nebulization BID   enoxaparin (LOVENOX) injection  40 mg Subcutaneous Q24H   fluticasone  1 spray Each Nare QHS   guaiFENesin  600 mg Oral BID   methylPREDNISolone (SOLU-MEDROL) injection  60 mg Intravenous Q12H   montelukast  10 mg Oral QHS   revefenacin  175 mcg Nebulization Daily   sodium chloride  1 spray Each Nare BID   sodium chloride flush  3 mL Intravenous Q12H   Continuous Infusions: PRN Meds:.acetaminophen **OR** acetaminophen, albuterol, hydrOXYzine, ondansetron **OR** ondansetron (ZOFRAN) IV    Subjective:   Janice Blake was seen and examined today.  Reports she is slowing improving.   Objective:   Vitals:   01/08/23 2041 01/09/23 0402 01/09/23 9562 01/09/23  0751  BP:  135/83  (!) 136/103  Pulse: 79 70 80 76  Resp: 16 15 16 16   Temp:  (!) 97.5 F (36.4 C)  (!) 97.3 F (36.3 C)  TempSrc:      SpO2: 97% 93% 95% 94%  Weight:      Height:        Intake/Output Summary (Last 24 hours) at 01/09/2023 1045 Last data filed at 01/09/2023 0900 Gross per 24 hour  Intake 360 ml  Output --  Net 360 ml   Filed Weights   01/02/23 1139  Weight: 85.3 kg     Exam General exam: Appears calm and comfortable  Respiratory system: BILATERAL exp wheezing anteriorly and posteriorly. No distress on RA.  Cardiovascular system: S1 & S2 heard, RRR. No JVD, murmurs, rubs, gallops or clicks. No pedal edema. Gastrointestinal system: Abdomen is nondistended, soft and nontender. No organomegaly or masses felt. Normal bowel sounds heard. Central nervous system: Alert and oriented. No focal neurological  deficits. Extremities: Symmetric 5 x 5 power. Skin: No rashes, lesions or ulcers Psychiatry: Judgement and insight appear normal. Mood & affect appropriate.    Data Reviewed:  I have personally reviewed following labs and imaging studies   CBC Lab Results  Component Value Date   WBC 17.2 (H) 01/07/2023   RBC 4.69 01/07/2023   HGB 13.0 01/07/2023   HCT 41.7 01/07/2023   MCV 88.9 01/07/2023   MCH 27.7 01/07/2023   PLT 448 (H) 01/07/2023   MCHC 31.2 01/07/2023   RDW 16.1 (H) 01/07/2023   LYMPHSABS 2.8 01/02/2023   MONOABS 0.8 01/02/2023   EOSABS 0.9 (H) 01/02/2023   BASOSABS 0.1 01/02/2023     Last metabolic panel Lab Results  Component Value Date   NA 131 (L) 01/08/2023   K 4.0 01/08/2023   CL 101 01/08/2023   CO2 21 (L) 01/08/2023   BUN 33 (H) 01/08/2023   CREATININE 0.99 01/09/2023   GLUCOSE 171 (H) 01/08/2023   GFRNONAA >60 01/09/2023   GFRAA 83 07/03/2018   CALCIUM 8.7 (L) 01/08/2023   PROT 7.3 01/04/2023   ALBUMIN 3.1 (L) 01/04/2023   LABGLOB 3.8 07/03/2018   AGRATIO 0.9 (L) 07/03/2018   BILITOT 0.5 01/04/2023   ALKPHOS 78 01/04/2023   AST 14 (L) 01/04/2023   ALT 12 01/04/2023   ANIONGAP 9 01/08/2023    CBG (last 3)  No results for input(s): "GLUCAP" in the last 72 hours.    Coagulation Profile: No results for input(s): "INR", "PROTIME" in the last 168 hours.   Radiology Studies: DG CHEST PORT 1 VIEW  Result Date: 01/07/2023 CLINICAL DATA:  Wheezing EXAM: PORTABLE CHEST 1 VIEW COMPARISON:  12/28/2022 FINDINGS: Lungs are clear.  No pleural effusion or pneumothorax. Heart is normal in size. IMPRESSION: No acute cardiopulmonary disease. Electronically Signed   By: Charline Bills M.D.   On: 01/07/2023 17:51       Kathlen Mody M.D. [facility] 01/09/2023, 10:45 AM  Available via Epic secure chat 7am-7pm After 7 pm, please refer to night coverage provider listed on amion.

## 2023-01-09 NOTE — Plan of Care (Signed)
Problem: Education: Goal: Knowledge of General Education information will improve Description: Including pain rating scale, medication(s)/side effects and non-pharmacologic comfort measures Outcome: Progressing   Problem: Clinical Measurements: Goal: Respiratory complications will improve Outcome: Progressing

## 2023-01-09 NOTE — Progress Notes (Addendum)
Mobility Specialist: Progress Note   01/09/23 1228  Mobility  Activity Ambulated independently in hallway  Level of Assistance Independent  Assistive Device None  Distance Ambulated (ft) 550 ft  Activity Response Tolerated well  Mobility Referral Yes  $Mobility charge 1 Mobility  Mobility Specialist Start Time (ACUTE ONLY) [id number]  Mobility Specialist Stop Time (ACUTE ONLY) 0949  Mobility Specialist Time Calculation (min) (ACUTE ONLY) 7 min     Immediately Post Mobility: SpO2 95% RA  Received pt in bed having no complaints and agreeable to mobility. Pt was asymptomatic throughout ambulation and returned to room w/o fault. Left in bed w/ call bell in reach and all needs met.   Maurene Capes Mobility Specialist Please contact via SecureChat or Rehab office at [phone]

## 2023-01-10 DIAGNOSIS — D72829 Elevated white blood cell count, unspecified: Secondary | ICD-10-CM | POA: Diagnosis not present

## 2023-01-10 DIAGNOSIS — J4551 Severe persistent asthma with (acute) exacerbation: Secondary | ICD-10-CM | POA: Diagnosis not present

## 2023-01-10 MED ORDER — METHYLPREDNISOLONE SODIUM SUCC 125 MG IJ SOLR
60.0000 mg | INTRAMUSCULAR | Status: DC
  Administered 2023-01-11: 60 mg via INTRAVENOUS
  Filled 2023-01-10: qty 2

## 2023-01-10 MED ORDER — SENNOSIDES-DOCUSATE SODIUM 8.6-50 MG PO TABS
2.0000 | ORAL_TABLET | Freq: Two times a day (BID) | ORAL | Status: DC
  Administered 2023-01-10 – 2023-01-12 (×5): 2 via ORAL
  Filled 2023-01-10 (×5): qty 2

## 2023-01-10 NOTE — Progress Notes (Signed)
[facility]                                                                               Janice Blake, is a 52 y.o. 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 Also reported mild orthopnea and intermittent edema which was self limiting with elevation prior to admission.  -Very slow improvement on IV steroids, nebs -Pulmonary consulted   Assessment & Plan    Assessment and Plan:     Asthma exacerbation Severe persistent asthma with eosinophilic phenotype -Unfortunately continues to be very slow to improve despite maximal medical therapy - transition IV solumedrol 60 mg  Q12 hrs to 60 mg daily, meanwhile continue with  DuoNeb, Pulmicort, Brovana, Singulair, flutter valve -Appreciate pulmonary input, . Complete the course of azithromycin.  - outpatient biologic agent.  - will need outpatient follow up with pulmonology.    Intermittent edema, history of orthopnea -Echo with preserved EF, normal RV, no diastolic dysfunction -Albumin is low normal at 3.1 Euvolemic, on RA.     Leukocytosis, thrombocytosis -Likely reactive from IV steroids, monitor   Prurigo nodularis -Supportive care, recommended anti-inflammatory, elimination diet    Constipation:  Continue with senna and colace.     Estimated body mass index is 27.76 kg/m as calculated from the following:   Height as of this encounter: 5\' 9"  (1.753 m).   Weight as of this encounter: 85.3 kg.  Code Status: full code.  DVT Prophylaxis:  enoxaparin (LOVENOX) injection 40 mg Start: 01/02/23 1700   Level of Care: Level of care: Med-Surg Family Communication:  none at bedside.   Disposition Plan:     pending. Possibly tomorrow.   Procedures:  None.   Consultants:   PCCM  Antimicrobials:   Anti-infectives (From admission, onward)    Start     Dose/Rate Route Frequency Ordered Stop   01/07/23 1615  azithromycin (ZITHROMAX) tablet 500 mg        500 mg Oral Daily 01/07/23 1523          Medications  Scheduled Meds:  arformoterol  15 mcg Nebulization BID   azelastine  1 spray Each Nare q morning   azithromycin  500 mg Oral Daily   budesonide (PULMICORT) nebulizer solution  0.5 mg Nebulization BID   enoxaparin (LOVENOX) injection  40 mg Subcutaneous Q24H   fluticasone  1 spray Each Nare QHS   guaiFENesin  600 mg Oral BID   methylPREDNISolone (SOLU-MEDROL) injection  60 mg Intravenous Q12H   montelukast  10 mg Oral QHS   revefenacin  175 mcg Nebulization Daily   senna-docusate  2 tablet Oral BID   sodium chloride  1 spray Each Nare BID   sodium chloride flush  3 mL Intravenous Q12H   Continuous Infusions: PRN Meds:.acetaminophen **OR**  acetaminophen, albuterol, hydrOXYzine, ondansetron **OR** ondansetron (ZOFRAN) IV    Subjective:   Janice Blake was seen and examined today.  Feeling Much better.   Objective:   Vitals:   01/10/23 0349 01/10/23 0723 01/10/23 0905 01/10/23 1314  BP: 128/82  138/83 (!) 140/92  Pulse: 67  77 84  Resp: 18  18 18   Temp: 98.3 F (36.8 C)  98.4 F (36.9 C) 97.9 F (36.6 C)  TempSrc:   Oral Oral  SpO2: 96% 95% 92% (!) 89%  Weight:      Height:        Intake/Output Summary (Last 24 hours) at 01/10/2023 1731 Last data filed at 01/10/2023 1400 Gross per 24 hour  Intake 720 ml  Output --  Net 720 ml   Filed Weights   01/02/23 1139  Weight: 85.3 kg     Exam General exam: Appears calm and comfortable  Respiratory system: exp wheezing, bilateral air entry fair. On RA.  Cardiovascular system: S1 & S2 heard, RRR.  Gastrointestinal system: Abdomen is nondistended, soft and nontender.   Central nervous system: Alert and oriented. No focal neurological deficits. Extremities: Symmetric 5 x 5 power. Skin: No rashes,  Psychiatry: Mood & affect appropriate.     Data Reviewed:  I have personally reviewed following labs and imaging studies   CBC Lab Results  Component Value Date   WBC 17.2 (H) 01/07/2023   RBC 4.69 01/07/2023   HGB 13.0 01/07/2023   HCT 41.7 01/07/2023   MCV 88.9 01/07/2023   MCH 27.7 01/07/2023   PLT 448 (H) 01/07/2023   MCHC 31.2 01/07/2023   RDW 16.1 (H) 01/07/2023   LYMPHSABS 2.8 01/02/2023   MONOABS 0.8 01/02/2023   EOSABS 0.9 (H) 01/02/2023   BASOSABS 0.1 01/02/2023     Last metabolic panel Lab Results  Component Value Date   NA 131 (L) 01/08/2023   K 4.0 01/08/2023   CL 101 01/08/2023   CO2 21 (L) 01/08/2023   BUN 33 (H) 01/08/2023   CREATININE 0.99 01/09/2023   GLUCOSE 171 (H) 01/08/2023   GFRNONAA >60 01/09/2023   GFRAA 83 07/03/2018   CALCIUM 8.7 (L) 01/08/2023   PROT 7.3 01/04/2023   ALBUMIN 3.1 (L) 01/04/2023   LABGLOB 3.8 07/03/2018   AGRATIO 0.9 (L) 07/03/2018   BILITOT 0.5 01/04/2023   ALKPHOS 78 01/04/2023   AST 14 (L) 01/04/2023   ALT 12 01/04/2023   ANIONGAP 9 01/08/2023    CBG (last 3)  No results for input(s): "GLUCAP" in the last 72 hours.    Coagulation Profile: No results for input(s): "INR", "PROTIME" in the last 168 hours.   Radiology Studies: No results found.     Kathlen Mody M.D. [facility] 01/10/2023, 5:31 PM  Available via Epic secure chat 7am-7pm After 7 pm, please refer to night coverage provider listed on amion.

## 2023-01-10 NOTE — Plan of Care (Signed)
  Problem: Education: Goal: Knowledge of General Education information will improve Description Including pain rating scale, medication(s)/side effects and non-pharmacologic comfort measures Outcome: Progressing   

## 2023-01-10 NOTE — Plan of Care (Signed)
°  Problem: Clinical Measurements: °Goal: Respiratory complications will improve °Outcome: Progressing °Goal: Cardiovascular complication will be avoided °Outcome: Progressing °  °Problem: Activity: °Goal: Risk for activity intolerance will decrease °Outcome: Progressing °  °

## 2023-01-10 NOTE — Progress Notes (Signed)
NAME:  Janice Blake, MRN:  [id number], DOB:  25-Feb-1971, LOS: 7 ADMISSION DATE:  01/02/2023, CONSULTATION DATE: 01/10/23 REFERRING MD:  Joya Martyr, CHIEF COMPLAINT:  asthma   History of Present Illness:  Janice Blake is a 52 y.o. F with PMH significant for moderate, persistent asthma, chronic rhinitis, prurigo nodularis who presented 9/11 with worsening shortness of breath and wheezing consistent with asthma exacerbation after running out of her home Breztri and Albuterol 6-8 weeks ago after losing her insurance.  She also had family move in with her who smoke heavily, so has been exposed to second hand smoke.  She has not had LE edema, fevers, chills or worsening rhinitis.  Did notice new onset productive cough.   She was admitted required [place] oxygen, treated with steroids, nebulizers, lasix and underwent an echo.  Covid and flu were negative  Since admission, she has been weaned off oxygen and overall is feeling improved, but has persistent wheezing and dyspnea with exertion despite the above treatments.  PCCM was consulted in this setting.   Pertinent  Medical History  Asthma, Chronic rhinitis, Nasal polyposis, Prurigo nodularis  Significant Hospital Events: Including procedures, antibiotic start and stop dates in addition to other pertinent events   9/11 admitted to [facility] with asthma exacerbation  Interim History / Subjective:  Needs to follow-up as an outpatient with pulmonary  Objective   Blood pressure 138/83, pulse 77, temperature 98.4 F (36.9 C), temperature source Oral, resp. rate 18, height 5\' 9"  (1.753 m), weight 85.3 kg, SpO2 92%.        Intake/Output Summary (Last 24 hours) at 01/10/2023 1143 Last data filed at 01/09/2023 1700 Gross per 24 hour  Intake 720 ml  Output --  Net 720 ml   Filed Weights   01/02/23 1139  Weight: 85.3 kg    Awake alert no acute distress at rest Mild expiratory wheezing Decreased breath sounds in the bases Heart sounds are  regular Abdomen soft nontender Extremities without edema   Assessment & Plan:   Acute asthma exacerbation likely from environmental second hand tobacco exposure. 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 - Allergy test 03/23/21 >> negative - A1AT 03/23/21 >> 197 - Aspergillus precipitins 03/23/21 >> negative - RAST 03/24/23 >> Negative; IgE 17 - Spirometry 06/19/22 >> FEV1 1.21 (44%), FEV1% 57 - CBC 01/02/23 >> Eosinophils 0.9 K/uL Plan: She has been followed by allergy and asthma in the past. She is looking to establish with pulmonology at this time. Continue Pulmicort Brovana yupleri Singulair Continue antibiotics Wean steroids Questionable biological agent as an outpatient   Chronic rhinitis with nasal polyposis. Plan: -continue zyrtec, azelastine, flonase, nasal irrigation   Prurigo nodularis. Plan:  per primary team   Social determinants of health. she recently lost her job and with that her health insurance Plan: [facility]  Labs       Latest Ref Rng & Units 01/09/2023    4:37 AM 01/08/2023    4:35 AM 01/07/2023    4:32 AM  CMP  Glucose 70 - 99 mg/dL  409  811   BUN 6 - 20 mg/dL  33  28   Creatinine 9.14 - 1.00 mg/dL 7.82  9.56  2.13   Sodium 135 - 145 mmol/L  131  132   Potassium 3.5 - 5.1 mmol/L  4.0  4.0   Chloride 98 - 111 mmol/L  101  101   CO2 22 - 32 mmol/L  21  22  Calcium 8.9 - 10.3 mg/dL  8.7  8.7       Latest Ref Rng & Units 01/07/2023    4:32 AM 01/04/2023    3:52 AM 01/03/2023    4:54 AM  CBC  WBC 4.0 - 10.5 K/uL 17.2  15.6  11.2   Hemoglobin 12.0 - 15.0 g/dL 54.0  98.1  19.1   Hematocrit 36.0 - 46.0 % 41.7  39.5  36.7   Platelets 150 - 400 K/uL 448  461  432     Signature:  Steve Egidio Lofgren ACNP Acute Care Nurse Practitioner [facility] Please consult Amion 01/10/2023, 11:43 AM

## 2023-01-10 NOTE — Progress Notes (Signed)
Mobility Specialist: Progress Note   01/10/23 1618  Mobility  Activity Ambulated independently in hallway  Level of Assistance Independent  Assistive Device None  Distance Ambulated (ft) 550 ft  Activity Response Tolerated well  Mobility Referral Yes  $Mobility charge 1 Mobility  Mobility Specialist Start Time (ACUTE ONLY) 0932  Mobility Specialist Stop Time (ACUTE ONLY) 0936  Mobility Specialist Time Calculation (min) (ACUTE ONLY) 4 min    During Mobility: SpO2 93% RA  Received pt in bed having no complaints and agreeable to mobility. Pt was asymptomatic throughout ambulation and returned to room w/o fault. Left in bed w/ call bell in reach and all needs met.  Maurene Capes Mobility Specialist Please contact via SecureChat or Rehab office at [phone]

## 2023-01-11 ENCOUNTER — Inpatient Hospital Stay (HOSPITAL_COMMUNITY): Payer: Medicaid Other

## 2023-01-11 DIAGNOSIS — J4551 Severe persistent asthma with (acute) exacerbation: Secondary | ICD-10-CM | POA: Diagnosis not present

## 2023-01-11 DIAGNOSIS — D72829 Elevated white blood cell count, unspecified: Secondary | ICD-10-CM | POA: Diagnosis not present

## 2023-01-11 MED ORDER — KETOROLAC TROMETHAMINE 15 MG/ML IJ SOLN
15.0000 mg | Freq: Once | INTRAMUSCULAR | Status: AC
  Administered 2023-01-11: 15 mg via INTRAVENOUS
  Filled 2023-01-11: qty 1

## 2023-01-11 MED ORDER — METHYLPREDNISOLONE SODIUM SUCC 125 MG IJ SOLR
125.0000 mg | Freq: Once | INTRAMUSCULAR | Status: AC
  Administered 2023-01-11: 125 mg via INTRAVENOUS
  Filled 2023-01-11: qty 2

## 2023-01-11 MED ORDER — DIPHENHYDRAMINE HCL 50 MG/ML IJ SOLN
12.5000 mg | Freq: Once | INTRAMUSCULAR | Status: AC
  Administered 2023-01-11: 12.5 mg via INTRAVENOUS
  Filled 2023-01-11: qty 1

## 2023-01-11 MED ORDER — MUSCLE RUB 10-15 % EX CREA
1.0000 | TOPICAL_CREAM | CUTANEOUS | Status: DC | PRN
  Administered 2023-01-11: 1 via TOPICAL
  Filled 2023-01-11: qty 85

## 2023-01-11 MED ORDER — DIPHENHYDRAMINE HCL 50 MG/ML IJ SOLN
25.0000 mg | Freq: Once | INTRAMUSCULAR | Status: AC
  Administered 2023-01-11: 25 mg via INTRAVENOUS
  Filled 2023-01-11: qty 1

## 2023-01-11 NOTE — Progress Notes (Signed)
[facility]                                                                               Janice Blake, is a 52 y.o. female, DOB - 14-Feb-1971, YQI:[id number] Admit date - 01/02/2023    Outpatient Primary MD for the patient is Pcp, No  LOS - 8  days    Brief summary   52/F with Asthma/COPD overlap, exposed to second hand smoking at home and presented to ED with DoE for 1 week, cough and wheezing, in addition had more dyspnea for few months which she attributes to being off ICS/LABA after losing insurance. Also reported mild orthopnea and intermittent edema which was self limiting with elevation prior to admission.  -Very slow improvement on IV steroids, nebs -Pulmonary consulted   Assessment & Plan    Assessment and Plan:     Asthma exacerbation Severe persistent asthma with eosinophilic phenotype -Unfortunately continues to be very slow to improve despite maximal medical therapy - transition IV solumedrol 60 mg  Q12 hrs to 60 mg daily, meanwhile continue with  DuoNeb, Pulmicort, Brovana, Singulair, flutter valve -Appreciate pulmonary input, . Complete the course of azithromycin.  - outpatient biologic agent.  - will need outpatient follow up with pulmonology.    Intermittent edema, history of orthopnea -Echo with preserved EF, normal RV, no diastolic dysfunction -Albumin is low normal at 3.1 Euvolemic, on RA.     Leukocytosis, thrombocytosis -Likely reactive from IV steroids, monitor   Prurigo nodularis -Supportive care, recommended anti-inflammatory, elimination diet    Constipation:  Continue with senna and colace.    Patient had a allergic reaction to toradol this morning, where she became sob and wheezy.  One dose of IV benadryl and IV solumedrol given.  CXR is unremarkable.  Continue to monitor.     Estimated body mass index is 27.76 kg/m as calculated from the following:   Height as of this encounter: 5\' 9"  (1.753 m).   Weight as of  this encounter: 85.3 kg.  Code Status: full code.  DVT Prophylaxis:  enoxaparin (LOVENOX) injection 40 mg Start: 01/02/23 1700   Level of Care: Level of care: Med-Surg Family Communication: none at bedside.   Disposition Plan:     pending. Possibly  home tomorrow.   Procedures:  None.   Consultants:   PCCM  Antimicrobials:   Anti-infectives (From admission, onward)    Start     Dose/Rate Route Frequency Ordered Stop   01/07/23 1615  azithromycin (ZITHROMAX) tablet 500 mg        500 mg Oral Daily 01/07/23 1523          Medications  Scheduled Meds:  arformoterol  15 mcg Nebulization BID   azelastine  1 spray Each Nare q morning   azithromycin  500 mg Oral Daily   budesonide (PULMICORT) nebulizer solution  0.5 mg Nebulization BID   enoxaparin (LOVENOX) injection  40 mg Subcutaneous Q24H   fluticasone  1 spray Each Nare QHS   guaiFENesin  600 mg Oral BID   methylPREDNISolone (SOLU-MEDROL) injection  60 mg Intravenous Q24H   montelukast  10 mg Oral QHS   revefenacin  175  mcg Nebulization Daily   senna-docusate  2 tablet Oral BID   sodium chloride  1 spray Each Nare BID   sodium chloride flush  3 mL Intravenous Q12H   Continuous Infusions: PRN Meds:.acetaminophen **OR** acetaminophen, albuterol, hydrOXYzine, ondansetron **OR** ondansetron (ZOFRAN) IV    Subjective:   Janice Blake was seen and examined today.  Sob, wheezy earlier this morning,   Objective:   Vitals:   01/10/23 1939 01/10/23 2054 01/11/23 0407 01/11/23 0800  BP: (!) 143/96  119/72 133/81  Pulse: 78  69 94  Resp:    16  Temp: 98.4 F (36.9 C)  97.6 F (36.4 C) 97.8 F (36.6 C)  TempSrc: Oral  Oral Oral  SpO2: 94% 96% 100% 98%  Weight:      Height:        Intake/Output Summary (Last 24 hours) at 01/11/2023 1040 Last data filed at 01/10/2023 2142 Gross per 24 hour  Intake 603 ml  Output --  Net 603 ml   Filed Weights   01/02/23 1139  Weight: 85.3 kg     Exam General exam:  Appears calm and comfortable  Respiratory system: wheezing bilateral, pt more congested, on 2 lit of [place] oxygen.  Cardiovascular system: S1 & S2 heard, RRR.  Gastrointestinal system: Abdomen is nondistended, soft and nontender. Central nervous system: Alert and oriented. No focal neurological deficits. Extremities: Symmetric 5 x 5 power. Skin: No rashes, lesions or ulcers Psychiatry:  Mood & affect appropriate.      Data Reviewed:  I have personally reviewed following labs and imaging studies   CBC Lab Results  Component Value Date   WBC 17.2 (H) 01/07/2023   RBC 4.69 01/07/2023   HGB 13.0 01/07/2023   HCT 41.7 01/07/2023   MCV 88.9 01/07/2023   MCH 27.7 01/07/2023   PLT 448 (H) 01/07/2023   MCHC 31.2 01/07/2023   RDW 16.1 (H) 01/07/2023   LYMPHSABS 2.8 01/02/2023   MONOABS 0.8 01/02/2023   EOSABS 0.9 (H) 01/02/2023   BASOSABS 0.1 01/02/2023     Last metabolic panel Lab Results  Component Value Date   NA 131 (L) 01/08/2023   K 4.0 01/08/2023   CL 101 01/08/2023   CO2 21 (L) 01/08/2023   BUN 33 (H) 01/08/2023   CREATININE 0.99 01/09/2023   GLUCOSE 171 (H) 01/08/2023   GFRNONAA >60 01/09/2023   GFRAA 83 07/03/2018   CALCIUM 8.7 (L) 01/08/2023   PROT 7.3 01/04/2023   ALBUMIN 3.1 (L) 01/04/2023   LABGLOB 3.8 07/03/2018   AGRATIO 0.9 (L) 07/03/2018   BILITOT 0.5 01/04/2023   ALKPHOS 78 01/04/2023   AST 14 (L) 01/04/2023   ALT 12 01/04/2023   ANIONGAP 9 01/08/2023    CBG (last 3)  No results for input(s): "GLUCAP" in the last 72 hours.    Coagulation Profile: No results for input(s): "INR", "PROTIME" in the last 168 hours.   Radiology Studies: DG CHEST PORT 1 VIEW  Result Date: 01/11/2023 CLINICAL DATA:  Shortness of breath. EXAM: PORTABLE CHEST 1 VIEW COMPARISON:  01/07/2023 FINDINGS: The heart is normal in size. Mediastinal contours are stable. No focal airspace disease, large pleural effusion or pneumothorax. Normal pulmonary vasculature. IMPRESSION:  No acute chest findings. Electronically Signed   By: Narda Rutherford M.D.   On: 01/11/2023 09:51       Kathlen Mody M.D. [facility] 01/11/2023, 10:40 AM  Available via Epic secure chat 7am-7pm After 7 pm, please refer to night coverage provider listed on  amion.

## 2023-01-11 NOTE — Progress Notes (Addendum)
Mobility Specialist: Progress Note   01/11/23 1444  Mobility  Activity Ambulated independently in hallway  Level of Assistance Independent  Assistive Device None  Distance Ambulated (ft) 550 ft  Activity Response Tolerated well  Mobility Referral Yes  $Mobility charge 1 Mobility  Mobility Specialist Start Time (ACUTE ONLY) 1103  Mobility Specialist Stop Time (ACUTE ONLY) 1111  Mobility Specialist Time Calculation (min) (ACUTE ONLY) 8 min    During Mobility: SpO2 96% RA  Received pt in bed having no complaints and agreeable to mobility. Pt was asymptomatic throughout ambulation and returned to room w/o fault. Left on EOB w/ call bell in reach and all needs met.  Stated she had some knee pain this morning and last night but none during ambulation - MS provided her with heat packs.   Maurene Capes Mobility Specialist Please contact via SecureChat or Rehab office at [phone]

## 2023-01-11 NOTE — Plan of Care (Signed)
  Problem: Education: Goal: Knowledge of General Education information will improve Description Including pain rating scale, medication(s)/side effects and non-pharmacologic comfort measures Outcome: Progressing   

## 2023-01-11 NOTE — Plan of Care (Signed)
  Problem: Clinical Measurements: Goal: Will remain free from infection Outcome: Progressing Goal: Respiratory complications will improve Outcome: Progressing Goal: Cardiovascular complication will be avoided Outcome: Progressing   Problem: Activity: Goal: Risk for activity intolerance will decrease Outcome: Progressing   Problem: Nutrition: Goal: Adequate nutrition will be maintained Outcome: Progressing   Problem: Pain Managment: Goal: General experience of comfort will improve Outcome: Progressing

## 2023-01-12 ENCOUNTER — Encounter (HOSPITAL_COMMUNITY): Payer: Self-pay | Admitting: Internal Medicine

## 2023-01-12 DIAGNOSIS — J4551 Severe persistent asthma with (acute) exacerbation: Secondary | ICD-10-CM | POA: Diagnosis not present

## 2023-01-12 MED ORDER — REVEFENACIN 175 MCG/3ML IN SOLN: 175.0000 ug | Freq: Every day | RESPIRATORY_TRACT | 2 refills | Status: DC

## 2023-01-12 MED ORDER — BUDESONIDE 0.5 MG/2ML IN SUSP: 0.5000 mg | Freq: Two times a day (BID) | RESPIRATORY_TRACT | 1 refills | Status: DC

## 2023-01-12 MED ORDER — ARFORMOTEROL TARTRATE 15 MCG/2ML IN NEBU: 15.0000 ug | INHALATION_SOLUTION | Freq: Two times a day (BID) | RESPIRATORY_TRACT | 2 refills | Status: DC

## 2023-01-12 MED ORDER — SENNOSIDES-DOCUSATE SODIUM 8.6-50 MG PO TABS: 2.0000 | ORAL_TABLET | Freq: Two times a day (BID) | ORAL | 0 refills | Status: DC

## 2023-01-12 MED ORDER — HYDROXYZINE HCL 10 MG PO TABS: 10.0000 mg | ORAL_TABLET | Freq: Three times a day (TID) | ORAL | 0 refills | Status: DC | PRN

## 2023-01-12 MED ORDER — AZELASTINE HCL 0.1 % NA SOLN: 1.0000 | Freq: Every morning | NASAL | 12 refills | Status: DC

## 2023-01-12 MED ORDER — FLUTICASONE PROPIONATE 50 MCG/ACT NA SUSP: 1.0000 | Freq: Every day | NASAL | 2 refills | Status: AC

## 2023-01-12 MED ORDER — PREDNISONE 10 MG (21) PO TBPK: ORAL_TABLET | ORAL | 0 refills | Status: DC

## 2023-01-12 NOTE — Discharge Summary (Signed)
Physician Discharge Summary   Patient: Janice Blake MRN: [id number] DOB: 1970-11-24  Admit date:     01/02/2023  Discharge date: 01/12/23  Discharge Physician: Kathlen Mody   PCP: Pcp, No   Recommendations at discharge:  Please follow up with pulm in 1 to 2 weeks.   Discharge Diagnoses: Principal Problem:   Asthma exacerbation   Hospital Course: 52/F with Asthma/COPD overlap, exposed to second hand smoking at home and presented to ED with DoE for 1 week, cough and wheezing, in addition had more dyspnea for few months which she attributes to being off ICS/LABA after losing insurance. Also reported mild orthopnea and intermittent edema which was self limiting with elevation prior to admission.  -Very slow improvement on IV steroids, nebs -Pulmonary consulted  Assessment and Plan: Asthma exacerbation Severe persistent asthma with eosinophilic phenotype - continues to be very slow to improve despite maximal medical therapy - transition IV solumedrol 60 mg  Q12 hrs to  prednisone 60 mg daily on discharge with a slow taper., meanwhile continue with  DuoNeb, Pulmicort, Brovana, Singulair, flutter valve -Appreciate pulmonary input, . Completed the course of azithromycin.  - will need outpatient follow up with pulmonology.    Intermittent edema, history of orthopnea -Echo with preserved EF, normal RV, no diastolic dysfunction -Albumin is low normal at 3.1 Euvolemic, on RA.      Leukocytosis, thrombocytosis -Likely reactive from IV steroids, monitor. Recheck wbc count in one week.    Prurigo nodularis -Supportive care, recommended anti-inflammatory, elimination diet     Constipation:  Continue with senna and colace.      Patient had a allergic reaction to toradol yesterday,  where she became sob and wheezy.  One dose of IV benadryl and IV solumedrol given.  CXR is unremarkable.  Continue to monitor.    Estimated body mass index is 27.76 kg/m as calculated from the  following:   Height as of this encounter: 5\' 9"  (1.753 m).   Weight as of this encounter: 85.3 kg.     Consultants: PCCM Procedures performed: none.   Disposition: Home Diet recommendation:  Discharge Diet Orders (From admission, onward)     Start     Ordered   01/12/23 0000  Diet - low sodium heart healthy        01/12/23 1243           Regular diet DISCHARGE MEDICATION: Allergies as of 01/12/2023       Reactions   Ketorolac Shortness Of Breath   Throat swelling > given Solu Medrol + Benadryl   Amoxil [amoxicillin] Hives, Itching   Levaquin [levofloxacin] Hives, Itching        Medication List     STOP taking these medications    Breztri Aerosphere 160-9-4.8 MCG/ACT Aero Generic drug: Budeson-Glycopyrrol-Formoterol   predniSONE 20 MG tablet Commonly known as: DELTASONE Replaced by: predniSONE 10 MG (21) Tbpk tablet       TAKE these medications    albuterol 108 (90 Base) MCG/ACT inhaler Commonly known as: VENTOLIN HFA Inhale 2 puffs into the lungs every 6 (six) hours as needed for wheezing or shortness of breath.   albuterol (2.5 MG/3ML) 0.083% nebulizer solution Commonly known as: PROVENTIL Take 3 mLs (2.5 mg total) by nebulization every 6 (six) hours as needed for wheezing or shortness of breath.   arformoterol 15 MCG/2ML Nebu Commonly known as: BROVANA Take 2 mLs (15 mcg total) by nebulization 2 (two) times daily.   azelastine 0.1 % nasal spray Commonly  known as: ASTELIN Place 1 spray into both nostrils every morning. Use in each nostril as directed Start taking on: January 13, 2023   budesonide 0.5 MG/2ML nebulizer solution Commonly known as: PULMICORT Take 2 mLs (0.5 mg total) by nebulization 2 (two) times daily.   cetirizine 10 MG tablet Commonly known as: ZYRTEC Take 10 mg by mouth daily.   fluticasone 50 MCG/ACT nasal spray Commonly known as: FLONASE Place 1 spray into both nostrils at bedtime.   hydrOXYzine 10 MG  tablet Commonly known as: ATARAX Take 1 tablet (10 mg total) by mouth 3 (three) times daily as needed for itching.   montelukast 10 MG tablet Commonly known as: SINGULAIR Take 1 tablet (10 mg total) by mouth at bedtime.   predniSONE 10 MG (21) Tbpk tablet Commonly known as: STERAPRED UNI-PAK 21 TAB Prednisone 60 mg daily for 4 days followed by  Prednisone 40 mg daily for 4 days followed by  Prednisone 30 mg daily for 4 days followed by  Prednisone 20 mg daily for 4 days followed by  Prednisone 10 mg daily for 4 days. Replaces: predniSONE 20 MG tablet   revefenacin 175 MCG/3ML nebulizer solution Commonly known as: YUPELRI Take 3 mLs (175 mcg total) by nebulization daily. Start taking on: January 13, 2023   senna-docusate 8.6-50 MG tablet Commonly known as: Senokot-S Take 2 tablets by mouth 2 (two) times daily.        Follow-up Information     [facility] Follow up on 01/24/2023.   Why: post hospital followup and to establish primary schedule for 01/24/2023 at1:15 pm with Dr. Rocky Morel Contact information: [address] [place] [address] [phone]        [facility] [place]. Call in 1 week(s).   Specialty: Pulmonology Contact information: [address] [place] [address] [phone]               Discharge Exam: Ceasar Mons Weights   01/02/23 1139  Weight: 85.3 kg   General exam: Appears calm and comfortable  Respiratory system: Clear to auscultation. Respiratory effort normal. Cardiovascular system: S1 & S2 heard, RRR.  Gastrointestinal system: Abdomen is nondistended, soft and nontender.  Central nervous system: Alert and oriented. No focal neurological deficits. Extremities: Symmetric 5 x 5 power. Skin: No rashes, lesions or ulcers Psychiatry:  Mood & affect appropriate.    Condition at discharge: fair  The results of significant  diagnostics from this hospitalization (including imaging, microbiology, ancillary and laboratory) are listed below for reference.   Imaging Studies: DG CHEST PORT 1 VIEW  Result Date: 01/11/2023 CLINICAL DATA:  Shortness of breath. EXAM: PORTABLE CHEST 1 VIEW COMPARISON:  01/07/2023 FINDINGS: The heart is normal in size. Mediastinal contours are stable. No focal airspace disease, large pleural effusion or pneumothorax. Normal pulmonary vasculature. IMPRESSION: No acute chest findings. Electronically Signed   By: Narda Rutherford M.D.   On: 01/11/2023 09:51   DG CHEST PORT 1 VIEW  Result Date: 01/07/2023 CLINICAL DATA:  Wheezing EXAM: PORTABLE CHEST 1 VIEW COMPARISON:  12/28/2022 FINDINGS: Lungs are clear.  No pleural effusion or pneumothorax. Heart is normal in size. IMPRESSION: No acute cardiopulmonary disease. Electronically Signed   By: Charline Bills M.D.   On: 01/07/2023 17:51   ECHOCARDIOGRAM COMPLETE  Result Date: 01/03/2023    ECHOCARDIOGRAM REPORT   Patient Name:   CHANTRA WERTHEIMER Date of Exam: 01/03/2023 Medical Rec #:  [id number]  Height:       69.0 in Accession #:    [id number]        Weight:       188.0 lb Date of Birth:  1970/11/16          BSA:          2.012 m Patient Age:    52 years          BP:           152/90 mmHg Patient Gender: F                 HR:           75 bpm. Exam Location:  Inpatient Procedure: 2D Echo, Cardiac Doppler and Color Doppler Indications:    Dyspnea R06.00  History:        Patient has no prior history of Echocardiogram examinations.                 Signs/Symptoms:Shortness of Breath.  Sonographer:    Lucendia Herrlich Referring Phys: [phone] PREETHA JOSEPH IMPRESSIONS  1. Left ventricular ejection fraction, by estimation, is 60 to 65%. The left ventricle has normal function. The left ventricle has no regional wall motion abnormalities. Left ventricular diastolic parameters were normal.  2. Right ventricular systolic function is normal. The right  ventricular size is normal.  3. The mitral valve is normal in structure. No evidence of mitral valve regurgitation. No evidence of mitral stenosis.  4. The aortic valve is normal in structure. Aortic valve regurgitation is not visualized. No aortic stenosis is present.  5. The inferior vena cava is dilated in size with >50% respiratory variability, suggesting right atrial pressure of 8 mmHg. FINDINGS  Left Ventricle: Left ventricular ejection fraction, by estimation, is 60 to 65%. The left ventricle has normal function. The left ventricle has no regional wall motion abnormalities. The left ventricular internal cavity size was normal in size. There is  no left ventricular hypertrophy. Left ventricular diastolic parameters were normal. Right Ventricle: The right ventricular size is normal. No increase in right ventricular wall thickness. Right ventricular systolic function is normal. Left Atrium: Left atrial size was normal in size. Right Atrium: Right atrial size was normal in size. Pericardium: There is no evidence of pericardial effusion. Mitral Valve: The mitral valve is normal in structure. No evidence of mitral valve regurgitation. No evidence of mitral valve stenosis. Tricuspid Valve: The tricuspid valve is normal in structure. Tricuspid valve regurgitation is trivial. No evidence of tricuspid stenosis. Aortic Valve: The aortic valve is normal in structure. Aortic valve regurgitation is not visualized. No aortic stenosis is present. Aortic valve peak gradient measures 10.0 mmHg. Pulmonic Valve: The pulmonic valve was normal in structure. Pulmonic valve regurgitation is trivial. No evidence of pulmonic stenosis. Aorta: The aortic root is normal in size and structure. Venous: The inferior vena cava is dilated in size with greater than 50% respiratory variability, suggesting right atrial pressure of 8 mmHg. IAS/Shunts: No atrial level shunt detected by color flow Doppler.  LEFT VENTRICLE PLAX 2D LVIDd:          4.30 cm   Diastology LVIDs:         2.90 cm   LV e' medial:    10.60 cm/s LV PW:         1.00 cm   LV E/e' medial:  6.2 LV IVS:        0.90 cm   LV e' lateral:   12.40  cm/s LVOT diam:     2.10 cm   LV E/e' lateral: 5.3 LV SV:         68 LV SV Index:   34 LVOT Area:     3.46 cm  RIGHT VENTRICLE             IVC RV S prime:     16.90 cm/s  IVC diam: 2.60 cm TAPSE (M-mode): 2.3 cm LEFT ATRIUM           Index        RIGHT ATRIUM          Index LA diam:      3.40 cm 1.69 cm/m   RA Area:     9.19 cm LA Vol (A2C): 50.3 ml 25.00 ml/m  RA Volume:   16.60 ml 8.25 ml/m LA Vol (A4C): 30.8 ml 15.31 ml/m  AORTIC VALVE AV Area (Vmax): 2.54 cm AV Vmax:        158.00 cm/s AV Peak Grad:   10.0 mmHg LVOT Vmax:      116.00 cm/s LVOT Vmean:     73.600 cm/s LVOT VTI:       0.196 m  AORTA Ao Root diam: 3.50 cm Ao Asc diam:  3.20 cm MITRAL VALVE               TRICUSPID VALVE MV Area (PHT): 3.27 cm    TR Peak grad:   26.0 mmHg MV Decel Time: 232 msec    TR Vmax:        255.00 cm/s MV E velocity: 65.80 cm/s MV A velocity: 79.40 cm/s  SHUNTS MV E/A ratio:  0.83        Systemic VTI:  0.20 m                            Systemic Diam: 2.10 cm Arvilla Meres MD Electronically signed by Arvilla Meres MD Signature Date/Time: 01/03/2023/2:03:46 PM    Final    DG Chest 1 View  Result Date: 12/28/2022 CLINICAL DATA:  Shortness of breath EXAM: CHEST  1 VIEW COMPARISON:  X-ray 05/05/2013 FINDINGS: The heart size and mediastinal contours are within normal limits. No consolidation, pneumothorax or effusion. No edema. The visualized skeletal structures are unremarkable. IMPRESSION: No acute cardiopulmonary disease. Electronically Signed   By: Karen Kays M.D.   On: 12/28/2022 16:48    Microbiology: Results for orders placed or performed during the hospital encounter of 12/28/22  Resp panel by RT-PCR (RSV, Flu A&B, Covid) Anterior Nasal Swab     Status: None   Collection Time: 12/28/22  3:21 PM   Specimen: Anterior Nasal Swab  Result  Value Ref Range Status   SARS Coronavirus 2 by RT PCR NEGATIVE NEGATIVE Final   Influenza A by PCR NEGATIVE NEGATIVE Final   Influenza B by PCR NEGATIVE NEGATIVE Final    Comment: (NOTE) The Xpert Xpress SARS-CoV-2/FLU/RSV plus assay is intended as an aid in the diagnosis of influenza from Nasopharyngeal swab specimens and should not be used as a sole basis for treatment. Nasal washings and aspirates are unacceptable for Xpert Xpress SARS-CoV-2/FLU/RSV testing.  Fact Sheet for Patients: BloggerCourse.com  Fact Sheet for Healthcare Providers: SeriousBroker.it  This test is not yet approved or cleared by the [place] FDA and has been authorized for detection and/or diagnosis of SARS-CoV-2 by FDA under an Emergency Use Authorization (EUA). This EUA will remain in effect (meaning this test can  be used) for the duration of the COVID-19 declaration under Section 564(b)(1) of the Act, 21 U.S.C. section 360bbb-3(b)(1), unless the authorization is terminated or revoked.     Resp Syncytial Virus by PCR NEGATIVE NEGATIVE Final    Comment: (NOTE) Fact Sheet for Patients: BloggerCourse.com  Fact Sheet for Healthcare Providers: SeriousBroker.it  This test is not yet approved or cleared by the [place] FDA and has been authorized for detection and/or diagnosis of SARS-CoV-2 by FDA under an Emergency Use Authorization (EUA). This EUA will remain in effect (meaning this test can be used) for the duration of the COVID-19 declaration under Section 564(b)(1) of the Act, 21 U.S.C. section 360bbb-3(b)(1), unless the authorization is terminated or revoked.  Performed at [facility], [address], [place], [place] [address]     Labs: CBC: Recent Labs  Lab 01/07/23 0432  WBC 17.2*  HGB 13.0  HCT 41.7  MCV 88.9  PLT 448*   Basic Metabolic Panel: Recent Labs  Lab  01/07/23 0432 01/08/23 0435 01/09/23 0437  NA 132* 131*  --   K 4.0 4.0  --   CL 101 101  --   CO2 22 21*  --   GLUCOSE 147* 171*  --   BUN 28* 33*  --   CREATININE 1.01* 1.02* 0.99  CALCIUM 8.7* 8.7*  --    Liver Function Tests: No results for input(s): "AST", "ALT", "ALKPHOS", "BILITOT", "PROT", "ALBUMIN" in the last 168 hours. CBG: No results for input(s): "GLUCAP" in the last 168 hours.  Discharge time spent: 39 minutes.   Signed: Kathlen Mody, MD [facility] 01/12/2023

## 2023-01-24 ENCOUNTER — Ambulatory Visit: Payer: Medicaid Other | Admitting: Student

## 2023-01-24 NOTE — Progress Notes (Deleted)
52 year old female with COPD asthma Admitted 01/02/2023 - 01/12/2023 at [facility] for asthma exacerbation - Albuterol inhaler, azelastine nasal spray, Brovana BID, Pulmicort BID, Flonase, Singulair, prednisone taper (finish 02/01/2023), and Christella Hartigan, Elinor Dodge, Pulmicort-ICS

## 2023-02-11 ENCOUNTER — Other Ambulatory Visit (HOSPITAL_COMMUNITY): Payer: Self-pay

## 2023-02-11 ENCOUNTER — Telehealth (HOSPITAL_COMMUNITY): Payer: Self-pay | Admitting: Pharmacy Technician

## 2023-02-11 NOTE — Telephone Encounter (Signed)
Pharmacy Patient Advocate Encounter  Received notification from [facility] that Prior Authorization for Yupelri 175MCG/3ML solution has been APPROVED from 02/11/2023 to 02/11/2024. Ran test claim, Copay is $4.00. This test claim was processed through [facility]- copay amounts may vary at other pharmacies due to pharmacy/plan contracts, or as the patient moves through the different stages of their insurance plan.   PA #/Case ID/Reference #: [id number]

## 2023-02-19 ENCOUNTER — Ambulatory Visit: Payer: Medicaid Other | Admitting: Student

## 2023-03-13 ENCOUNTER — Telehealth: Payer: Medicaid Other | Admitting: Physician Assistant

## 2023-03-13 DIAGNOSIS — J4541 Moderate persistent asthma with (acute) exacerbation: Secondary | ICD-10-CM

## 2023-03-13 MED ORDER — MOMETASONE FURO-FORMOTEROL FUM 200-5 MCG/ACT IN AERO
2.0000 | INHALATION_SPRAY | Freq: Two times a day (BID) | RESPIRATORY_TRACT | 0 refills | Status: DC
Start: 2023-03-13 — End: 2023-04-02

## 2023-03-13 MED ORDER — PREDNISONE 20 MG PO TABS
40.0000 mg | ORAL_TABLET | Freq: Every day | ORAL | 0 refills | Status: DC
Start: 2023-03-13 — End: 2023-04-02

## 2023-03-13 NOTE — Progress Notes (Signed)
Virtual Visit Consent   [name], you are scheduled for a virtual visit with a [facility] provider today. Just as with appointments in the office, your consent must be obtained to participate. Your consent will be active for this visit and any virtual visit you may have with one of our providers in the next 365 days. If you have a MyChart account, a copy of this consent can be sent to you electronically.  As this is a virtual visit, video technology does not allow for your provider to perform a traditional examination. This may limit your provider's ability to fully assess your condition. If your provider identifies any concerns that need to be evaluated in person or the need to arrange testing (such as labs, EKG, etc.), we will make arrangements to do so. Although advances in technology are sophisticated, we cannot ensure that it will always work on either your end or our end. If the connection with a video visit is poor, the visit may have to be switched to a telephone visit. With either a video or telephone visit, we are not always able to ensure that we have a secure connection.  By engaging in this virtual visit, you consent to the provision of healthcare and authorize for your insurance to be billed (if applicable) for the services provided during this visit. Depending on your insurance coverage, you may receive a charge related to this service.  I need to obtain your verbal consent now. Are you willing to proceed with your visit today? GAEA COOMER has provided verbal consent on 03/13/2023 for a virtual visit (video or telephone). Piedad Climes, [place]  Date: 03/13/2023 7:54 PM  Virtual Visit via Video Note   I, Piedad Climes, connected with  WANNETTA LUPFER  ([id number], 11-22-1970) on 03/13/23 at  7:45 PM EST by a video-enabled telemedicine application and verified that I am speaking with the correct person using two identifiers.  Location: Patient: Virtual Visit  Location Patient: Home Provider: Virtual Visit Location Provider: Home Office   I discussed the limitations of evaluation and management by telemedicine and the availability of in person appointments. The patient expressed understanding and agreed to proceed.    History of Present Illness: MAISEE KANAK is a 52 y.o. who identifies as a female who was assigned female at birth, and is being seen today for start of asthma exacerbation over the past 1.5 days with the weather change. Notes chest tightness and wheezing with dry, persistent cough. Some associated nasal drainage. Denies fever, chills, aches. Denies chest pain. Using her albuterol but does not feel it helps. Has continued her daily Singulair and allergy medications. Previously on [facility] but was switched to [facility] which was not covered. As such she has not had a maintenance inhaler since hospitalization in September. Has upcoming new patient appt with Pulmonology in a few weeks.  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   Colon cancer screening declined 04/08/2020   COVID-19 vaccination declined 04/08/2020   Influenza vaccination declined 04/08/2020   Overweight 10/15/2017   Back muscle spasm 01/22/2014   Menorrhagia with regular cycle 11/30/2013   Anemia, iron deficiency 11/30/2013   History of nasal polyp 11/30/2013   Allergic rhinitis 05/05/2013   Aspirin-exacerbated respiratory  disease (AERD)     Allergies:  Allergies  Allergen Reactions   Ketorolac Shortness Of Breath    Throat swelling > given Solu Medrol + Benadryl   Amoxil [Amoxicillin] Hives and Itching   Levaquin [Levofloxacin] Hives and Itching   Medications:  Current Outpatient Medications:    mometasone-formoterol  (DULERA) 200-5 MCG/ACT AERO, Inhale 2 puffs into the lungs 2 (two) times daily., Disp: 13 g, Rfl: 0   predniSONE (DELTASONE) 20 MG tablet, Take 2 tablets (40 mg total) by mouth daily with breakfast., Disp: 10 tablet, Rfl: 0   albuterol (PROVENTIL) (2.5 MG/3ML) 0.083% nebulizer solution, Take 3 mLs (2.5 mg total) by nebulization every 6 (six) hours as needed for wheezing or shortness of breath., Disp: 75 mL, Rfl: 12   albuterol (VENTOLIN HFA) 108 (90 Base) MCG/ACT inhaler, Inhale 2 puffs into the lungs every 6 (six) hours as needed for wheezing or shortness of breath., Disp: 18 g, Rfl: 0   azelastine (ASTELIN) 0.1 % nasal spray, Place 1 spray into both nostrils every morning. Use in each nostril as directed, Disp: 30 mL, Rfl: 12   budesonide (PULMICORT) 0.5 MG/2ML nebulizer solution, Take 2 mLs (0.5 mg total) by nebulization 2 (two) times daily., Disp: 120 mL, Rfl: 1   cetirizine (ZYRTEC) 10 MG tablet, Take 10 mg by mouth daily., Disp: , Rfl:    fluticasone (FLONASE) 50 MCG/ACT nasal spray, Place 1 spray into both nostrils at bedtime., Disp: 16 g, Rfl: 2   hydrOXYzine (ATARAX) 10 MG tablet, Take 1 tablet (10 mg total) by mouth 3 (three) times daily as needed for itching., Disp: 30 tablet, Rfl: 0   montelukast (SINGULAIR) 10 MG tablet, Take 1 tablet (10 mg total) by mouth at bedtime., Disp: 30 tablet, Rfl: 5   revefenacin (YUPELRI) 175 MCG/3ML nebulizer solution, Take 3 mLs (175 mcg total) by nebulization daily., Disp: 90 mL, Rfl: 2   senna-docusate (SENOKOT-S) 8.6-50 MG tablet, Take 2 tablets by mouth 2 (two) times daily., Disp: 20 tablet, Rfl: 0  Observations/Objective: Patient is well-developed, well-nourished in no acute distress.  Resting comfortably at home.  Head is normocephalic, atraumatic.  No labored breathing. Speech is clear and coherent with logical content.  Patient is alert and oriented at baseline.   Assessment and Plan: 1. Moderate persistent asthma with exacerbation -  mometasone-formoterol (DULERA) 200-5 MCG/ACT AERO; Inhale 2 puffs into the lungs 2 (two) times daily.  Dispense: 13 g; Refill: 0 - predniSONE (DELTASONE) 20 MG tablet; Take 2 tablets (40 mg total) by mouth daily with breakfast.  Dispense: 10 tablet; Refill: 0  Rx Prednisone burst and will restart Dulera since she tolerates and is covered.  Increase fluids.  Rest.  Saline nasal spray.  Probiotic.  Mucinex as directed.  Humidifier in bedroom. Continue Singulair, albuterol and allergy medications. Strict ER precautions reviewed.   Follow Up Instructions: I discussed the assessment and treatment plan with the patient. The patient was provided an opportunity to ask questions and all were answered. The patient agreed with the plan and demonstrated an understanding of the instructions.  A copy of instructions were sent to the patient via MyChart unless otherwise noted below.   The patient was advised to call back or seek an in-person evaluation if the symptoms worsen or if the condition fails to improve as anticipated.    Piedad Climes, PA-C

## 2023-03-13 NOTE — Patient Instructions (Signed)
Janice Blake, thank you for joining Piedad Climes, PA-C for today's virtual visit.  While this provider is not your primary care provider (PCP), if your PCP is located in our provider database this encounter information will be shared with them immediately following your visit.   A [facility] MyChart account gives you access to today's visit and all your visits, tests, and labs performed at [facility] " click here if you don't have a [facility] MyChart account or go to [email or web address]  Consent: (Patient) Janice Blake provided verbal consent for this virtual visit at the beginning of the encounter.  Current Medications:  Current Outpatient Medications:    albuterol (PROVENTIL) (2.5 MG/3ML) 0.083% nebulizer solution, Take 3 mLs (2.5 mg total) by nebulization every 6 (six) hours as needed for wheezing or shortness of breath., Disp: 75 mL, Rfl: 12   albuterol (VENTOLIN HFA) 108 (90 Base) MCG/ACT inhaler, Inhale 2 puffs into the lungs every 6 (six) hours as needed for wheezing or shortness of breath., Disp: 18 g, Rfl: 0   arformoterol (BROVANA) 15 MCG/2ML NEBU, Take 2 mLs (15 mcg total) by nebulization 2 (two) times daily., Disp: 120 mL, Rfl: 2   azelastine (ASTELIN) 0.1 % nasal spray, Place 1 spray into both nostrils every morning. Use in each nostril as directed, Disp: 30 mL, Rfl: 12   budesonide (PULMICORT) 0.5 MG/2ML nebulizer solution, Take 2 mLs (0.5 mg total) by nebulization 2 (two) times daily., Disp: 120 mL, Rfl: 1   cetirizine (ZYRTEC) 10 MG tablet, Take 10 mg by mouth daily., Disp: , Rfl:    fluticasone (FLONASE) 50 MCG/ACT nasal spray, Place 1 spray into both nostrils at bedtime., Disp: 16 g, Rfl: 2   hydrOXYzine (ATARAX) 10 MG tablet, Take 1 tablet (10 mg total) by mouth 3 (three) times daily as needed for itching., Disp: 30 tablet, Rfl: 0   montelukast (SINGULAIR) 10 MG tablet, Take 1 tablet (10 mg total) by mouth at bedtime., Disp: 30 tablet,  Rfl: 5   predniSONE (STERAPRED UNI-PAK 21 TAB) 10 MG (21) TBPK tablet, Prednisone 60 mg daily for 4 days followed by  Prednisone 40 mg daily for 4 days followed by  Prednisone 30 mg daily for 4 days followed by  Prednisone 20 mg daily for 4 days followed by  Prednisone 10 mg daily for 4 days., Disp: 64 tablet, Rfl: 0   revefenacin (YUPELRI) 175 MCG/3ML nebulizer solution, Take 3 mLs (175 mcg total) by nebulization daily., Disp: 90 mL, Rfl: 2   senna-docusate (SENOKOT-S) 8.6-50 MG tablet, Take 2 tablets by mouth 2 (two) times daily., Disp: 20 tablet, Rfl: 0   Medications ordered in this encounter:  No orders of the defined types were placed in this encounter.    *If you need refills on other medications prior to your next appointment, please contact your pharmacy*  Follow-Up: Call back or seek an in-person evaluation if the symptoms worsen or if the condition fails to improve as anticipated.  [facility] [phone]  Other Instructions Please continue your regular medications, adding on the prednisone and the [facility]. Start OTC Mucinex twice daily.  If symptoms are not improving, or any new/worsening symptoms, you need an in-person evaluation ASAP.   If you have been instructed to have an in-person evaluation today at a local Urgent Care facility, please use the link below. It will take you to a list of all of our available [facility], including address, phone number  and hours of operation. Please do not delay care.  [facility]  If you or a family member do not have a primary care provider, use the link below to schedule a visit and establish care. When you choose a [facility] primary care physician or advanced practice provider, you gain a long-term partner in health. Find a Primary Care Provider  Learn more about [facility]'s in-office and virtual care options: [facility] - Get Care Now

## 2023-03-14 ENCOUNTER — Other Ambulatory Visit (HOSPITAL_COMMUNITY): Payer: Self-pay

## 2023-04-02 ENCOUNTER — Encounter: Payer: Self-pay | Admitting: Student

## 2023-04-02 ENCOUNTER — Ambulatory Visit: Payer: Medicaid Other | Admitting: Student

## 2023-04-02 VITALS — BP 127/76 | HR 98 | Temp 98.1°F | Ht 69.0 in | Wt 238.5 lb

## 2023-04-02 DIAGNOSIS — J3089 Other allergic rhinitis: Secondary | ICD-10-CM

## 2023-04-02 DIAGNOSIS — J4489 Other specified chronic obstructive pulmonary disease: Secondary | ICD-10-CM

## 2023-04-02 DIAGNOSIS — J4540 Moderate persistent asthma, uncomplicated: Secondary | ICD-10-CM

## 2023-04-02 MED ORDER — BREZTRI AEROSPHERE 160-9-4.8 MCG/ACT IN AERO
2.0000 | INHALATION_SPRAY | Freq: Two times a day (BID) | RESPIRATORY_TRACT | 2 refills | Status: DC
Start: 2023-04-02 — End: 2023-07-21

## 2023-04-02 MED ORDER — HYDROXYZINE HCL 10 MG PO TABS
10.0000 mg | ORAL_TABLET | Freq: Three times a day (TID) | ORAL | 0 refills | Status: DC | PRN
Start: 2023-04-02 — End: 2023-07-22

## 2023-04-02 NOTE — Progress Notes (Signed)
CC:  Chief Complaint  Patient presents with   Hospitalization Follow-up   Medication Refill   HPI:  Ms.Janice Blake is a 52 y.o. female living with a history stated below and presents today for HFU on COPD/Asthma exacerbation. Please see problem based assessment and plan for additional details.  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 75 mL 12   albuterol (VENTOLIN HFA) 108 (90 Base) MCG/ACT inhaler Inhale 2 puffs into the lungs every 6 (six) hours as needed for wheezing or shortness of breath. 18 g 0   azelastine (ASTELIN) 0.1 % nasal spray Place 1 spray into both nostrils every morning. Use in each nostril as directed 30 mL 12   fluticasone (FLONASE) 50 MCG/ACT nasal spray Place 1 spray into both nostrils at bedtime. 16 g 2   montelukast (SINGULAIR) 10 MG tablet Take 1 tablet (10 mg total) by mouth at bedtime. 30 tablet 5   senna-docusate (SENOKOT-S) 8.6-50 MG tablet Take 2 tablets by mouth 2 (two) times daily. 20 tablet 0   No current facility-administered medications on file prior to visit.   Family History  Problem Relation Age of Onset   Allergic rhinitis Mother    Asthma Father    Hypertension Father    Diabetes Maternal Grandmother    Hypertension Maternal Grandfather    Cancer Paternal Grandmother    Cancer Paternal Grandfather        lung cancer    Asthma Son    Social History   Socioeconomic History   Marital status: Single    Spouse name: Not on file   Number of children: Not on  file   Years of education: Not on file   Highest education level: Not on file  Occupational History   Not on file  Tobacco Use   Smoking status: Never   Smokeless tobacco: Never  Substance and Sexual Activity   Alcohol use: Yes    Comment: occ.   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 3/4 sons live in [place]            Social Determinants of Health   Financial Resource Strain: Not on file  Food Insecurity: No Food Insecurity (01/02/2023)   Hunger Vital Sign    Worried About Running Out of Food in the Last Year: Never true    Ran Out of Food in the Last Year: Never true  Transportation Needs: No Transportation Needs (01/02/2023)   PRAPARE - Administrator, Civil Service (Medical): No    Lack of Transportation (Non-Medical): No  Physical Activity: Not on file  Stress: Not on file  Social Connections: Not on  file  Intimate Partner Violence: Not At Risk (01/02/2023)   Humiliation, Afraid, Rape, and Kick questionnaire    Fear of Current or Ex-Partner: No    Emotionally Abused: No    Physically Abused: No    Sexually Abused: No   Review of Systems: ROS negative except for what is noted on the assessment and plan.  Vitals:   04/02/23 1406  BP: 127/76  Pulse: 98  Temp: 98.1 F (36.7 C)  TempSrc: Oral  SpO2: 98%  Weight: 238 lb 8 oz (108.2 kg)  Height: 5\' 9"  (1.753 m)    Physical Exam: Constitutional: well-appearing  in NAD HENT: normocephalic atraumatic, mucous membranes moist, congested, nasal speech Eyes: conjunctiva non-erythematous Cardiovascular: regular rate and rhythm, no m/r/g Pulmonary/Chest: normal work of breathing on room air,Mild wheezing on upper lung fields. No accessory muscle use.  Abdominal: soft, non-tender, non-distended MSK:  normal bulk and tone Neurological: alert & oriented no gross focal deficit. Skin: warm and dry Psych: normal mood and behavior  Assessment & Plan:   Patient seen with Dr. 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 She did a video appt with Family medicine after that and was given dulera and prednisone. Prednisone is the only thing that has given her relief, and wondering if she can get this now.  Went through her medicine list with her today:   Duoneb - had issues with her insurance and never received this med.  Pulmicort (ics)- using budesonide with the albuterol solution. Using it in the morning and at night before she goes to work.  Has the inhaler but if she uses the inhaler it just gives her a moment of relief. It has been a few days ago since she used albuterol inhaler. Brovana (arformoterol)-   denies taking this medication  Singulair (motelukast) - taking every night.  Dulera (mometasone and formoterol) 2 puffs BID   Prednisone 5 days after her follow up with FM. It helped with her symptoms.  Exposed to second hand smoke, she does not smoke tobacco. Second hand smoke now not in the house as it was previously. SOB not related to particular environment, SOB at home and at work. Symptoms daily and wakes up nightly.  Feels like she needs more pillows but this has been happening since she has been having her symptoms. Has been ongoing.  No swelling in the bilateral feet. Was previously on Breztri which also helped her symptoms. Had insurance changes. Discussed risks of prednisone and starting prednisone long term. I do believe she would benefit from maximizing with triple therapy at this point as she is not currently on a LABA + LAMA + ICS.   -Start Breztri  -Discontinue ICS and dulera  -Cw montelukast -albuterol PRN -FU in 4 weeks   -Referral to Pulm   Allergic rhinitis Has nasal speech is congested and sneezes. Eyes get watery. Has  nasal polyps. Has tried Zyrtec uses Hydroxyzine for anxiety. Claritin did not help in the past.   -Try a different antihistamine, recommended allegra  -Stop Zyrtec  -If Allegra not working, then add hydroxyzine PRN  -Cw Azelastin  -cw Flonase  Manuela Neptune, MD [facility] Internal [facility], PGY-1 Phone: [phone] Date 04/02/2023 Time 3:16 PM

## 2023-04-02 NOTE — Assessment & Plan Note (Addendum)
Has nasal speech is congested and sneezes. Eyes get watery. Has nasal polyps. Has tried Zyrtec uses Hydroxyzine for anxiety. Claritin did not help in the past.   -Try a different antihistamine, recommended allegra  -Stop Zyrtec  -If Allegra not working, then add hydroxyzine PRN  -Cw Azelastin  -cw Flonase

## 2023-04-02 NOTE — Assessment & Plan Note (Addendum)
Following up after her hospitalization for COPD/Asthma overlap. She did a video appt with Family medicine after that and was given dulera and prednisone. Prednisone is the only thing that has given her relief, and wondering if she can get this now.  Went through her medicine list with her today:   Duoneb - had issues with her insurance and never received this med.  Pulmicort (ics)- using budesonide with the albuterol solution. Using it in the morning and at night before she goes to work.  Has the inhaler but if she uses the inhaler it just gives her a moment of relief. It has been a few days ago since she used albuterol inhaler. Brovana (arformoterol)-   denies taking this medication  Singulair (motelukast) - taking every night.  Dulera (mometasone and formoterol) 2 puffs BID   Prednisone 5 days after her follow up with FM. It helped with her symptoms.  Exposed to second hand smoke, she does not smoke tobacco. Second hand smoke now not in the house as it was previously. SOB not related to particular environment, SOB at home and at work. Symptoms daily and wakes up nightly.  Feels like she needs more pillows but this has been happening since she has been having her symptoms. Has been ongoing.  No swelling in the bilateral feet. Was previously on Breztri which also helped her symptoms. Had insurance changes. Discussed risks of prednisone and starting prednisone long term. I do believe she would benefit from maximizing with triple therapy at this point as she is not currently on a LABA + LAMA + ICS.   -Start Breztri  -Discontinue ICS and dulera  -Cw montelukast -albuterol PRN -FU in 4 weeks   -Referral to [facility]

## 2023-04-02 NOTE — Patient Instructions (Addendum)
Thank you, Ms.Aydan L Glaza for allowing [place] to provide your care today.   Referrals ordered today:   Referral Orders         Ambulatory referral to Pulmonology       I have ordered the following medication/changed the following medications:   Stop the following medications: Medications Discontinued During This Encounter  Medication Reason   revefenacin (YUPELRI) 175 MCG/3ML nebulizer solution    predniSONE (DELTASONE) 20 MG tablet    mometasone-formoterol (DULERA) 200-5 MCG/ACT AERO    budesonide (PULMICORT) 0.5 MG/2ML nebulizer solution    cetirizine (ZYRTEC) 10 MG tablet    hydrOXYzine (ATARAX) 10 MG tablet Reorder     Start the following medications: Meds ordered this encounter  Medications   Budeson-Glycopyrrol-Formoterol (BREZTRI AEROSPHERE) 160-9-4.8 MCG/ACT AERO    Sig: Inhale 2 puffs into the lungs in the morning and at bedtime.    Dispense:  10.7 g    Refill:  2   hydrOXYzine (ATARAX) 10 MG tablet    Sig: Take 1 tablet (10 mg total) by mouth 3 (three) times daily as needed for itching.    Dispense:  30 tablet    Refill:  0     Follow up:  4 weeks      Remember:    Stop your pulmicort and dulera when you receive the Breztri. Start Breztri twice daily.  Use your albuterol nebulizer/inhaler as needed   Stop Zyrtec. Please try Allegra. If Allegra itself is not working, add on the hydroxyzine as needed.   Should you have any questions or concerns please call the internal medicine clinic at [phone].    Manuela Neptune, MD [facility]

## 2023-04-03 ENCOUNTER — Telehealth: Payer: Self-pay

## 2023-04-03 NOTE — Telephone Encounter (Signed)
Prior Authorization for patient Janice Blake Aerosphere 160-9-4.8MCG/ACT aerosol) came through on cover my meds was submitted with last office notes awaiting approval or denial.  WUJ:[id number]

## 2023-04-03 NOTE — Progress Notes (Signed)
Internal Medicine Clinic Attending  I was physically present during the key portions of the resident provided service and participated in the medical decision making of patient's management care. I reviewed pertinent patient test results.  The assessment, diagnosis, and plan were formulated together and I agree with the documentation in the resident's note.  Lau, Grace, MD  

## 2023-04-03 NOTE — Telephone Encounter (Signed)
Decision:Approved Janice Blake (Key: Niland) PA Case ID #: [id number] Rx #: [id number] Need Help? Call us at [phone] Outcome Approved today by [facility] Case: [id number], Status: Approved, Coverage Starts on: 04/03/2023 12:00:00 AM, Coverage Ends on: 04/02/2024 12:00:00 AM. Authorization Expiration Date: 04/01/2024 Drug Breztri Aerosphere 160-9-4.8MCG/ACT aerosol ePA cloud logo Form [employer] [place] Electronic [place] Form [phone] NCPDP) Original Claim Info [id number]

## 2023-04-19 ENCOUNTER — Telehealth: Payer: Medicaid Other | Admitting: Emergency Medicine

## 2023-04-19 DIAGNOSIS — J45901 Unspecified asthma with (acute) exacerbation: Secondary | ICD-10-CM

## 2023-04-19 MED ORDER — PREDNISONE 20 MG PO TABS: 40.0000 mg | ORAL_TABLET | Freq: Every day | ORAL | 0 refills | Status: DC

## 2023-04-19 NOTE — Progress Notes (Signed)
Virtual Visit Consent   Janice Blake, you are scheduled for a virtual visit with a [facility] [place] provider today. Just as with appointments in the office, your consent must be obtained to participate. Your consent will be active for this visit and any virtual visit you may have with one of our providers in the next 365 days. If you have a MyChart account, a copy of this consent can be sent to you electronically.  As this is a virtual visit, video technology does not allow for your provider to perform a traditional examination. This may limit your provider's ability to fully assess your condition. If your provider identifies any concerns that need to be evaluated in person or the need to arrange testing (such as labs, EKG, etc.), we will make arrangements to do so. Although advances in technology are sophisticated, we cannot ensure that it will always work on either your end or our end. If the connection with a video visit is poor, the visit may have to be switched to a telephone visit. With either a video or telephone visit, we are not always able to ensure that we have a secure connection.  By engaging in this virtual visit, you consent to the provision of healthcare and authorize for your insurance to be billed (if applicable) for the services provided during this visit. Depending on your insurance coverage, you may receive a charge related to this service.  I need to obtain your verbal consent now. Are you willing to proceed with your visit today? Janice Blake has provided verbal consent on 04/19/2023 for a virtual visit (video or telephone). Roxy Horseman, PA-C  Date: 04/19/2023 10:36 AM  Virtual Visit via Video Note   I, Roxy Horseman, connected with  Janice Blake  ([id number], 1970/06/21) on 04/19/23 at 10:30 AM EST by a video-enabled telemedicine application and verified that I am speaking with the correct person using two identifiers.  Location: Patient: Virtual Visit Location  Patient: Home Provider: Virtual Visit Location Provider: Home Office   I discussed the limitations of evaluation and management by telemedicine and the availability of in person appointments. The patient expressed understanding and agreed to proceed.    History of Present Illness: Janice Blake is a 52 y.o. who identifies as a female who was assigned female at birth, and is being seen today for allergies and asthma.  States that she is feeling short of breath.  States that she had recent change in medication.  States she still has some congestion, sneezing, and throat irritation.  States that she is having some wheezing.  States that it is worse at night and when she lays down.  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   Colon cancer screening declined 04/08/2020   COVID-19 vaccination declined 04/08/2020   Influenza vaccination declined 04/08/2020   Overweight 10/15/2017   Back muscle spasm 01/22/2014   Menorrhagia with regular cycle 11/30/2013   Anemia, iron deficiency 11/30/2013   History of nasal polyp 11/30/2013   Allergic rhinitis 05/05/2013   Aspirin-exacerbated respiratory disease (AERD)     Allergies:  Allergies  Allergen Reactions   Ketorolac Shortness Of Breath    Throat swelling > given Solu Medrol + Benadryl  Amoxil [Amoxicillin] Hives and Itching   Levaquin [Levofloxacin] Hives and Itching   Medications:  Current Outpatient Medications:    predniSONE (DELTASONE) 20 MG tablet, Take 2 tablets (40 mg total) by mouth daily., Disp: 10 tablet, Rfl: 0   albuterol (PROVENTIL) (2.5 MG/3ML) 0.083% nebulizer solution, Take 3 mLs (2.5 mg total) by nebulization every 6 (six) hours as needed for  wheezing or shortness of breath., Disp: 75 mL, Rfl: 12   albuterol (VENTOLIN HFA) 108 (90 Base) MCG/ACT inhaler, Inhale 2 puffs into the lungs every 6 (six) hours as needed for wheezing or shortness of breath., Disp: 18 g, Rfl: 0   azelastine (ASTELIN) 0.1 % nasal spray, Place 1 spray into both nostrils every morning. Use in each nostril as directed, Disp: 30 mL, Rfl: 12   Budeson-Glycopyrrol-Formoterol (BREZTRI AEROSPHERE) 160-9-4.8 MCG/ACT AERO, Inhale 2 puffs into the lungs in the morning and at bedtime., Disp: 10.7 g, Rfl: 2   fluticasone (FLONASE) 50 MCG/ACT nasal spray, Place 1 spray into both nostrils at bedtime., Disp: 16 g, Rfl: 2   hydrOXYzine (ATARAX) 10 MG tablet, Take 1 tablet (10 mg total) by mouth 3 (three) times daily as needed for itching., Disp: 30 tablet, Rfl: 0   montelukast (SINGULAIR) 10 MG tablet, Take 1 tablet (10 mg total) by mouth at bedtime., Disp: 30 tablet, Rfl: 5   senna-docusate (SENOKOT-S) 8.6-50 MG tablet, Take 2 tablets by mouth 2 (two) times daily., Disp: 20 tablet, Rfl: 0  Observations/Objective: Patient is well-developed, well-nourished in no acute distress.  Resting comfortably  at home. Sounds congested. Head is normocephalic, atraumatic.  No labored breathing.  Speech is clear and coherent with logical content.  Patient is alert and oriented at baseline.    Assessment and Plan: 1. Exacerbation of asthma, unspecified asthma severity, unspecified whether persistent (Primary)   Meds ordered this encounter  Medications   predniSONE (DELTASONE) 20 MG tablet    Sig: Take 2 tablets (40 mg total) by mouth daily.    Dispense:  10 tablet    Refill:  0    Supervising Provider:   Merrilee Jansky [[id number]]     Follow Up Instructions: I discussed the assessment and treatment plan with the patient. The patient was provided an opportunity to ask questions and all were answered. The patient agreed with the plan and demonstrated an understanding of the  instructions.  A copy of instructions were sent to the patient via MyChart unless otherwise noted below.     The patient was advised to call back or seek an in-person evaluation if the symptoms worsen or if the condition fails to improve as anticipated.    Roxy Horseman, PA-C

## 2023-04-19 NOTE — Patient Instructions (Signed)
  Janice Blake, thank you for joining Roxy Horseman, PA-C for today's virtual visit.  While this provider is not your primary care provider (PCP), if your PCP is located in our provider database this encounter information will be shared with them immediately following your visit.   A [facility] MyChart account gives you access to today's visit and all your visits, tests, and labs performed at [facility] " click here if you don't have a [facility] MyChart account or go to [email or web address]  Consent: (Patient) Janice Blake provided verbal consent for this virtual visit at the beginning of the encounter.  Current Medications:  Current Outpatient Medications:    predniSONE (DELTASONE) 20 MG tablet, Take 2 tablets (40 mg total) by mouth daily., Disp: 10 tablet, Rfl: 0   albuterol (PROVENTIL) (2.5 MG/3ML) 0.083% nebulizer solution, Take 3 mLs (2.5 mg total) by nebulization every 6 (six) hours as needed for wheezing or shortness of breath., Disp: 75 mL, Rfl: 12   albuterol (VENTOLIN HFA) 108 (90 Base) MCG/ACT inhaler, Inhale 2 puffs into the lungs every 6 (six) hours as needed for wheezing or shortness of breath., Disp: 18 g, Rfl: 0   azelastine (ASTELIN) 0.1 % nasal spray, Place 1 spray into both nostrils every morning. Use in each nostril as directed, Disp: 30 mL, Rfl: 12   Budeson-Glycopyrrol-Formoterol (BREZTRI AEROSPHERE) 160-9-4.8 MCG/ACT AERO, Inhale 2 puffs into the lungs in the morning and at bedtime., Disp: 10.7 g, Rfl: 2   fluticasone (FLONASE) 50 MCG/ACT nasal spray, Place 1 spray into both nostrils at bedtime., Disp: 16 g, Rfl: 2   hydrOXYzine (ATARAX) 10 MG tablet, Take 1 tablet (10 mg total) by mouth 3 (three) times daily as needed for itching., Disp: 30 tablet, Rfl: 0   montelukast (SINGULAIR) 10 MG tablet, Take 1 tablet (10 mg total) by mouth at bedtime., Disp: 30 tablet, Rfl: 5   senna-docusate (SENOKOT-S) 8.6-50 MG tablet, Take 2 tablets by mouth 2  (two) times daily., Disp: 20 tablet, Rfl: 0   Medications ordered in this encounter:  Meds ordered this encounter  Medications   predniSONE (DELTASONE) 20 MG tablet    Sig: Take 2 tablets (40 mg total) by mouth daily.    Dispense:  10 tablet    Refill:  0    Supervising Provider:   Merrilee Jansky [[id number]]     *If you need refills on other medications prior to your next appointment, please contact your pharmacy*  Follow-Up: Call back or seek an in-person evaluation if the symptoms worsen or if the condition fails to improve as anticipated.  [facility] [phone]  Other Instructions    If you have been instructed to have an in-person evaluation today at a local Urgent Care facility, please use the link below. It will take you to a list of all of our available [facility], including address, phone number and hours of operation. Please do not delay care.  [facility]  If you or a family member do not have a primary care provider, use the link below to schedule a visit and establish care. When you choose a [facility] primary care physician or advanced practice provider, you gain a long-term partner in health. Find a Primary Care Provider  Learn more about [facility]'s in-office and virtual care options: [facility] - Get Care Now

## 2023-04-20 ENCOUNTER — Other Ambulatory Visit (HOSPITAL_COMMUNITY): Payer: Self-pay

## 2023-06-18 ENCOUNTER — Institutional Professional Consult (permissible substitution) (HOSPITAL_BASED_OUTPATIENT_CLINIC_OR_DEPARTMENT_OTHER): Payer: Medicaid Other | Admitting: Pulmonary Disease

## 2023-07-09 ENCOUNTER — Institutional Professional Consult (permissible substitution): Payer: Medicaid Other | Admitting: Internal Medicine

## 2023-07-11 ENCOUNTER — Telehealth: Admitting: Physician Assistant

## 2023-07-11 DIAGNOSIS — J4541 Moderate persistent asthma with (acute) exacerbation: Secondary | ICD-10-CM

## 2023-07-11 DIAGNOSIS — J45909 Unspecified asthma, uncomplicated: Secondary | ICD-10-CM

## 2023-07-11 MED ORDER — METHYLPREDNISOLONE 4 MG PO TBPK
ORAL_TABLET | ORAL | 0 refills | Status: DC
Start: 2023-07-11 — End: 2023-07-21

## 2023-07-11 NOTE — Patient Instructions (Signed)
  Janice Blake, thank you for joining Gilberto Better, PA-C for today's virtual visit.  While this provider is not your primary care provider (PCP), if your PCP is located in our provider database this encounter information will be shared with them immediately following your visit.   A [facility] MyChart account gives you access to today's visit and all your visits, tests, and labs performed at [facility] " click here if you don't have a [facility] MyChart account or go to [email or web address]  Consent: (Patient) Janice Blake provided verbal consent for this virtual visit at the beginning of the encounter.  Current Medications:  Current Outpatient Medications:    methylPREDNISolone (MEDROL DOSEPAK) 4 MG TBPK tablet, Take as directed, Disp: 1 each, Rfl: 0   albuterol (PROVENTIL) (2.5 MG/3ML) 0.083% nebulizer solution, Take 3 mLs (2.5 mg total) by nebulization every 6 (six) hours as needed for wheezing or shortness of breath., Disp: 75 mL, Rfl: 12   albuterol (VENTOLIN HFA) 108 (90 Base) MCG/ACT inhaler, Inhale 2 puffs into the lungs every 6 (six) hours as needed for wheezing or shortness of breath., Disp: 18 g, Rfl: 0   azelastine (ASTELIN) 0.1 % nasal spray, Place 1 spray into both nostrils every morning. Use in each nostril as directed, Disp: 30 mL, Rfl: 12   Budeson-Glycopyrrol-Formoterol (BREZTRI AEROSPHERE) 160-9-4.8 MCG/ACT AERO, Inhale 2 puffs into the lungs in the morning and at bedtime., Disp: 10.7 g, Rfl: 2   fluticasone (FLONASE) 50 MCG/ACT nasal spray, Place 1 spray into both nostrils at bedtime., Disp: 16 g, Rfl: 2   hydrOXYzine (ATARAX) 10 MG tablet, Take 1 tablet (10 mg total) by mouth 3 (three) times daily as needed for itching., Disp: 30 tablet, Rfl: 0   montelukast (SINGULAIR) 10 MG tablet, Take 1 tablet (10 mg total) by mouth at bedtime., Disp: 30 tablet, Rfl: 5   senna-docusate (SENOKOT-S) 8.6-50 MG tablet, Take 2 tablets by mouth 2 (two) times daily.,  Disp: 20 tablet, Rfl: 0   Medications ordered in this encounter:  Meds ordered this encounter  Medications   methylPREDNISolone (MEDROL DOSEPAK) 4 MG TBPK tablet    Sig: Take as directed    Dispense:  1 each    Refill:  0    Supervising Provider:   Merrilee Jansky [[id number]]     *If you need refills on other medications prior to your next appointment, please contact your pharmacy*  Follow-Up: Call back or seek an in-person evaluation if the symptoms worsen or if the condition fails to improve as anticipated.  [facility] [phone]  Other Instructions Stay well hydrated. Continue with Flonase, Zyrtec, and Singulair.  Start new medicine as prescribed. Schedule a virtual appointment if symptoms don't improve.   If you have been instructed to have an in-person evaluation today at a local Urgent Care facility, please use the link below. It will take you to a list of all of our available [facility], including address, phone number and hours of operation. Please do not delay care.  [facility]  If you or a family member do not have a primary care provider, use the link below to schedule a visit and establish care. When you choose a [facility] primary care physician or advanced practice provider, you gain a long-term partner in health. Find a Primary Care Provider  Learn more about [facility]'s in-office and virtual care options: [facility] - Get Care Now

## 2023-07-11 NOTE — Progress Notes (Signed)
 Virtual Visit Consent   Janice Blake, you are scheduled for a virtual visit with a [facility] provider today. Just as with appointments in the office, your consent must be obtained to participate. Your consent will be active for this visit and any virtual visit you may have with one of our providers in the next 365 days. If you have a MyChart account, a copy of this consent can be sent to you electronically.  As this is a virtual visit, video technology does not allow for your provider to perform a traditional examination. This may limit your provider's ability to fully assess your condition. If your provider identifies any concerns that need to be evaluated in person or the need to arrange testing (such as labs, EKG, etc.), we will make arrangements to do so. Although advances in technology are sophisticated, we cannot ensure that it will always work on either your end or our end. If the connection with a video visit is poor, the visit may have to be switched to a telephone visit. With either a video or telephone visit, we are not always able to ensure that we have a secure connection.  By engaging in this virtual visit, you consent to the provision of healthcare and authorize for your insurance to be billed (if applicable) for the services provided during this visit. Depending on your insurance coverage, you may receive a charge related to this service.  I need to obtain your verbal consent now. Are you willing to proceed with your visit today? ADELITA HONE has provided verbal consent on 07/11/2023 for a virtual visit (video or telephone). Gilberto Better, [place]  Date: 07/11/2023 7:40 PM   Virtual Visit via Video Note   I, Janice Blake, connected with  ALYSIAH SUPPA  ([id number], 1970/08/27) on 07/11/23 at  7:30 PM EDT by a video-enabled telemedicine application and verified that I am speaking with the correct person using two identifiers.  Location: Patient: Virtual Visit Location  Patient: Home Provider: Virtual Visit Location Provider: Home Office   I discussed the limitations of evaluation and management by telemedicine and the availability of in person appointments. The patient expressed understanding and agreed to proceed.    History of Present Illness: Janice Blake is a 53 y.o. who identifies as a female who was assigned female at birth, and is being seen today for asthma exacerbation due to allergies.  HPI: 53 y/o F presents via video telehealth visit for c/o sneezing, "chest rattling", rhinorrhea, nasal congestion, watery eyes x few days. Has been taking Singulair, Zyrtec, Flonase. Usually a round of tapered steroid medicine helps calm her symptoms.     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   Colon cancer screening declined 04/08/2020   COVID-19 vaccination declined 04/08/2020   Influenza vaccination declined 04/08/2020   Overweight 10/15/2017   Back muscle spasm 01/22/2014   Menorrhagia with regular cycle 11/30/2013   Anemia, iron deficiency 11/30/2013   History of nasal polyp 11/30/2013   Allergic rhinitis 05/05/2013   Aspirin-exacerbated respiratory disease (AERD)     Allergies:  Allergies  Allergen Reactions   Ketorolac Shortness Of Breath    Throat swelling > given Solu Medrol + Benadryl   Amoxil [Amoxicillin] Hives and  Itching   Levaquin [Levofloxacin] Hives and Itching   Medications:  Current Outpatient Medications:    methylPREDNISolone (MEDROL DOSEPAK) 4 MG TBPK tablet, Take as directed, Disp: 1 each, Rfl: 0   albuterol (PROVENTIL) (2.5 MG/3ML) 0.083% nebulizer solution, Take 3 mLs (2.5 mg total) by nebulization every 6 (six) hours as needed for wheezing or  shortness of breath., Disp: 75 mL, Rfl: 12   albuterol (VENTOLIN HFA) 108 (90 Base) MCG/ACT inhaler, Inhale 2 puffs into the lungs every 6 (six) hours as needed for wheezing or shortness of breath., Disp: 18 g, Rfl: 0   azelastine (ASTELIN) 0.1 % nasal spray, Place 1 spray into both nostrils every morning. Use in each nostril as directed, Disp: 30 mL, Rfl: 12   Budeson-Glycopyrrol-Formoterol (BREZTRI AEROSPHERE) 160-9-4.8 MCG/ACT AERO, Inhale 2 puffs into the lungs in the morning and at bedtime., Disp: 10.7 g, Rfl: 2   fluticasone (FLONASE) 50 MCG/ACT nasal spray, Place 1 spray into both nostrils at bedtime., Disp: 16 g, Rfl: 2   hydrOXYzine (ATARAX) 10 MG tablet, Take 1 tablet (10 mg total) by mouth 3 (three) times daily as needed for itching., Disp: 30 tablet, Rfl: 0   montelukast (SINGULAIR) 10 MG tablet, Take 1 tablet (10 mg total) by mouth at bedtime., Disp: 30 tablet, Rfl: 5   senna-docusate (SENOKOT-S) 8.6-50 MG tablet, Take 2 tablets by mouth 2 (two) times daily., Disp: 20 tablet, Rfl: 0  Observations/Objective: Patient is well-developed, well-nourished in no acute distress.  Resting comfortably  at home.  Head is normocephalic, atraumatic.  No labored breathing.  Speech is clear and coherent with logical content.  Patient is alert and oriented at baseline.    Assessment and Plan: 1. Asthma due to environmental allergies (Primary)  2. Moderate persistent asthma with exacerbation - methylPREDNISolone (MEDROL DOSEPAK) 4 MG TBPK tablet; Take as directed  Dispense: 1 each; Refill: 0  Stay well hydrated. Continue with Flonase, Zyrtec, and Singulair.  Start new medicine as prescribed. Schedule a virtual appointment if symptoms don't improve.  Pt verbalized understanding and in agreement.    Follow Up Instructions: I discussed the assessment and treatment plan with the patient. The patient was provided an opportunity to ask questions and all were answered. The patient agreed with the  plan and demonstrated an understanding of the instructions.  A copy of instructions were sent to the patient via MyChart unless otherwise noted below.   Patient has requested to receive PHI (AVS, Work Notes, etc) pertaining to this video visit through e-mail as they are currently without active MyChart. They have voiced understand that email is not considered secure and their health information could be viewed by someone other than the patient.   The patient was advised to call back or seek an in-person evaluation if the symptoms worsen or if the condition fails to improve as anticipated.    Gilberto Better, PA-C

## 2023-07-14 ENCOUNTER — Other Ambulatory Visit: Payer: Self-pay | Admitting: Student

## 2023-07-14 DIAGNOSIS — J3089 Other allergic rhinitis: Secondary | ICD-10-CM

## 2023-07-15 NOTE — Telephone Encounter (Signed)
 Patient last seen 04/02/23 no pcp listed. I called the patient to see if she will continue to receive care from [place] unable to reach her ,her mailbox is full.

## 2023-07-19 ENCOUNTER — Ambulatory Visit: Payer: Self-pay

## 2023-07-19 NOTE — Telephone Encounter (Signed)
 Chief Complaint: Cough, shortness of breath, sinus drainage Symptoms: see above Frequency: a week Pertinent Negatives: Patient denies fever Disposition: [] ED /[x] Urgent Care (no appt availability in office) / [] Appointment(In office/virtual)/ []  [facility]/ [] Home Care/ [] Refused Recommended Disposition /[] [facility]/ []  Follow-up with PCP Additional Notes: Patient does not have an established PCP but called in to determine if she could be seen today for ongoing cough, wheezing, shortness of breath, and sinus drainage for 1 week. This RN assisted patient with new patient establishment appointment but referred patient to Urgent Care for acute needs due to wheezing and shortness of breath. Patient stated she will go to [facility] on [facility] now for evaluation.   Copied from CRM [id number]. Topic: Clinical - Red Word Triage >> Jul 19, 2023 11:05 AM Dennison Nancy wrote: Red Word that prompted transfer to Nurse Triage: wheezing , having congestion chest , sinus drainage Reason for Disposition  [1] MILD difficulty breathing (e.g., minimal/no SOB at rest, SOB with walking, pulse <100) AND [2] still present when not coughing  Answer Assessment - Initial Assessment Questions 1. ONSET: "When did the cough begin?"      A week 2. SEVERITY: "How bad is the cough today?"      Constant urge to cough 3. SPUTUM: "Describe the color of your sputum" (none, dry cough; clear, white, yellow, green)     Yellow 4. HEMOPTYSIS: "Are you coughing up any blood?" If so ask: "How much?" (flecks, streaks, tablespoons, etc.)     No 5. DIFFICULTY BREATHING: "Are you having difficulty breathing?" If Yes, ask: "How bad is it?" (e.g., mild, moderate, severe)    - MILD: No SOB at rest, mild SOB with walking, speaks normally in sentences, can lie down, no retractions, pulse < 100.    - MODERATE: SOB at rest, SOB with minimal exertion and prefers to sit, cannot lie down flat, speaks in  phrases, mild retractions, audible wheezing, pulse 100-120.    - SEVERE: Very SOB at rest, speaks in single words, struggling to breathe, sitting hunched forward, retractions, pulse > 120      Moderate  6. FEVER: "Do you have a fever?" If Yes, ask: "What is your temperature, how was it measured, and when did it start?"     No 7. CARDIAC HISTORY: "Do you have any history of heart disease?" (e.g., heart attack, congestive heart failure)      No 8. LUNG HISTORY: "Do you have any history of lung disease?"  (e.g., pulmonary embolus, asthma, emphysema)     Asthma 9. PE RISK FACTORS: "Do you have a history of blood clots?" (or: recent major surgery, recent prolonged travel, bedridden)     No 10. OTHER SYMPTOMS: "Do you have any other symptoms?" (e.g., runny nose, wheezing, chest pain)       Wheezing, chest congestion, sinus drainage  Protocols used: Cough - Acute Productive-A-AH

## 2023-07-21 ENCOUNTER — Ambulatory Visit (HOSPITAL_COMMUNITY)
Admission: EM | Admit: 2023-07-21 | Discharge: 2023-07-21 | Disposition: A | Discharge status: Home / Self Care | Attending: Family Medicine | Admitting: Family Medicine

## 2023-07-21 ENCOUNTER — Encounter (HOSPITAL_COMMUNITY): Payer: Self-pay | Admitting: Emergency Medicine

## 2023-07-21 DIAGNOSIS — R0602 Shortness of breath: Secondary | ICD-10-CM

## 2023-07-21 DIAGNOSIS — J4541 Moderate persistent asthma with (acute) exacerbation: Secondary | ICD-10-CM

## 2023-07-21 MED ORDER — MONTELUKAST SODIUM 10 MG PO TABS: 10.0000 mg | ORAL_TABLET | Freq: Every day | ORAL | 0 refills | Status: DC

## 2023-07-21 MED ORDER — PROMETHAZINE-DM 6.25-15 MG/5ML PO SYRP: 5.0000 mL | ORAL_SOLUTION | Freq: Four times a day (QID) | ORAL | 0 refills | Status: DC | PRN

## 2023-07-21 MED ORDER — IPRATROPIUM-ALBUTEROL 0.5-2.5 (3) MG/3ML IN SOLN
3.0000 mL | Freq: Once | RESPIRATORY_TRACT | Status: AC
  Administered 2023-07-21: 3 mL via RESPIRATORY_TRACT

## 2023-07-21 MED ORDER — PREDNISONE 20 MG PO TABS: ORAL_TABLET | ORAL | 0 refills | Status: DC

## 2023-07-21 MED ORDER — IPRATROPIUM-ALBUTEROL 0.5-2.5 (3) MG/3ML IN SOLN
RESPIRATORY_TRACT | Status: AC
  Filled 2023-07-21: qty 3

## 2023-07-21 MED ORDER — VENTOLIN HFA 108 (90 BASE) MCG/ACT IN AERS: 2.0000 | INHALATION_SPRAY | RESPIRATORY_TRACT | 0 refills | Status: DC | PRN

## 2023-07-21 MED ORDER — MOMETASONE FURO-FORMOTEROL FUM 200-5 MCG/ACT IN AERO: 2.0000 | INHALATION_SPRAY | Freq: Two times a day (BID) | RESPIRATORY_TRACT | 0 refills | Status: DC

## 2023-07-21 MED ORDER — ALBUTEROL SULFATE (2.5 MG/3ML) 0.083% IN NEBU: 2.5000 mg | INHALATION_SOLUTION | Freq: Four times a day (QID) | RESPIRATORY_TRACT | 0 refills | Status: DC | PRN

## 2023-07-21 NOTE — ED Provider Notes (Signed)
 [facility]    CSN: [id number] Arrival date & time: 07/21/23  1407      History   Chief Complaint Chief Complaint  Patient presents with   Shortness of Breath   Medication Refill    HPI Janice Blake is a 53 y.o. female.   Patient reports that since 07/18/2023, she has been having cough, chest congestion, wheezing, shortness of breath.  She reports that she has an asthma exacerbation once or twice a year around the change of seasons.  She denies fever, nausea, vomiting, diarrhea, constipation.  She reports that she is out of some of her allergy medicine and needs refills of her albuterol inhaler.  She feels like this is absolutely simply an asthma exacerbation.  She has not had bodyaches or anything that would make her think it was flu or COVID.  She declined flu and COVID testing.   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   Asthma exacerbation 01/02/2023   Moderate persistent asthma, uncomplicated 03/23/2021   Chronic rhinitis 03/23/2021   Nasal polyps 03/23/2021   Obesity (BMI 30.0-34.9) 06/13/2020   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   Colon cancer screening declined 04/08/2020   COVID-19 vaccination declined 04/08/2020   Influenza vaccination declined 04/08/2020   Overweight 10/15/2017   Back muscle spasm 01/22/2014   Menorrhagia with regular cycle 11/30/2013   Anemia, iron deficiency 11/30/2013   History of nasal polyp 11/30/2013   Allergic rhinitis  05/05/2013   Aspirin-exacerbated respiratory disease (AERD)     Past Surgical History:  Procedure Laterality Date   FRACTURE SURGERY     NASAL POLYP SURGERY Bilateral 2011   NASAL SINUS SURGERY Bilateral 11/03/2014   Procedure: BILATERAL ENDOSCOPIC POLYPECTOMY ETHMOIDECTOMY MAXILLARY ANTROSTOMY ;  Surgeon: Serena Colonel, MD;  Location: [facility];  Service: ENT;  Laterality: Bilateral;   PATELLA FRACTURE SURGERY Right 1996   "crushed; rod w/12 screws on one side, 6 screws on the other"   TUBAL LIGATION  1994    OB History   No obstetric history on file.      Home Medications    Prior to Admission medications   Medication Sig Start Date End Date Taking? Authorizing Provider  albuterol (VENTOLIN HFA) 108 (90 Base) MCG/ACT inhaler Inhale 2 puffs into the lungs every 4 (four) hours as needed for wheezing or shortness of breath. 07/21/23  Yes Prescilla Sours, FNP  mometasone-formoterol (DULERA) 200-5 MCG/ACT AERO Inhale 2 puffs into the lungs 2 (two) times daily. 07/21/23  Yes Prescilla Sours, FNP  predniSONE (DELTASONE) 20 MG tablet Prednisone 20 mg, 2 pills once daily for 3 days then 1 pill once daily for 3 days. 07/21/23  Yes Prescilla Sours, FNP  promethazine-dextromethorphan (PROMETHAZINE-DM) 6.25-15 MG/5ML syrup Take 5 mLs by mouth 4 (four) times daily as needed for cough. 07/21/23  Yes Prescilla Sours, FNP  albuterol (PROVENTIL) (2.5 MG/3ML)  0.083% nebulizer solution Take 3 mLs (2.5 mg total) by nebulization every 6 (six) hours as needed for wheezing or shortness of breath. 07/21/23   Prescilla Sours, FNP  azelastine (ASTELIN) 0.1 % nasal spray Place 1 spray into both nostrils every morning. Use in each nostril as directed 01/13/23   Kathlen Mody, MD  fluticasone (FLONASE) 50 MCG/ACT nasal spray Place 1 spray into both nostrils at bedtime. 01/12/23   Kathlen Mody, MD  hydrOXYzine (ATARAX) 10 MG tablet Take 1 tablet (10 mg total) by mouth 3 (three) times daily as needed for itching.  04/02/23   Alexander-Savino, [place], MD  montelukast (SINGULAIR) 10 MG tablet Take 1 tablet (10 mg total) by mouth at bedtime. 07/21/23   Prescilla Sours, FNP  senna-docusate (SENOKOT-S) 8.6-50 MG tablet Take 2 tablets by mouth 2 (two) times daily. 01/12/23   Kathlen Mody, MD    Family History Family History  Problem Relation Age of Onset   Allergic rhinitis Mother    Asthma Father    Hypertension Father    Diabetes Maternal Grandmother    Hypertension Maternal Grandfather    Cancer Paternal Grandmother    Cancer Paternal Grandfather        lung cancer    Asthma Son     Social History Social History   Tobacco Use   Smoking status: Never   Smokeless tobacco: Never  Substance Use Topics   Alcohol use: Yes    Comment: occ.   Drug use: No     Allergies   Ketorolac, Amoxil [amoxicillin], and Levaquin [levofloxacin]   Review of Systems Review of Systems  Constitutional:  Negative for chills and fever.  HENT:  Negative for ear pain and sore throat.   Eyes:  Negative for pain and visual disturbance.  Respiratory:  Positive for cough, chest tightness, shortness of breath and wheezing.   Cardiovascular:  Negative for chest pain and palpitations.  Gastrointestinal:  Negative for abdominal pain, constipation, diarrhea, nausea and vomiting.  Genitourinary:  Negative for dysuria and hematuria.  Musculoskeletal:  Negative for arthralgias and back pain.  Skin:  Negative for color change and rash.  Neurological:  Negative for seizures and syncope.  All other systems reviewed and are negative.    Physical Exam Triage Vital Signs ED Triage Vitals  Encounter Vitals Group     BP 07/21/23 1531 137/84     Systolic BP Percentile --      Diastolic BP Percentile --      Pulse Rate 07/21/23 1531 68     Resp 07/21/23 1531 20     Temp 07/21/23 1531 97.9 F (36.6 C)     Temp Source 07/21/23 1531 Oral     SpO2 07/21/23 1531 96 %     Weight --      Height --      Head Circumference  --      Peak Flow --      Pain Score 07/21/23 1528 6     Pain Loc --      Pain Education --      Exclude from Growth Chart --    No data found.  Updated Vital Signs BP 137/84 (BP Location: Right Arm)   Pulse 68   Temp 97.9 F (36.6 C) (Oral)   Resp 20   SpO2 96%   Visual Acuity Right Eye Distance:   Left Eye Distance:   Bilateral Distance:    Right Eye Near:   Left Eye Near:  Bilateral Near:     Physical Exam Vitals and nursing note reviewed.  Constitutional:      General: She is not in acute distress.    Appearance: She is well-developed. She is not ill-appearing or toxic-appearing.  HENT:     Head: Normocephalic and atraumatic.     Right Ear: Hearing, tympanic membrane, ear canal and external ear normal.     Left Ear: Hearing, tympanic membrane, ear canal and external ear normal.     Nose: No congestion or rhinorrhea.     Right Sinus: No maxillary sinus tenderness or frontal sinus tenderness.     Left Sinus: No maxillary sinus tenderness or frontal sinus tenderness.     Mouth/Throat:     Lips: Pink.     Mouth: Mucous membranes are moist.     Pharynx: Uvula midline. No oropharyngeal exudate or posterior oropharyngeal erythema.     Tonsils: No tonsillar exudate.  Eyes:     Conjunctiva/sclera: Conjunctivae normal.     Pupils: Pupils are equal, round, and reactive to light.  Cardiovascular:     Rate and Rhythm: Normal rate and regular rhythm.     Heart sounds: S1 normal and S2 normal. No murmur heard. Pulmonary:     Effort: Pulmonary effort is normal. No respiratory distress.     Breath sounds: Examination of the right-upper field reveals wheezing. Examination of the left-upper field reveals wheezing. Examination of the right-middle field reveals wheezing. Examination of the left-middle field reveals wheezing. Examination of the right-lower field reveals wheezing. Examination of the left-lower field reveals wheezing. Wheezing present. No decreased breath sounds,  rhonchi or rales.  Abdominal:     General: Bowel sounds are normal.     Palpations: Abdomen is soft.     Tenderness: There is no abdominal tenderness.  Musculoskeletal:        General: No swelling.     Cervical back: Neck supple.  Lymphadenopathy:     Head:     Right side of head: No submental, submandibular, tonsillar, preauricular or posterior auricular adenopathy.     Left side of head: No submental, submandibular, tonsillar, preauricular or posterior auricular adenopathy.     Cervical: No cervical adenopathy.     Right cervical: No superficial cervical adenopathy.    Left cervical: No superficial cervical adenopathy.  Skin:    General: Skin is warm and dry.     Capillary Refill: Capillary refill takes less than 2 seconds.     Findings: No rash.  Neurological:     Mental Status: She is alert and oriented to person, place, and time.  Psychiatric:        Mood and Affect: Mood normal.      UC Treatments / Results  Labs (all labs ordered are listed, but only abnormal results are displayed) Labs Reviewed - No data to display  EKG   Radiology No results found.  Procedures Procedures (including critical care time)  Medications Ordered in UC Medications  ipratropium-albuterol (DUONEB) 0.5-2.5 (3) MG/3ML nebulizer solution 3 mL (3 mLs Nebulization Given 07/21/23 1610)    Initial Impression / Assessment and Plan / UC Course  I have reviewed the triage vital signs and the nursing notes.  Pertinent labs & imaging results that were available during my care of the patient were reviewed by me and considered in my medical decision making (see chart for details).     Plan of Care:  See below for specific patient instructions. Asthma exacerbation: Prednisone, 20 mg number  2 pills daily for 3 days then 1 pill daily for 3 days.  Refilled Dulera and albuterol inhalers, montelukast, albuterol nebulizer solution.  Use as previously prescribed.  Promethazine DM, 5 mL, every 6 hours  as needed for cough.  Get plenty of fluids and rest. Follow-up with PCP regarding asthma later this week as planned. Final Clinical Impressions(s) / UC Diagnoses   Final diagnoses:  Moderate persistent asthma with exacerbation  Shortness of breath     Discharge Instructions      Some improvement with DuoNeb nebulizer treatment.  Renewed her inhalers (Albuterol and Dulera) and the nebulizer solution for home nebulizer treatments.  Provided Promethazine DM, 5 mL every 6 hours as needed for cough.  Refilled montelukast, 10 mg daily for allergies and asthma.  Prednisone 20 mg number 2 pills daily for 3 days then number 1 pill daily for 3 days.  Follow-up if symptoms do not improve, worsen or new symptoms occur.  She reports she has an appointment in this week with her PCP about general chronic conditions and asthma.     ED Prescriptions     Medication Sig Dispense Auth. Provider   predniSONE (DELTASONE) 20 MG tablet Prednisone 20 mg, 2 pills once daily for 3 days then 1 pill once daily for 3 days. 9 tablet Prescilla Sours, FNP   mometasone-formoterol (DULERA) 200-5 MCG/ACT AERO Inhale 2 puffs into the lungs 2 (two) times daily. 13 g Prescilla Sours, FNP   albuterol (VENTOLIN HFA) 108 (90 Base) MCG/ACT inhaler Inhale 2 puffs into the lungs every 4 (four) hours as needed for wheezing or shortness of breath. 1 each Prescilla Sours, FNP   albuterol (PROVENTIL) (2.5 MG/3ML) 0.083% nebulizer solution Take 3 mLs (2.5 mg total) by nebulization every 6 (six) hours as needed for wheezing or shortness of breath. 75 mL Prescilla Sours, FNP   montelukast (SINGULAIR) 10 MG tablet Take 1 tablet (10 mg total) by mouth at bedtime. 30 tablet Prescilla Sours, FNP   promethazine-dextromethorphan (PROMETHAZINE-DM) 6.25-15 MG/5ML syrup Take 5 mLs by mouth 4 (four) times daily as needed for cough. 118 mL Prescilla Sours, FNP      PDMP not reviewed this encounter.   Prescilla Sours, FNP 07/21/23 [phone]

## 2023-07-21 NOTE — Discharge Instructions (Addendum)
 Some improvement with DuoNeb nebulizer treatment.  Renewed her inhalers (Albuterol and Dulera) and the nebulizer solution for home nebulizer treatments.  Provided Promethazine DM, 5 mL every 6 hours as needed for cough.  Refilled montelukast, 10 mg daily for allergies and asthma.  Prednisone 20 mg number 2 pills daily for 3 days then number 1 pill daily for 3 days.  Follow-up if symptoms do not improve, worsen or new symptoms occur.  She reports she has an appointment in this week with her PCP about general chronic conditions and asthma.

## 2023-07-21 NOTE — ED Triage Notes (Addendum)
 Pt reports for past 3 days having asthma flare up. Reports SOB and "rattling in chest". Using inhalers and neb treatments.  Needing refills on Albuterol solution Montelukast

## 2023-07-23 ENCOUNTER — Ambulatory Visit: Payer: Self-pay | Admitting: Dietician

## 2023-07-31 ENCOUNTER — Encounter: Admitting: Student

## 2023-08-06 ENCOUNTER — Ambulatory Visit: Admitting: Student

## 2023-08-06 VITALS — BP 124/75 | HR 80 | Temp 98.1°F | Ht 69.0 in | Wt 241.5 lb

## 2023-08-06 DIAGNOSIS — J301 Allergic rhinitis due to pollen: Secondary | ICD-10-CM

## 2023-08-06 DIAGNOSIS — J4489 Other specified chronic obstructive pulmonary disease: Secondary | ICD-10-CM

## 2023-08-06 DIAGNOSIS — J309 Allergic rhinitis, unspecified: Secondary | ICD-10-CM

## 2023-08-06 MED ORDER — SPIRIVA RESPIMAT 2.5 MCG/ACT IN AERS: 2.0000 | INHALATION_SPRAY | Freq: Every day | RESPIRATORY_TRACT | 2 refills | Status: DC

## 2023-08-06 MED ORDER — PREDNISONE 20 MG PO TABS: 40.0000 mg | ORAL_TABLET | Freq: Every day | ORAL | 0 refills | Status: AC

## 2023-08-06 MED ORDER — ALBUTEROL SULFATE HFA 108 (90 BASE) MCG/ACT IN AERS: 2.0000 | INHALATION_SPRAY | RESPIRATORY_TRACT | 6 refills | Status: DC | PRN

## 2023-08-06 MED ORDER — MONTELUKAST SODIUM 10 MG PO TABS: 10.0000 mg | ORAL_TABLET | Freq: Every day | ORAL | 2 refills | Status: DC

## 2023-08-06 MED ORDER — MOMETASONE FURO-FORMOTEROL FUM 200-5 MCG/ACT IN AERO: 2.0000 | INHALATION_SPRAY | Freq: Two times a day (BID) | RESPIRATORY_TRACT | 6 refills | Status: DC

## 2023-08-06 MED ORDER — ALBUTEROL SULFATE (2.5 MG/3ML) 0.083% IN NEBU: 2.5000 mg | INHALATION_SOLUTION | Freq: Four times a day (QID) | RESPIRATORY_TRACT | 2 refills | Status: DC | PRN

## 2023-08-06 NOTE — Progress Notes (Unsigned)
   Established Patient Office Visit  Subjective   Patient ID: SYVANNA CIOLINO, female    DOB: April 03, 1971  Age: 53 y.o. MRN: [id number]  No chief complaint on file.   HPI This is a 53 year old female living with a history stated below and presents today for follow up on COPD after starting Breztri. Please see problem based assessment and plan for additional details.    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 BP Readings from Last 3 Encounters:  07/21/23 137/84  04/02/23 127/76  01/12/23 124/70   Wt Readings from Last 3 Encounters:  04/02/23 238 lb 8 oz (108.2 kg)  01/02/23 188 lb (85.3 kg)  12/28/22 188 lb 15 oz (85.7 kg)   SpO2 Readings from Last 3 Encounters:  07/21/23 96%  04/02/23 98%  01/12/23 94%      Physical Exam   No results found for any visits on 08/06/23.  {Labs (Optional):23779}  The ASCVD Risk score (Arnett DK, et al., 2019) failed to calculate for the following reasons:   Cannot find a previous HDL lab   Cannot find a previous total cholesterol lab    Assessment & Plan:   ASTHMA    Pulmonary function report 06/19/2022:   FVC 2.12 L ( 61%), FEV1 1.21 L ( 44%). Spirometry indicates severe airway obstruction. Post bronchodilator response shows FVC 2.25 L ( 65%), FEV1 1.24 L (45%). There is a 2 % change in FEV1   She was started on Breztri per the last OV 04/02/2023, wants Dulera inhale r Elevated A-1 Antitrypsin noted on 03/23/2021  Pulmonology is attempting to reach out to patient to schedule an appointment  - Recently seen UC 3/30  for asthma exacerbation, given Medrol dose pack, with refills on Dulera and albuterol inhalers,  montelukast, albuterol nebulizer solution and Promethazine DM, 5 mL, for cough.   - May 20th, 2025  - PE: Increase expiratory wheeze with some crackles;  -    Seasonal Allergies Started March Sxs: Runny nose, water eyes, sneezing, intermittent chest rattling when sleeping - Takes Flonase and Zyrtec 10 mg at night  -  - Claritin and Allegra did not work    Dulera, Singulair, albuterol neb solution, budesonide Uses [employer]   Problem List Items Addressed This Visit   None   No follow-ups on file.    Lanney Pitts, DO

## 2023-08-06 NOTE — Patient Instructions (Addendum)
 Thank you, Ms.Samaia L Valtierra for allowing us  to provide your care today. Today we discussed:  Continue your inhalers  Start Spiriva inhaler with addition to your other inhalers daily  Start Prednisone 40 mg, one tablet daily for 5 days   I have ordered the following labs for you:  Lab Orders  No laboratory test(s) ordered today     Tests ordered today:  None   Referrals ordered today:   Referral Orders  No referral(s) requested today     I have ordered the following medication/changed the following medications:   Stop the following medications: Medications Discontinued During This Encounter  Medication Reason   predniSONE (DELTASONE) 20 MG tablet    mometasone-formoterol (DULERA) 200-5 MCG/ACT AERO Reorder   albuterol (VENTOLIN HFA) 108 (90 Base) MCG/ACT inhaler Reorder   albuterol (PROVENTIL) (2.5 MG/3ML) 0.083% nebulizer solution Reorder   montelukast (SINGULAIR) 10 MG tablet Reorder     Start the following medications: Meds ordered this encounter  Medications   albuterol (PROVENTIL) (2.5 MG/3ML) 0.083% nebulizer solution    Sig: Take 3 mLs (2.5 mg total) by nebulization every 6 (six) hours as needed for wheezing or shortness of breath.    Dispense:  75 mL    Refill:  2   albuterol (VENTOLIN HFA) 108 (90 Base) MCG/ACT inhaler    Sig: Inhale 2 puffs into the lungs every 4 (four) hours as needed for wheezing or shortness of breath.    Dispense:  1 each    Refill:  6   mometasone-formoterol (DULERA) 200-5 MCG/ACT AERO    Sig: Inhale 2 puffs into the lungs 2 (two) times daily.    Dispense:  13 g    Refill:  6   montelukast (SINGULAIR) 10 MG tablet    Sig: Take 1 tablet (10 mg total) by mouth at bedtime.    Dispense:  90 tablet    Refill:  2   Tiotropium Bromide Monohydrate (SPIRIVA RESPIMAT) 2.5 MCG/ACT AERS    Sig: Inhale 2 puffs into the lungs daily.    Dispense:  4 g    Refill:  2   predniSONE (DELTASONE) 20 MG tablet    Sig: Take 2 tablets (40 mg  total) by mouth daily with breakfast for 5 days.    Dispense:  10 tablet    Refill:  0     Follow up: 4-6 months for COPD/asthma    Remember:   Should you have any questions or concerns please call the internal medicine clinic at [phone].     Lanney Pitts, DO [facility]

## 2023-08-07 NOTE — Assessment & Plan Note (Signed)
 PFT 06/19/2022 with FEV1/FVC ratio 0.55; component of airway obstruction wo improvement with bronchodilator.  Presents today with symptoms of increasing shortness of breath, productive cough with sputum production and changes in color with intermittent green/yellow.  Reports that Janice Blake has had multiple of urgent care visits, reports that her symptoms initially improve that first week however returns back.  Physical exam showed increased expiratory wheeze with some crackles noted at the bibasilar lung fields.  Janice Blake is presenting with COPD exacerbation, will treated accordingly. -Start prednisone 40 mg daily for 5 days -Continue inhaler Dulera and albuterol -Start Spiriva inhaler daily (LAMA) -Follow-up with the pulmonologist on Sep 10, 2023

## 2023-08-07 NOTE — Progress Notes (Signed)
 Internal Medicine Clinic Attending  Case discussed with the resident at the time of the visit.  We reviewed the resident's history and exam and pertinent patient test results.  I agree with the assessment, diagnosis, and plan of care documented in the resident's note.

## 2023-08-07 NOTE — Assessment & Plan Note (Signed)
 Patient reports that since the start of March, she has the symptoms of runny nose, watery eyes, intermittent chest rattling when she is sleeping, and sneezing.  He reports that she takes Flonase and Zyrtec 10 mg at night.  Reports that Claritin and Allegra did not work.  States that she has bad allergies, and needs refills on her inhalers.  Patient was advised to continue taking Flonase and Zyrtec.  She was advised to wear mask when she is going outside.  She was also advised that she can buy over-the-counter antihistamine eyedrops to help with eye itching.

## 2023-09-10 ENCOUNTER — Ambulatory Visit (HOSPITAL_BASED_OUTPATIENT_CLINIC_OR_DEPARTMENT_OTHER): Payer: Medicaid Other | Admitting: Pulmonary Disease

## 2023-09-15 ENCOUNTER — Telehealth: Payer: Self-pay | Admitting: Nurse Practitioner

## 2023-09-15 DIAGNOSIS — J0101 Acute recurrent maxillary sinusitis: Secondary | ICD-10-CM

## 2023-09-15 NOTE — Progress Notes (Addendum)
 Virtual Visit Consent   Janice Blake, you are scheduled for a virtual visit with a [place] [facility] provider today. Just as with appointments in the office, your consent must be obtained to participate. Your consent will be active for this visit and any virtual visit you may have with one of our providers in the next 365 days. If you have a MyChart account, a copy of this consent can be sent to you electronically.  As this is a virtual visit, video technology does not allow for your provider to perform a traditional examination. This may limit your provider's ability to fully assess your condition. If your provider identifies any concerns that need to be evaluated in person or the need to arrange testing (such as labs, EKG, etc.), we will make arrangements to do so. Although advances in technology are sophisticated, we cannot ensure that it will always work on either your end or our end. If the connection with a video visit is poor, the visit may have to be switched to a telephone visit. With either a video or telephone visit, we are not always able to ensure that we have a secure connection.  By engaging in this virtual visit, you consent to the provision of healthcare and authorize for your insurance to be billed (if applicable) for the services provided during this visit. Depending on your insurance coverage, you may receive a charge related to this service.  I need to obtain your verbal consent now. Are you willing to proceed with your visit today? Janice Blake has provided verbal consent on 09/15/2023 for a virtual visit (video or telephone). Janice Dean, NP  Date: 09/15/2023 1:24 PM   Virtual Visit via Video Note   I, Janice Blake, connected with  Janice Blake  ([id number], Sep 08, 1970) on 09/15/23 at  1:00 PM EDT by a video-enabled telemedicine application and verified that I am speaking with the correct person using two identifiers.  Location: Patient: Virtual Visit Location  Patient: Home Provider: Virtual Visit Location Provider: Home Office   I discussed the limitations of evaluation and management by telemedicine and the availability of in person appointments. The patient expressed understanding and agreed to proceed.    History of Present Illness: Janice Blake is a 53 y.o. who identifies as a female who was assigned female at birth, and is being seen today for possible sinusitis.   Janice Blake states she feels she needs prednisone  for her allergy symptoms that are not controlled with her antihistamine. Symptoms include runny nose, nasal congestion, sinus pressure in her face and frontal pressure. Onset of symptoms over 1 week ago. She states steroid nasal sprays do not work for her as she has nasal polyps that need to be removed.  She does not endorse wheezing or shortness of breath and is speaking in clear sentences with no increased resp. Effort.     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   Colon cancer screening declined 04/08/2020   COVID-19 vaccination declined 04/08/2020   Influenza vaccination declined 04/08/2020   Overweight 10/15/2017   Back muscle spasm 01/22/2014   Menorrhagia with regular cycle 11/30/2013   Anemia, iron deficiency 11/30/2013   History of nasal polyp 11/30/2013  Allergic rhinitis 05/05/2013   Aspirin-exacerbated respiratory disease (AERD)     Allergies:  Allergies  Allergen Reactions   Ketorolac  Shortness Of Breath    Throat swelling > given Solu Medrol  + Benadryl    Amoxil [Amoxicillin] Hives and Itching   Levaquin [Levofloxacin] Hives and Itching   Medications:  Current Outpatient Medications:    albuterol  (PROVENTIL ) (2.5 MG/3ML) 0.083%  nebulizer solution, Take 3 mLs (2.5 mg total) by nebulization every 6 (six) hours as needed for wheezing or shortness of breath., Disp: 75 mL, Rfl: 2   albuterol  (VENTOLIN  HFA) 108 (90 Base) MCG/ACT inhaler, Inhale 2 puffs into the lungs every 4 (four) hours as needed for wheezing or shortness of breath., Disp: 1 each, Rfl: 6   azelastine  (ASTELIN ) 0.1 % nasal spray, Place 1 spray into both nostrils every morning. Use in each nostril as directed, Disp: 30 mL, Rfl: 12   fluticasone  (FLONASE ) 50 MCG/ACT nasal spray, Place 1 spray into both nostrils at bedtime., Disp: 16 g, Rfl: 2   hydrOXYzine  (ATARAX ) 10 MG tablet, TAKE 1 TABLET BY MOUTH THREE TIMES DAILY AS NEEDED FOR ITCHING, Disp: 30 tablet, Rfl: 0   mometasone -formoterol  (DULERA ) 200-5 MCG/ACT AERO, Inhale 2 puffs into the lungs 2 (two) times daily., Disp: 13 g, Rfl: 6   montelukast  (SINGULAIR ) 10 MG tablet, Take 1 tablet (10 mg total) by mouth at bedtime., Disp: 90 tablet, Rfl: 2   promethazine -dextromethorphan (PROMETHAZINE -DM) 6.25-15 MG/5ML syrup, Take 5 mLs by mouth 4 (four) times daily as needed for cough., Disp: 118 mL, Rfl: 0   senna-docusate (SENOKOT-S) 8.6-50 MG tablet, Take 2 tablets by mouth 2 (two) times daily., Disp: 20 tablet, Rfl: 0   Tiotropium Bromide Monohydrate  (SPIRIVA  RESPIMAT) 2.5 MCG/ACT AERS, Inhale 2 puffs into the lungs daily., Disp: 4 g, Rfl: 2  Observations/Objective: Patient is well-developed, well-nourished in no acute distress.  Resting comfortably at home.  Head is normocephalic, atraumatic.  No labored breathing.  Speech is clear and coherent with logical content.  Patient is alert and oriented at baseline.    Assessment and Plan: 1. Acute recurrent maxillary sinusitis (Primary) I explained to Ms .Paxson that I do not feel comfortable continuing to prescribe prednisone . Her last dose of prednisone  was 30 days ago and she has had numerous prescriptions over the past several months for steroids. I offered to  treat a possible sinus infection and she declines. States she knows what works best for her body. She states she will make an appointment with her PCP next week.  Follow Up Instructions: I discussed the assessment and treatment plan with the patient. The patient was provided an opportunity to ask questions and all were answered. The patient agreed with the plan and demonstrated an understanding of the instructions.  A copy of instructions were sent to the patient via MyChart unless otherwise noted below.    The patient was advised to call back or seek an in-person evaluation if the symptoms worsen or if the condition fails to improve as anticipated.    Jaquae Rieves W Rilea Arutyunyan, NP

## 2023-09-15 NOTE — Patient Instructions (Addendum)
  Janice Blake, thank you for joining Collins Dean, NP for today's virtual visit.  While this provider is not your primary care provider (PCP), if your PCP is located in our provider database this encounter information will be shared with them immediately following your visit.   A [facility] MyChart account gives you access to today's visit and all your visits, tests, and labs performed at [facility] " click here if you don't have a [facility] MyChart account or go to [email or web address]  Consent: (Patient) Janice Blake provided verbal consent for this virtual visit at the beginning of the encounter.  Current Medications:  Current Outpatient Medications:    albuterol  (PROVENTIL ) (2.5 MG/3ML) 0.083% nebulizer solution, Take 3 mLs (2.5 mg total) by nebulization every 6 (six) hours as needed for wheezing or shortness of breath., Disp: 75 mL, Rfl: 2   albuterol  (VENTOLIN  HFA) 108 (90 Base) MCG/ACT inhaler, Inhale 2 puffs into the lungs every 4 (four) hours as needed for wheezing or shortness of breath., Disp: 1 each, Rfl: 6   azelastine  (ASTELIN ) 0.1 % nasal spray, Place 1 spray into both nostrils every morning. Use in each nostril as directed, Disp: 30 mL, Rfl: 12   fluticasone  (FLONASE ) 50 MCG/ACT nasal spray, Place 1 spray into both nostrils at bedtime., Disp: 16 g, Rfl: 2   hydrOXYzine  (ATARAX ) 10 MG tablet, TAKE 1 TABLET BY MOUTH THREE TIMES DAILY AS NEEDED FOR ITCHING, Disp: 30 tablet, Rfl: 0   mometasone -formoterol  (DULERA ) 200-5 MCG/ACT AERO, Inhale 2 puffs into the lungs 2 (two) times daily., Disp: 13 g, Rfl: 6   montelukast  (SINGULAIR ) 10 MG tablet, Take 1 tablet (10 mg total) by mouth at bedtime., Disp: 90 tablet, Rfl: 2   promethazine -dextromethorphan (PROMETHAZINE -DM) 6.25-15 MG/5ML syrup, Take 5 mLs by mouth 4 (four) times daily as needed for cough., Disp: 118 mL, Rfl: 0   senna-docusate (SENOKOT-S) 8.6-50 MG tablet, Take 2 tablets by mouth 2 (two) times  daily., Disp: 20 tablet, Rfl: 0   Tiotropium Bromide Monohydrate  (SPIRIVA  RESPIMAT) 2.5 MCG/ACT AERS, Inhale 2 puffs into the lungs daily., Disp: 4 g, Rfl: 2   Medications ordered in this encounter:  No orders of the defined types were placed in this encounter.    *If you need refills on other medications prior to your next appointment, please contact your pharmacy*  Follow-Up: Call back or seek an in-person evaluation if the symptoms worsen or if the condition fails to improve as anticipated.  [facility] [phone]    If you have been instructed to have an in-person evaluation today at a local Urgent Care facility, please use the link below. It will take you to a list of all of our available [facility], including address, phone number and hours of operation. Please do not delay care.  [facility]  If you or a family member do not have a primary care provider, use the link below to schedule a visit and establish care. When you choose a [facility] primary care physician or advanced practice provider, you gain a long-term partner in health. Find a Primary Care Provider  Learn more about [facility] in-office and virtual care options: [facility] - Get Care Now

## 2023-09-22 ENCOUNTER — Telehealth: Payer: Self-pay | Admitting: Family

## 2023-09-22 ENCOUNTER — Encounter: Payer: Self-pay | Admitting: Nurse Practitioner

## 2023-09-22 DIAGNOSIS — J4551 Severe persistent asthma with (acute) exacerbation: Secondary | ICD-10-CM

## 2023-09-22 DIAGNOSIS — J4520 Mild intermittent asthma, uncomplicated: Secondary | ICD-10-CM

## 2023-09-22 DIAGNOSIS — J4489 Other specified chronic obstructive pulmonary disease: Secondary | ICD-10-CM

## 2023-09-22 MED ORDER — PREDNISONE 10 MG PO TABS
ORAL_TABLET | ORAL | 0 refills | Status: DC
Start: 2023-09-22 — End: 2023-11-24

## 2023-09-22 MED ORDER — PROMETHAZINE-DM 6.25-15 MG/5ML PO SYRP: 5.0000 mL | ORAL_SOLUTION | Freq: Four times a day (QID) | ORAL | 0 refills | Status: DC | PRN

## 2023-09-22 NOTE — Progress Notes (Signed)
 Virtual Visit Consent   Janice Blake, you are scheduled for a virtual visit with a [facility] provider today. Just as with appointments in the office, your consent must be obtained to participate. Your consent will be active for this visit and any virtual visit you may have with one of our providers in the next 365 days. If you have a MyChart account, a copy of this consent can be sent to you electronically.  As this is a virtual visit, video technology does not allow for your provider to perform a traditional examination. This may limit your provider's ability to fully assess your condition. If your provider identifies any concerns that need to be evaluated in person or the need to arrange testing (such as labs, EKG, etc.), we will make arrangements to do so. Although advances in technology are sophisticated, we cannot ensure that it will always work on either your end or our end. If the connection with a video visit is poor, the visit may have to be switched to a telephone visit. With either a video or telephone visit, we are not always able to ensure that we have a secure connection.  By engaging in this virtual visit, you consent to the provision of healthcare and authorize for your insurance to be billed (if applicable) for the services provided during this visit. Depending on your insurance coverage, you may receive a charge related to this service.  I need to obtain your verbal consent now. Are you willing to proceed with your visit today? Janice Blake has provided verbal consent on 09/22/2023 for a virtual visit (video or telephone). Tommas Fragmin, FNP  Date: 09/22/2023 7:36 PM   Virtual Visit via Video Note   I, Tommas Fragmin, connected with  Janice Blake  ([id number], 11/06/1970) on 09/22/23 at  7:30 PM EDT by a video-enabled telemedicine application and verified that I am speaking with the correct person using two identifiers.  Location: Patient: Virtual Visit Location  Patient: Home Provider: Virtual Visit Location Provider: Home Office   I discussed the limitations of evaluation and management by telemedicine and the availability of in person appointments. The patient expressed understanding and agreed to proceed.    History of Present Illness: Janice Blake is a 53 y.o. who identifies as a female who was assigned female at birth, and is being seen today for wheezing and asthma flare up. She is using dulera , singular, albuterol , and spiriva  with mild relief.  She has an appointment with Pulmonary on 09/23/23.  HPI: Asthma She complains of chest tightness, cough, shortness of breath and wheezing. There is no difficulty breathing. This is a chronic problem. The current episode started more than 1 year ago. The problem occurs constantly. The problem has been gradually worsening. The cough is non-productive. Associated symptoms include malaise/fatigue. Her symptoms are aggravated by pollen. Her symptoms are alleviated by rest and OTC cough suppressant. Her past medical history is significant for asthma.    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   Colon cancer screening declined 04/08/2020   COVID-19 vaccination declined 04/08/2020   Influenza vaccination declined 04/08/2020   Overweight 10/15/2017   Back muscle spasm 01/22/2014   Menorrhagia with regular cycle 11/30/2013   Anemia,  iron deficiency 11/30/2013   History of nasal polyp 11/30/2013   Allergic rhinitis 05/05/2013   Aspirin-exacerbated respiratory disease (AERD)     Allergies:  Allergies  Allergen Reactions   Ketorolac  Shortness Of Breath    Throat swelling > given Solu Medrol  + Benadryl    Amoxil [Amoxicillin] Hives  and Itching   Levaquin [Levofloxacin] Hives and Itching   Medications:  Current Outpatient Medications:    predniSONE  (DELTASONE ) 10 MG tablet, Days 1-4 take 4 tablets (40 mg) daily  Days 5-8 take 3 tablets (30 mg) daily, Days 9-11 take 2 tablets (20 mg) daily, Days 12-14 take 1 tablet (10 mg) daily., Disp: 37 tablet, Rfl: 0   albuterol  (PROVENTIL ) (2.5 MG/3ML) 0.083% nebulizer solution, Take 3 mLs (2.5 mg total) by nebulization every 6 (six) hours as needed for wheezing or shortness of breath., Disp: 75 mL, Rfl: 2   albuterol  (VENTOLIN  HFA) 108 (90 Base) MCG/ACT inhaler, Inhale 2 puffs into the lungs every 4 (four) hours as needed for wheezing or shortness of breath., Disp: 1 each, Rfl: 6   azelastine  (ASTELIN ) 0.1 % nasal spray, Place 1 spray into both nostrils every morning. Use in each nostril as directed, Disp: 30 mL, Rfl: 12   fluticasone  (FLONASE ) 50 MCG/ACT nasal spray, Place 1 spray into both nostrils at bedtime., Disp: 16 g, Rfl: 2   hydrOXYzine  (ATARAX ) 10 MG tablet, TAKE 1 TABLET BY MOUTH THREE TIMES DAILY AS NEEDED FOR ITCHING, Disp: 30 tablet, Rfl: 0   mometasone -formoterol  (DULERA ) 200-5 MCG/ACT AERO, Inhale 2 puffs into the lungs 2 (two) times daily., Disp: 13 g, Rfl: 6   montelukast  (SINGULAIR ) 10 MG tablet, Take 1 tablet (10 mg total) by mouth at bedtime., Disp: 90 tablet, Rfl: 2   promethazine -dextromethorphan (PROMETHAZINE -DM) 6.25-15 MG/5ML syrup, Take 5 mLs by mouth 4 (four) times daily as needed for cough., Disp: 118 mL, Rfl: 0   senna-docusate (SENOKOT-S) 8.6-50 MG tablet, Take 2 tablets by mouth 2 (two) times daily., Disp: 20 tablet, Rfl: 0   Tiotropium Bromide Monohydrate  (SPIRIVA  RESPIMAT) 2.5 MCG/ACT AERS, Inhale 2 puffs into the lungs daily., Disp: 4 g, Rfl: 2  Observations/Objective: Patient is well-developed, well-nourished in no acute distress.  Resting comfortably  at home.  Head is normocephalic, atraumatic.  No labored breathing.  Speech is clear and coherent  with logical content.  Patient is alert and oriented at baseline.  Wheezing, dry cough  Assessment and Plan: 1. COPD with asthma (HCC) (Primary) - predniSONE  (DELTASONE ) 10 MG tablet; Days 1-4 take 4 tablets (40 mg) daily  Days 5-8 take 3 tablets (30 mg) daily, Days 9-11 take 2 tablets (20 mg) daily, Days 12-14 take 1 tablet (10 mg) daily.  Dispense: 37 tablet; Refill: 0 - promethazine -dextromethorphan (PROMETHAZINE -DM) 6.25-15 MG/5ML syrup; Take 5 mLs by mouth 4 (four) times daily as needed for cough.  Dispense: 118 mL; Refill: 0  2. Severe persistent asthma with exacerbation - predniSONE  (DELTASONE ) 10 MG tablet; Days 1-4 take 4 tablets (40 mg) daily  Days 5-8 take 3 tablets (30 mg) daily, Days 9-11 take 2 tablets (20 mg) daily, Days 12-14 take 1 tablet (10 mg) daily.  Dispense: 37 tablet; Refill: 0 - promethazine -dextromethorphan (PROMETHAZINE -DM) 6.25-15 MG/5ML syrup; Take 5 mLs by mouth 4 (four) times daily as needed for cough.  Dispense: 118 mL; Refill: 0  Will give prednisone  and refill cough medication - Take meds as prescribed - Use a cool mist humidifier  -Use saline nose sprays frequently -Force fluids -For any  cough or congestion  Use plain Mucinex - regular strength or max strength is fine -For fever or aces or pains- take tylenol  or ibuprofen . -Throat lozenges if help -Keep follow up with Pulmonary  Go to ED if SOB worsens  Follow Up Instructions: I discussed the assessment and treatment plan with the patient. The patient was provided an opportunity to ask questions and all were answered. The patient agreed with the plan and demonstrated an understanding of the instructions.  A copy of instructions were sent to the patient via MyChart unless otherwise noted below.     The patient was advised to call back or seek an in-person evaluation if the symptoms worsen or if the condition fails to improve as anticipated.    Tommas Fragmin, FNP

## 2023-09-22 NOTE — Progress Notes (Signed)
 I had declined prescribing Janice Blake Prednisone  last week and she stated she would follow up with her PCP  As of today she has not made an appointment. She has an appt with pulmonology tomorrow.  When I logged into the video visit today she left the screen and did not return.

## 2023-09-23 ENCOUNTER — Ambulatory Visit (HOSPITAL_BASED_OUTPATIENT_CLINIC_OR_DEPARTMENT_OTHER): Admitting: Pulmonary Disease

## 2023-09-23 ENCOUNTER — Encounter (HOSPITAL_BASED_OUTPATIENT_CLINIC_OR_DEPARTMENT_OTHER): Payer: Self-pay | Admitting: Pulmonary Disease

## 2023-10-08 ENCOUNTER — Encounter: Admitting: Family

## 2023-10-08 NOTE — Progress Notes (Signed)
 Erroneous encounter-disregard

## 2023-10-24 ENCOUNTER — Ambulatory Visit (HOSPITAL_COMMUNITY): Payer: Self-pay | Admitting: Internal Medicine

## 2023-10-24 ENCOUNTER — Ambulatory Visit (INDEPENDENT_AMBULATORY_CARE_PROVIDER_SITE_OTHER)

## 2023-10-24 ENCOUNTER — Other Ambulatory Visit: Payer: Self-pay

## 2023-10-24 ENCOUNTER — Encounter (HOSPITAL_COMMUNITY): Payer: Self-pay | Admitting: Emergency Medicine

## 2023-10-24 ENCOUNTER — Ambulatory Visit (HOSPITAL_COMMUNITY)
Admission: EM | Admit: 2023-10-24 | Discharge: 2023-10-24 | Disposition: A | Discharge status: Home / Self Care | Attending: Internal Medicine | Admitting: Internal Medicine

## 2023-10-24 DIAGNOSIS — R0602 Shortness of breath: Secondary | ICD-10-CM

## 2023-10-24 DIAGNOSIS — J4551 Severe persistent asthma with (acute) exacerbation: Secondary | ICD-10-CM

## 2023-10-24 DIAGNOSIS — J4521 Mild intermittent asthma with (acute) exacerbation: Secondary | ICD-10-CM | POA: Diagnosis not present

## 2023-10-24 MED ORDER — METHYLPREDNISOLONE SODIUM SUCC 125 MG IJ SOLR
125.0000 mg | Freq: Once | INTRAMUSCULAR | Status: AC
  Administered 2023-10-24: 125 mg via INTRAMUSCULAR

## 2023-10-24 MED ORDER — IPRATROPIUM-ALBUTEROL 0.5-2.5 (3) MG/3ML IN SOLN
3.0000 mL | Freq: Once | RESPIRATORY_TRACT | Status: AC
  Administered 2023-10-24: 3 mL via RESPIRATORY_TRACT

## 2023-10-24 MED ORDER — ALBUTEROL SULFATE (2.5 MG/3ML) 0.083% IN NEBU: 2.5000 mg | INHALATION_SOLUTION | RESPIRATORY_TRACT | 2 refills | Status: DC | PRN

## 2023-10-24 MED ORDER — METHYLPREDNISOLONE 4 MG PO TBPK
ORAL_TABLET | ORAL | 0 refills | Status: DC
Start: 2023-10-24 — End: 2023-11-24

## 2023-10-24 MED ORDER — METHYLPREDNISOLONE SODIUM SUCC 125 MG IJ SOLR
INTRAMUSCULAR | Status: AC
Start: 2023-10-24 — End: 2023-10-24
  Filled 2023-10-24: qty 2

## 2023-10-24 MED ORDER — IPRATROPIUM-ALBUTEROL 0.5-2.5 (3) MG/3ML IN SOLN: 3.0000 mL | Freq: Three times a day (TID) | RESPIRATORY_TRACT | 0 refills | Status: DC | PRN

## 2023-10-24 MED ORDER — IPRATROPIUM-ALBUTEROL 0.5-2.5 (3) MG/3ML IN SOLN
RESPIRATORY_TRACT | Status: AC
  Filled 2023-10-24: qty 3

## 2023-10-24 MED ORDER — BREZTRI AEROSPHERE 160-9-4.8 MCG/ACT IN AERO: 2.0000 | INHALATION_SPRAY | Freq: Two times a day (BID) | RESPIRATORY_TRACT | 3 refills | Status: DC

## 2023-10-24 NOTE — ED Provider Notes (Signed)
 [facility]    CSN: [id number] Arrival date & time: 10/24/23  1709      History   Chief Complaint No chief complaint on file.   HPI Janice Blake is a 53 y.o. female.   53 year old female who presents urgent care with respiratory distress.  Patient reports that for 3 days she has had difficulty breathing.  She has known asthma and has been trying to treat this at home.  She has gone so far as to take her nebulizer to work.  She is having to use albuterol  nebs every 2 hours at times.  She reports she cannot sleep at night as laying down makes it much worse.  She works at a call center and can barely finish sentences without having to take deep breaths to catch her breath.  She denies any recent illnesses.  She has lost her recent asthma physician.  She is looking for a new one now.  She is out of her bretzi inhaler and is running low on her albuterol  nebs.  She denies fevers, cough, congestion, nausea, vomiting.  She does relate that she gets severe changes like this when the weather becomes hotter.     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   Colon cancer screening declined 04/08/2020   COVID-19 vaccination declined 04/08/2020   Influenza vaccination declined 04/08/2020   Overweight 10/15/2017   Back muscle spasm 01/22/2014   Menorrhagia with regular cycle 11/30/2013   Anemia, iron deficiency 11/30/2013   History of nasal polyp 11/30/2013    Allergic rhinitis 05/05/2013   Aspirin-exacerbated respiratory disease (AERD)     Past Surgical History:  Procedure Laterality Date   FRACTURE SURGERY     NASAL POLYP SURGERY Bilateral 2011   NASAL SINUS SURGERY Bilateral 11/03/2014   Procedure: BILATERAL ENDOSCOPIC POLYPECTOMY ETHMOIDECTOMY MAXILLARY ANTROSTOMY ;  Surgeon: Ida Loader, MD;  Location: [facility];  Service: ENT;  Laterality: Bilateral;   PATELLA FRACTURE SURGERY Right 1996   crushed; rod w/12 screws on one side, 6 screws on the other   TUBAL LIGATION  1994    OB History   No obstetric history on file.      Home Medications    Prior to Admission medications   Medication Sig Start Date End Date Taking? Authorizing Provider  budesonide -glycopyrrolate -formoterol  (BREZTRI  AEROSPHERE) 160-9-4.8 MCG/ACT AERO inhaler Inhale 2 puffs into the lungs in the morning and at bedtime. 10/24/23  Yes Davyon Fisch A, PA-C  ipratropium-albuterol  (DUONEB) 0.5-2.5 (3) MG/3ML SOLN Take 3 mLs by nebulization every 8 (eight) hours as needed (Severe shortness of breath or wheezing). 10/24/23 12/03/23 Yes Lajuanda Penick A, PA-C  methylPREDNISolone  (MEDROL  DOSEPAK) 4 MG TBPK tablet Follow package insert 10/24/23  Yes Ouita Nish A, PA-C  albuterol  (PROVENTIL ) (2.5 MG/3ML) 0.083% nebulizer solution Take 3 mLs (2.5 mg total) by nebulization every 4 (four) hours as needed for  wheezing or shortness of breath. 10/24/23   Krishika Bugge A, PA-C  albuterol  (VENTOLIN  HFA) 108 (90 Base) MCG/ACT inhaler Inhale 2 puffs into the lungs every 4 (four) hours as needed for wheezing or shortness of breath. 08/06/23   Tawkaliyar, Roya, DO  azelastine  (ASTELIN ) 0.1 % nasal spray Place 1 spray into both nostrils every morning. Use in each nostril as directed 01/13/23   Cherlyn Labella, MD  fluticasone  (FLONASE ) 50 MCG/ACT nasal spray Place 1 spray into both nostrils at bedtime. 01/12/23   Cherlyn Labella, MD  hydrOXYzine  (ATARAX ) 10 MG tablet TAKE 1  TABLET BY MOUTH THREE TIMES DAILY AS NEEDED FOR ITCHING 07/22/23   Alexander-Savino, [place], MD  mometasone -formoterol  (DULERA ) 200-5 MCG/ACT AERO Inhale 2 puffs into the lungs 2 (two) times daily. 08/06/23   Tawkaliyar, Roya, DO  montelukast  (SINGULAIR ) 10 MG tablet Take 1 tablet (10 mg total) by mouth at bedtime. 08/06/23   Tawkaliyar, Roya, DO  predniSONE  (DELTASONE ) 10 MG tablet Days 1-4 take 4 tablets (40 mg) daily  Days 5-8 take 3 tablets (30 mg) daily, Days 9-11 take 2 tablets (20 mg) daily, Days 12-14 take 1 tablet (10 mg) daily. 09/22/23   Lavell Bari LABOR, FNP  promethazine -dextromethorphan (PROMETHAZINE -DM) 6.25-15 MG/5ML syrup Take 5 mLs by mouth 4 (four) times daily as needed for cough. Patient not taking: Reported on 10/24/2023 09/22/23   Lavell Bari A, FNP  senna-docusate (SENOKOT-S) 8.6-50 MG tablet Take 2 tablets by mouth 2 (two) times daily. 01/12/23   Akula, Vijaya, MD  Tiotropium Bromide Monohydrate  (SPIRIVA  RESPIMAT) 2.5 MCG/ACT AERS Inhale 2 puffs into the lungs daily. 08/06/23 09/05/23  Heddy Barren, DO    Family History Family History  Problem Relation Age of Onset   Allergic rhinitis Mother    Asthma Father    Hypertension Father    Diabetes Maternal Grandmother    Hypertension Maternal Grandfather    Cancer Paternal Grandmother    Cancer Paternal Grandfather        lung cancer    Asthma Son     Social History Social History   Tobacco Use   Smoking status: Never   Smokeless tobacco: Never  Vaping Use   Vaping status: Never Used  Substance Use Topics   Alcohol use: Not Currently    Comment: occ.   Drug use: No     Allergies   Ketorolac , Amoxil [amoxicillin], and Levaquin [levofloxacin]   Review of Systems Review of Systems  Constitutional:  Negative for chills and fever.  HENT:  Negative for ear pain and sore throat.   Eyes:  Negative for pain and visual disturbance.  Respiratory:  Positive for shortness of breath and wheezing. Negative for cough.    Cardiovascular:  Negative for chest pain and palpitations.  Gastrointestinal:  Negative for abdominal pain and vomiting.  Genitourinary:  Negative for dysuria and hematuria.  Musculoskeletal:  Negative for arthralgias and back pain.  Skin:  Negative for color change and rash.  Neurological:  Negative for seizures and syncope.  All other systems reviewed and are negative.    Physical Exam Triage Vital Signs ED Triage Vitals  Encounter Vitals Group     BP 10/24/23 1829 114/77     Girls Systolic BP Percentile --      Girls Diastolic BP Percentile --      Boys Systolic BP Percentile --      Boys Diastolic BP Percentile --      Pulse Rate 10/24/23 1829 92  Resp 10/24/23 1808 (!) 24     Temp --      Temp src --      SpO2 10/24/23 1829 99 %     Weight --      Height --      Head Circumference --      Peak Flow --      Pain Score 10/24/23 1825 0     Pain Loc --      Pain Education --      Exclude from Growth Chart --    No data found.  Updated Vital Signs BP 114/77 (BP Location: Right Arm) Comment (BP Location): large cuff  Pulse 92   Resp (!) 24   SpO2 99% Comment: post breathing treatment  Visual Acuity Right Eye Distance:   Left Eye Distance:   Bilateral Distance:    Right Eye Near:   Left Eye Near:    Bilateral Near:     Physical Exam Vitals and nursing note reviewed.  Constitutional:      General: She is not in acute distress.    Appearance: She is well-developed.     Comments: On admission patient was pursed lip breathing and visually having respiratory difficulty, wheezing was audible  HENT:     Head: Normocephalic and atraumatic.     Nose: Nose normal.     Mouth/Throat:     Mouth: Mucous membranes are moist.  Eyes:     Conjunctiva/sclera: Conjunctivae normal.  Cardiovascular:     Rate and Rhythm: Normal rate and regular rhythm.     Heart sounds: No murmur heard. Pulmonary:     Effort: Tachypnea, accessory muscle usage and respiratory distress  present.     Breath sounds: Examination of the right-upper field reveals wheezing. Examination of the left-upper field reveals wheezing. Examination of the right-middle field reveals wheezing. Examination of the left-middle field reveals wheezing. Examination of the right-lower field reveals wheezing and rhonchi. Examination of the left-lower field reveals wheezing and rhonchi. Wheezing and rhonchi present.     Comments: After DuoNeb treatment patient's breathing was not labored but there was still diffuse wheezing and possibly some rhonchi as well Abdominal:     Palpations: Abdomen is soft.     Tenderness: There is no abdominal tenderness.  Musculoskeletal:        General: No swelling.     Cervical back: Neck supple.  Skin:    General: Skin is warm and dry.     Capillary Refill: Capillary refill takes less than 2 seconds.  Neurological:     Mental Status: She is alert.  Psychiatric:        Mood and Affect: Mood normal.      UC Treatments / Results  Labs (all labs ordered are listed, but only abnormal results are displayed) Labs Reviewed - No data to display  EKG   Radiology No results found.  Procedures Procedures (including critical care time)  Medications Ordered in UC Medications  ipratropium-albuterol  (DUONEB) 0.5-2.5 (3) MG/3ML nebulizer solution 3 mL (3 mLs Nebulization Given 10/24/23 1816)  methylPREDNISolone  sodium succinate (SOLU-MEDROL ) 125 mg/2 mL injection 125 mg (125 mg Intramuscular Given 10/24/23 1817)    Initial Impression / Assessment and Plan / UC Course  I have reviewed the triage vital signs and the nursing notes.  Pertinent labs & imaging results that were available during my care of the patient were reviewed by me and considered in my medical decision making (see chart for details).  Severe persistent asthma with exacerbation - Plan: DG Chest 2 View, DG Chest 2 View  Shortness of breath - Plan: DG Chest 2 View, DG Chest 2 View  Mild  intermittent asthma with acute exacerbation   Severe asthma exacerbation.  Chest x-ray done today.  Final evaluation by the radiologist is still pending but on brief evaluation this appears to be stable from previous x-rays.  Once the x-ray has been read if there are any changes to your treatment we will contact you.  We have treated with the following DuoNeb breathing treatment given in clinic with improvement in symptoms and lung sounds Medrol  injection given today.  This is a steroid to help with airway inflammation Medrol  Dosepak.  Start this tomorrow.  Follow package inserts. Refill sent in for albuterol  nebulizing treatments every 4 hours as needed for shortness of breath Refill sent in for Breztri  inhaler 2 puffs twice daily DuoNeb breathing treatments sent in 3 mL by nebulizer every 8 hours as needed for severe shortness of breath or wheezing Make an appointment as soon as possible to follow-up with internal medicine or asthma specialist Return to urgent care or PCP if symptoms worsen or fail to resolve.    Final Clinical Impressions(s) / UC Diagnoses   Final diagnoses:  Severe persistent asthma with exacerbation  Shortness of breath  Mild intermittent asthma with acute exacerbation     Discharge Instructions      Severe asthma exacerbation.  Chest x-ray done today.  Final evaluation by the radiologist is still pending but on brief evaluation this appears to be stable from previous x-rays.  Once the x-ray has been read if there are any changes to your treatment we will contact you.  We have treated with the following DuoNeb breathing treatment given in clinic with improvement in symptoms and lung sounds Medrol  injection given today.  This is a steroid to help with airway inflammation Medrol  Dosepak.  Start this tomorrow.  Follow package inserts. Refill sent in for albuterol  nebulizing treatments every 4 hours as needed for shortness of breath Refill sent in for Breztri  inhaler 2  puffs twice daily DuoNeb breathing treatments sent in 3 mL by nebulizer every 8 hours as needed for severe shortness of breath or wheezing Make an appointment as soon as possible to follow-up with internal medicine or asthma specialist Return to urgent care or PCP if symptoms worsen or fail to resolve.       ED Prescriptions     Medication Sig Dispense Auth. Provider   albuterol  (PROVENTIL ) (2.5 MG/3ML) 0.083% nebulizer solution Take 3 mLs (2.5 mg total) by nebulization every 4 (four) hours as needed for wheezing or shortness of breath. 75 mL Deena Shaub A, PA-C   ipratropium-albuterol  (DUONEB) 0.5-2.5 (3) MG/3ML SOLN Take 3 mLs by nebulization every 8 (eight) hours as needed (Severe shortness of breath or wheezing). 360 mL Epifanio Labrador A, PA-C   budesonide -glycopyrrolate -formoterol  (BREZTRI  AEROSPHERE) 160-9-4.8 MCG/ACT AERO inhaler Inhale 2 puffs into the lungs in the morning and at bedtime. 10.7 g Keldric Poyer A, PA-C   methylPREDNISolone  (MEDROL  DOSEPAK) 4 MG TBPK tablet Follow package insert 1 each Teresa Almarie LABOR, PA-C      PDMP not reviewed this encounter.   Teresa Almarie LABOR, [place] 10/24/23 1914

## 2023-10-24 NOTE — Discharge Instructions (Addendum)
 Severe asthma exacerbation.  Chest x-ray done today.  Final evaluation by the radiologist is still pending but on brief evaluation this appears to be stable from previous x-rays.  Once the x-ray has been read if there are any changes to your treatment we will contact you.  We have treated with the following DuoNeb breathing treatment given in clinic with improvement in symptoms and lung sounds Medrol  injection given today.  This is a steroid to help with airway inflammation Medrol  Dosepak.  Start this tomorrow.  Follow package inserts. Refill sent in for albuterol  nebulizing treatments every 4 hours as needed for shortness of breath Refill sent in for Breztri  inhaler 2 puffs twice daily DuoNeb breathing treatments sent in 3 mL by nebulizer every 8 hours as needed for severe shortness of breath or wheezing Make an appointment as soon as possible to follow-up with internal medicine or asthma specialist Return to urgent care or PCP if symptoms worsen or fail to resolve.

## 2023-10-24 NOTE — ED Triage Notes (Signed)
 Requested a provider on arrival to treatment room.  Initially P.O. 86% after walking from lobby.  Respiratory rate 24. Speaking in phrases, pursed lip breathing at times.  Vertell Pizza, PA joined patient in treatment roommedication orders placed

## 2023-11-24 ENCOUNTER — Ambulatory Visit (INDEPENDENT_AMBULATORY_CARE_PROVIDER_SITE_OTHER)

## 2023-11-24 ENCOUNTER — Ambulatory Visit (HOSPITAL_COMMUNITY)
Admission: EM | Admit: 2023-11-24 | Discharge: 2023-11-24 | Disposition: A | Discharge status: Home / Self Care | Attending: Nurse Practitioner | Admitting: Nurse Practitioner

## 2023-11-24 ENCOUNTER — Other Ambulatory Visit: Payer: Self-pay

## 2023-11-24 ENCOUNTER — Encounter (HOSPITAL_COMMUNITY): Payer: Self-pay | Admitting: *Deleted

## 2023-11-24 DIAGNOSIS — R051 Acute cough: Secondary | ICD-10-CM | POA: Diagnosis not present

## 2023-11-24 DIAGNOSIS — J4551 Severe persistent asthma with (acute) exacerbation: Secondary | ICD-10-CM | POA: Diagnosis not present

## 2023-11-24 DIAGNOSIS — J45901 Unspecified asthma with (acute) exacerbation: Secondary | ICD-10-CM

## 2023-11-24 MED ORDER — METHYLPREDNISOLONE SODIUM SUCC 125 MG IJ SOLR
80.0000 mg | Freq: Once | INTRAMUSCULAR | Status: AC
  Administered 2023-11-24: 80 mg via INTRAMUSCULAR

## 2023-11-24 MED ORDER — IPRATROPIUM-ALBUTEROL 0.5-2.5 (3) MG/3ML IN SOLN
RESPIRATORY_TRACT | Status: AC
Start: 2023-11-24 — End: 2023-11-24
  Filled 2023-11-24: qty 3

## 2023-11-24 MED ORDER — METHYLPREDNISOLONE SODIUM SUCC 125 MG IJ SOLR
INTRAMUSCULAR | Status: AC
  Filled 2023-11-24: qty 2

## 2023-11-24 MED ORDER — IPRATROPIUM-ALBUTEROL 0.5-2.5 (3) MG/3ML IN SOLN
3.0000 mL | Freq: Once | RESPIRATORY_TRACT | Status: AC
  Administered 2023-11-24: 3 mL via RESPIRATORY_TRACT

## 2023-11-24 MED ORDER — AIRSUPRA 90-80 MCG/ACT IN AERO: 2.0000 | INHALATION_SPRAY | RESPIRATORY_TRACT | 0 refills | Status: DC | PRN

## 2023-11-24 MED ORDER — AZITHROMYCIN 500 MG PO TABS: 500.0000 mg | ORAL_TABLET | Freq: Every day | ORAL | 0 refills | Status: AC

## 2023-11-24 MED ORDER — METHYLPREDNISOLONE ACETATE 80 MG/ML IJ SUSP: 80.0000 mg | Freq: Once | INTRAMUSCULAR | Status: DC

## 2023-11-24 MED ORDER — HYDROCOD POLI-CHLORPHE POLI ER 10-8 MG/5ML PO SUER: 5.0000 mL | Freq: Every day | ORAL | 0 refills | Status: AC

## 2023-11-24 MED ORDER — PREDNISONE 10 MG PO TABS: ORAL_TABLET | ORAL | 0 refills | Status: DC

## 2023-11-24 MED ORDER — MUCINEX DM MAXIMUM STRENGTH 60-1200 MG PO TB12: 1.0000 | ORAL_TABLET | Freq: Two times a day (BID) | ORAL | 0 refills | Status: AC

## 2023-11-24 MED ORDER — PROMETHAZINE-DM 6.25-15 MG/5ML PO SYRP: 10.0000 mL | ORAL_SOLUTION | Freq: Four times a day (QID) | ORAL | 0 refills | Status: DC | PRN

## 2023-11-24 NOTE — Discharge Instructions (Addendum)
 You are being discharged after treatment for an acute asthma exacerbation. You received medications in the clinic that helped improve your breathing. You are stable at this time, but your symptoms were serious and require close attention at home.  Start your prescribed medications as directed. Take azithromycin  once daily for 5 days and begin the prednisone  taper as prescribed. Use your new rescue inhaler, Airsupra , with 2 puffs as needed for shortness of breath, up to 12 inhalations in 24 hours. You may also use your DuoNeb treatment every 20 minutes for up to 3 doses if you experience severe symptoms. For ongoing cough and chest congestion, take Mucinex  DM twice a day for 10 days, Promethazine  DM every 6 hours as needed, and Tussionex 5 mL nightly. Continue using your daily Flonase , Singulair , and Breztri  as previously prescribed. Do not use Dulera  or Spiriva , as they have been discontinued to avoid unnecessary side effects.  At home, avoid known asthma triggers such as dust, smoke, strong odors, and extreme temperatures. Continue using your humidifier and rest as needed. Stay well-hydrated and monitor your breathing closely.  Seek emergency care immediately if you develop severe shortness of breath, chest tightness that does not improve with rescue medications, difficulty speaking, wheezing that worsens, or if your lips or fingernails turn blue.   Follow up with your primary care provider and arrange a pulmonology visit as soon as possible for ongoing asthma management.

## 2023-11-24 NOTE — ED Provider Notes (Signed)
 [facility]    CSN: [id number] Arrival date & time: 11/24/23  1044      History   Chief Complaint Chief Complaint  Patient presents with   Shortness of Breath    HPI Janice Blake is a 53 y.o. female.   Patient with a history of asthma presents with worsening symptoms over the past week, particularly exacerbating since Friday. The patient reports experiencing shortness of breath with minimal exertion, such as short walks, which leads to exhaustion and chest tightness. Lying flat has become difficult, necessitating sleeping in a sitting position. Even routine activities like cooking and washing dishes, which require leaning over, have become challenging. 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   Current medications include albuterol  nebulizer, Flonase , Breztri , Zyrtec , and Singulair . The patient reports that she also has Spiriva  and Dulera . Despite these medications, the patient's symptoms have continued to worsen, significantly impacting daily activities and sleep quality. No fevers, nasal congestion, runny nose, sore throat, nausea, vomiting, diarrhea or headache.   The patient has not been able to see a pulmonologist in [place] due to scheduling issues, indicating a gap in specialized care for their condition.  The following portions of the patient's history were reviewed and updated as appropriate: allergies, current medications, past family history, past medical history, past social history, past surgical history, and problem list.      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   Colon cancer screening declined 04/08/2020   COVID-19 vaccination declined 04/08/2020   Influenza vaccination declined 04/08/2020   Overweight 10/15/2017   Back muscle spasm 01/22/2014   Menorrhagia with regular cycle 11/30/2013   Anemia, iron deficiency 11/30/2013   History of nasal polyp 11/30/2013   Allergic rhinitis 05/05/2013   Aspirin-exacerbated respiratory disease (AERD)     Past Surgical History:  Procedure Laterality Date   FRACTURE SURGERY     NASAL POLYP SURGERY Bilateral 2011   NASAL SINUS SURGERY Bilateral 11/03/2014   Procedure: BILATERAL ENDOSCOPIC POLYPECTOMY ETHMOIDECTOMY MAXILLARY ANTROSTOMY ;  Surgeon: Ida Loader, MD;  Location: [facility];  Service: ENT;  Laterality: Bilateral;   PATELLA FRACTURE SURGERY Right 1996   crushed; rod w/12 screws on one side, 6 screws on the other   TUBAL LIGATION  1994    OB History   No obstetric history on file.      Home Medications    Prior to Admission medications   Medication Sig Start Date End Date Taking? Authorizing Provider  Albuterol -Budesonide  (AIRSUPRA ) 90-80 MCG/ACT AERO Inhale 2 Inhalations into the lungs every 2 (two) hours as needed (shortness of breath, wheezing and/or bronchospasms). Do not exceed more than 12 inhalations in a 24 hour period 11/24/23  Yes Soma Lizak, FNP  azithromycin  (ZITHROMAX ) 500 MG tablet Take 1 tablet (500 mg total) by mouth daily for 5  days. 11/24/23 11/29/23 Yes Iola Lukes, FNP  budesonide -glycopyrrolate -formoterol  (BREZTRI  AEROSPHERE) 160-9-4.8 MCG/ACT AERO inhaler Inhale 2 puffs into the lungs in the morning and at bedtime. 10/24/23  Yes White, Elizabeth A, PA-C  chlorpheniramine-HYDROcodone  (TUSSIONEX) 10-8 MG/5ML Take 5 mLs by mouth at bedtime.  11/24/23  Yes Iola Lukes, FNP  Dextromethorphan-guaiFENesin  (MUCINEX  DM MAXIMUM STRENGTH) 60-1200 MG TB12 Take 1 tablet by mouth 2 (two) times daily. 11/24/23  Yes Iola Lukes, FNP  fluticasone  (FLONASE ) 50 MCG/ACT nasal spray Place 1 spray into both nostrils at bedtime. 01/12/23  Yes Cherlyn Labella, MD  ipratropium-albuterol  (DUONEB) 0.5-2.5 (3) MG/3ML SOLN Take 3 mLs by nebulization every 8 (eight) hours as needed (Severe shortness of breath or wheezing). 10/24/23 12/03/23 Yes White, Elizabeth A, PA-C  montelukast  (SINGULAIR ) 10 MG tablet Take 1 tablet (10 mg total) by mouth at bedtime. 08/06/23  Yes Tawkaliyar, Roya, DO  predniSONE  (DELTASONE ) 10 MG tablet Take 8 tablets (80 mg total) by mouth on day 1 and 2 with breakfast, then take 7 tablets (70 mg total) by mouth on days 3 and 4 with breakfast then take 6 tablets (60 mg total) by mouth on days 5 and 6 with breakfast then take 5 tablets (50 mg total) by mouth on days 7 and 8 with breakfast then take 4 tablets (40 mg total) by mouth on days 9 and 10 with breakfast then take 3 tablets (30 mg total) by mouth on days 11 and 12 with breakfast then take 2 tablets (20 mg total) by mouth on days 13 and 14 with breakfast then take 1 tablet (10 mg total) by mouth on days 15, 16, 17, 18, 19 and 20 with breakfast then stop. 11/24/23  Yes Nadira Single, FNP  promethazine -dextromethorphan (PROMETHAZINE -DM) 6.25-15 MG/5ML syrup Take 10 mLs by mouth every 6 (six) hours as needed for cough. 11/24/23  Yes Iola Lukes, FNP    Family History Family History  Problem Relation Age of Onset   Allergic rhinitis Mother    Asthma Father    Hypertension Father    Diabetes Maternal Grandmother    Hypertension Maternal Grandfather    Cancer Paternal Grandmother    Cancer Paternal Grandfather        lung cancer    Asthma Son     Social History Social History   Tobacco Use   Smoking status: Never   Smokeless tobacco: Never  Vaping Use   Vaping status:  Never Used  Substance Use Topics   Alcohol use: Not Currently    Comment: occ.   Drug use: No     Allergies   Ketorolac , Amoxil [amoxicillin], and Levaquin [levofloxacin]   Review of Systems Review of Systems  Constitutional:  Positive for activity change, diaphoresis and fatigue. Negative for fever.  HENT: Negative.    Respiratory:  Positive for cough, chest tightness, shortness of breath and wheezing.   Cardiovascular:  Negative for chest pain, palpitations and leg swelling.  Gastrointestinal:  Negative for diarrhea, nausea and vomiting.  Musculoskeletal:  Negative for myalgias.  Neurological:  Negative for dizziness.  All other systems reviewed and are negative.    Physical Exam Triage Vital Signs ED Triage Vitals  Encounter Vitals Group     BP 11/24/23 1054 128/78     Girls Systolic BP Percentile --      Girls Diastolic BP Percentile --      Boys Systolic BP Percentile --      Boys Diastolic BP Percentile --      Pulse  Rate 11/24/23 1054 (!) 10     Resp 11/24/23 1054 (!) 24     Temp --      Temp src --      SpO2 11/24/23 1054 99 %     Weight --      Height --      Head Circumference --      Peak Flow --      Pain Score 11/24/23 1057 0     Pain Loc --      Pain Education --      Exclude from Growth Chart --    No data found.  Updated Vital Signs BP 128/78   Pulse 85   Resp 18   SpO2 100%   Visual Acuity Right Eye Distance:   Left Eye Distance:   Bilateral Distance:    Right Eye Near:   Left Eye Near:    Bilateral Near:     Physical Exam Vitals reviewed.  Constitutional:      General: She is awake. She is in acute distress.     Appearance: Normal appearance. She is well-developed and well-groomed. She is not ill-appearing, toxic-appearing or diaphoretic.  HENT:     Head: Normocephalic.     Right Ear: Tympanic membrane, ear canal and external ear normal. No drainage, swelling or tenderness. No middle ear effusion. Tympanic membrane is not  erythematous.     Left Ear: Tympanic membrane, ear canal and external ear normal. No drainage, swelling or tenderness.  No middle ear effusion. Tympanic membrane is not erythematous.     Nose: No congestion or rhinorrhea.     Mouth/Throat:     Lips: Pink.     Mouth: Mucous membranes are moist.     Pharynx: No pharyngeal swelling, oropharyngeal exudate, posterior oropharyngeal erythema or uvula swelling.     Tonsils: No tonsillar exudate or tonsillar abscesses.  Eyes:     General: Vision grossly intact.     Conjunctiva/sclera: Conjunctivae normal.  Cardiovascular:     Rate and Rhythm: Normal rate.     Heart sounds: Normal heart sounds.  Pulmonary:     Effort: Tachypnea, accessory muscle usage and respiratory distress present.     Breath sounds: Wheezing (throughout all lung fields) present.  Musculoskeletal:        General: Normal range of motion.     Cervical back: Normal range of motion and neck supple.  Lymphadenopathy:     Cervical: No cervical adenopathy.  Skin:    General: Skin is warm and dry.  Neurological:     General: No focal deficit present.     Mental Status: She is alert and oriented to person, place, and time.  Psychiatric:        Behavior: Behavior is cooperative.      UC Treatments / Results  Labs (all labs ordered are listed, but only abnormal results are displayed) Labs Reviewed - No data to display  EKG   Radiology DG Chest 2 View Result Date: 11/24/2023 CLINICAL DATA:  cough, wheeze and shortness of breath, getting worse over the past couple of days; hx asthma EXAM: DG CHEST 2V COMPARISON:  Chest x-ray 10/24/2023 FINDINGS: The heart and mediastinal contours are within normal limits. No focal consolidation. Chronic coarsened interstitial markings no overt pulmonary edema. No pleural effusion. No pneumothorax. No acute osseous abnormality. IMPRESSION: 1. No active cardiopulmonary disease. 2. Chronic coarsened interstitial markings which is likely related to  history of asthma. Electronically Signed   By: Morgane  Margarite M.D.   On: 11/24/2023 12:07    Procedures Procedures (including critical care time)  Medications Ordered in UC Medications  ipratropium-albuterol  (DUONEB) 0.5-2.5 (3) MG/3ML nebulizer solution 3 mL (3 mLs Nebulization Given 11/24/23 1116)  methylPREDNISolone  sodium succinate (SOLU-MEDROL ) 125 mg/2 mL injection 80 mg (80 mg Intramuscular Given 11/24/23 1143)    Initial Impression / Assessment and Plan / UC Course  I have reviewed the triage vital signs and the nursing notes.  Pertinent labs & imaging results that were available during my care of the patient were reviewed by me and considered in my medical decision making (see chart for details).     Patient presents with worsening asthma symptoms over the past week, significantly exacerbated since Friday, with reports of dyspnea on exertion, chest tightness, and orthopnea. Symptoms have interfered with daily functioning. Patient has a longstanding history of asthma, initially triggered by dust particles at a yarn mill.   Upon arrival to urgent care, she was in acute respiratory distress with tachypnea, audible wheezing, and limited ability to speak. Exam revealed diffuse wheezing throughout all lung fields; however, she was afebrile with stable blood pressure and warm, dry skin. Immediate treatment with DuoNeb and Solu-Medrol  was administered. Chest x-ray showed no evidence of acute cardiopulmonary disease, but chronic interstitial changes likely due to asthma were noted.   On reassessment, the patient was no longer in distress, able to speak in full sentences, and oxygen saturation was 100%. Wheezing persisted but was significantly improved. She is stable for discharge with strict ED precautions.   Treatment includes azithromycin  for possible infectious trigger, a prednisone  taper, and a change in rescue inhaler from albuterol  to Airsupra  with dosing instructions. Supportive  medications include Mucinex  DM, Promethazine  DM, and Tussionex for cough management. Patient will continue Flonase , Singulair , and Breztri ; Dulera  and Spiriva  have been discontinued to reduce medication redundancy and side effects. DuoNeb may be used every 20 minutes for up to three doses during severe episodes. Patient was advised to go to the emergency department immediately if symptoms worsen or fail to improve. Pulmonology follow-up is strongly recommended.  Today's evaluation has revealed no signs of a dangerous process. Discussed diagnosis with patient and/or guardian. Patient and/or guardian aware of their diagnosis, possible red flag symptoms to watch out for and need for close follow up. Patient and/or guardian understands verbal and written discharge instructions. Patient and/or guardian comfortable with plan and disposition.  Patient and/or guardian has a clear mental status at this time, good insight into illness (after discussion and teaching) and has clear judgment to make decisions regarding their care  Documentation was completed with the aid of voice recognition software. Transcription may contain typographical errors.  Final Clinical Impressions(s) / UC Diagnoses   Final diagnoses:  Acute cough  Severe asthma with exacerbation, unspecified whether persistent     Discharge Instructions      You are being discharged after treatment for an acute asthma exacerbation. You received medications in the clinic that helped improve your breathing. You are stable at this time, but your symptoms were serious and require close attention at home.  Start your prescribed medications as directed. Take azithromycin  once daily for 5 days and begin the prednisone  taper as prescribed. Use your new rescue inhaler, Airsupra , with 2 puffs as needed for shortness of breath, up to 12 inhalations in 24 hours. You may also use your DuoNeb treatment every 20 minutes for up to 3 doses if you experience severe  symptoms. For ongoing cough  and chest congestion, take Mucinex  DM twice a day for 10 days, Promethazine  DM every 6 hours as needed, and Tussionex 5 mL nightly. Continue using your daily Flonase , Singulair , and Breztri  as previously prescribed. Do not use Dulera  or Spiriva , as they have been discontinued to avoid unnecessary side effects.  At home, avoid known asthma triggers such as dust, smoke, strong odors, and extreme temperatures. Continue using your humidifier and rest as needed. Stay well-hydrated and monitor your breathing closely.  Seek emergency care immediately if you develop severe shortness of breath, chest tightness that does not improve with rescue medications, difficulty speaking, wheezing that worsens, or if your lips or fingernails turn blue.   Follow up with your primary care provider and arrange a pulmonology visit as soon as possible for ongoing asthma management.      ED Prescriptions     Medication Sig Dispense Auth. Provider   Albuterol -Budesonide  (AIRSUPRA ) 90-80 MCG/ACT AERO Inhale 2 Inhalations into the lungs every 2 (two) hours as needed (shortness of breath, wheezing and/or bronchospasms). Do not exceed more than 12 inhalations in a 24 hour period 5.9 g Iola Lukes, FNP   azithromycin  (ZITHROMAX ) 500 MG tablet Take 1 tablet (500 mg total) by mouth daily for 5 days. 5 tablet Buck Mcaffee, Muniz, FNP   Dextromethorphan-guaiFENesin  (MUCINEX  DM MAXIMUM STRENGTH) 60-1200 MG TB12 Take 1 tablet by mouth 2 (two) times daily. 20 tablet Iola Lukes, FNP   promethazine -dextromethorphan (PROMETHAZINE -DM) 6.25-15 MG/5ML syrup Take 10 mLs by mouth every 6 (six) hours as needed for cough. 118 mL Iola Lukes, FNP   chlorpheniramine-HYDROcodone  (TUSSIONEX) 10-8 MG/5ML Take 5 mLs by mouth at bedtime. 70 mL Iola Lukes, FNP   predniSONE  (DELTASONE ) 10 MG tablet Take 8 tablets (80 mg total) by mouth on day 1 and 2 with breakfast, then take 7 tablets (70 mg total) by  mouth on days 3 and 4 with breakfast then take 6 tablets (60 mg total) by mouth on days 5 and 6 with breakfast then take 5 tablets (50 mg total) by mouth on days 7 and 8 with breakfast then take 4 tablets (40 mg total) by mouth on days 9 and 10 with breakfast then take 3 tablets (30 mg total) by mouth on days 11 and 12 with breakfast then take 2 tablets (20 mg total) by mouth on days 13 and 14 with breakfast then take 1 tablet (10 mg total) by mouth on days 15, 16, 17, 18, 19 and 20 with breakfast then stop. 76 tablet Iola Lukes, FNP      I have reviewed the PDMP during this encounter.   Iola Lukes, [place] 11/24/23 1306

## 2023-11-24 NOTE — ED Triage Notes (Signed)
 PT reports [facility] for 3 days . SABRA Pt reports SHOB worse today SPO2 93 0n RA. Pt uses Neb at home with relief. Provider at bed side to assess PT.

## 2023-12-03 ENCOUNTER — Ambulatory Visit: Admitting: Nurse Practitioner

## 2024-01-13 ENCOUNTER — Ambulatory Visit: Admitting: Nurse Practitioner

## 2024-01-29 ENCOUNTER — Ambulatory Visit: Admitting: Nurse Practitioner

## 2024-02-09 ENCOUNTER — Telehealth: Payer: Self-pay | Admitting: Family

## 2024-02-09 DIAGNOSIS — J4551 Severe persistent asthma with (acute) exacerbation: Secondary | ICD-10-CM

## 2024-02-09 MED ORDER — ALBUTEROL SULFATE (2.5 MG/3ML) 0.083% IN NEBU: 2.5000 mg | INHALATION_SOLUTION | Freq: Four times a day (QID) | RESPIRATORY_TRACT | 1 refills | Status: DC | PRN

## 2024-02-09 MED ORDER — MOMETASONE FURO-FORMOTEROL FUM 100-5 MCG/ACT IN AERO: 2.0000 | INHALATION_SPRAY | Freq: Two times a day (BID) | RESPIRATORY_TRACT | 2 refills | Status: DC

## 2024-02-09 MED ORDER — PREDNISONE 10 MG (21) PO TBPK: ORAL_TABLET | ORAL | 0 refills | Status: DC

## 2024-02-09 NOTE — Progress Notes (Signed)
 Virtual Visit Consent   Janice Blake, you are scheduled for a virtual visit with a [facility] provider today. Just as with appointments in the office, your consent must be obtained to participate. Your consent will be active for this visit and any virtual visit you may have with one of our providers in the next 365 days. If you have a MyChart account, a copy of this consent can be sent to you electronically.  As this is a virtual visit, video technology does not allow for your provider to perform a traditional examination. This may limit your provider's ability to fully assess your condition. If your provider identifies any concerns that need to be evaluated in person or the need to arrange testing (such as labs, EKG, etc.), we will make arrangements to do so. Although advances in technology are sophisticated, we cannot ensure that it will always work on either your end or our end. If the connection with a video visit is poor, the visit may have to be switched to a telephone visit. With either a video or telephone visit, we are not always able to ensure that we have a secure connection.  By engaging in this virtual visit, you consent to the provision of healthcare and authorize for your insurance to be billed (if applicable) for the services provided during this visit. Depending on your insurance coverage, you may receive a charge related to this service.  I need to obtain your verbal consent now. Are you willing to proceed with your visit today? Janice Blake has provided verbal consent on 02/09/2024 for a virtual visit (video or telephone). Bari Learn, FNP  Date: 02/09/2024 1:21 PM   Virtual Visit via Video Note   I, Bari Learn, connected with  Janice Blake  ([id number], 1970-06-03) on 02/09/24 at  1:15 PM EDT by a video-enabled telemedicine application and verified that I am speaking with the correct person using two identifiers.  Location: Patient: Virtual Visit Location  Patient: Home Provider: Virtual Visit Location Provider: Home Office   I discussed the limitations of evaluation and management by telemedicine and the availability of in person appointments. The patient expressed understanding and agreed to proceed.    History of Present Illness: Janice Blake is a 53 y.o. who identifies as a female who was assigned female at birth, and is being seen today for asthma flare up. She has recently started a new job and lost her medicaid and has not been able to get her medications.   HPI: Asthma She complains of cough, shortness of breath, sputum production and wheezing. This is a recurrent problem. The current episode started 1 to 4 weeks ago. The problem occurs intermittently. The cough is productive of sputum. Her symptoms are alleviated by rest. She reports significant improvement on treatment. Her past medical history is significant for asthma.    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   Colon cancer screening declined 04/08/2020   COVID-19 vaccination declined 04/08/2020   Influenza vaccination declined 04/08/2020   Overweight 10/15/2017   Back muscle spasm 01/22/2014   Menorrhagia with regular cycle 11/30/2013   Anemia, iron deficiency 11/30/2013   History of nasal polyp 11/30/2013   Allergic rhinitis 05/05/2013   Aspirin-exacerbated  respiratory disease (AERD)     Allergies:  Allergies  Allergen Reactions   Ketorolac  Shortness Of Breath    Throat swelling > given Solu Medrol  + Benadryl    Amoxil [Amoxicillin] Hives and Itching   Levaquin [Levofloxacin] Hives and Itching   Medications:  Current Outpatient Medications:    albuterol  (PROVENTIL ) (2.5 MG/3ML) 0.083%  nebulizer solution, Take 3 mLs (2.5 mg total) by nebulization every 6 (six) hours as needed for wheezing or shortness of breath., Disp: 150 mL, Rfl: 1   mometasone -formoterol  (DULERA ) 100-5 MCG/ACT AERO, Inhale 2 puffs into the lungs 2 (two) times daily., Disp: 13 g, Rfl: 2   predniSONE  (STERAPRED UNI-PAK 21 TAB) 10 MG (21) TBPK tablet, Use as directed, Disp: 21 tablet, Rfl: 0   Albuterol -Budesonide  (AIRSUPRA ) 90-80 MCG/ACT AERO, Inhale 2 Inhalations into the lungs every 2 (two) hours as needed (shortness of breath, wheezing and/or bronchospasms). Do not exceed more than 12 inhalations in a 24 hour period, Disp: 5.9 g, Rfl: 0   budesonide -glycopyrrolate -formoterol  (BREZTRI  AEROSPHERE) 160-9-4.8 MCG/ACT AERO inhaler, Inhale 2 puffs into the lungs in the morning and at bedtime., Disp: 10.7 g, Rfl: 3   chlorpheniramine-HYDROcodone  (TUSSIONEX) 10-8 MG/5ML, Take 5 mLs by mouth at bedtime., Disp: 70 mL, Rfl: 0   Dextromethorphan-guaiFENesin  (MUCINEX  DM MAXIMUM STRENGTH) 60-1200 MG TB12, Take 1 tablet by mouth 2 (two) times daily., Disp: 20 tablet, Rfl: 0   fluticasone  (FLONASE ) 50 MCG/ACT nasal spray, Place 1 spray into both nostrils at bedtime., Disp: 16 g, Rfl: 2   ipratropium-albuterol  (DUONEB) 0.5-2.5 (3) MG/3ML SOLN, Take 3 mLs by nebulization every 8 (eight) hours as needed (Severe shortness of breath or wheezing)., Disp: 360 mL, Rfl: 0   montelukast  (SINGULAIR ) 10 MG tablet, Take 1 tablet (10 mg total) by mouth at bedtime., Disp: 90 tablet, Rfl: 2   promethazine -dextromethorphan (PROMETHAZINE -DM) 6.25-15 MG/5ML syrup, Take 10 mLs by mouth every 6 (six) hours as needed for cough., Disp: 118 mL, Rfl: 0  Observations/Objective: Patient is well-developed, well-nourished in no acute distress.  Resting comfortably  at home.  Head is normocephalic, atraumatic.  No labored breathing.  Speech is clear and coherent with logical content.  Patient is alert and oriented at baseline.  Tight nonproductive  cough  Assessment and Plan: 1. Severe persistent asthma with exacerbation (HCC) (Primary) - predniSONE  (STERAPRED UNI-PAK 21 TAB) 10 MG (21) TBPK tablet; Use as directed  Dispense: 21 tablet; Refill: 0 - mometasone -formoterol  (DULERA ) 100-5 MCG/ACT AERO; Inhale 2 puffs into the lungs 2 (two) times daily.  Dispense: 13 g; Refill: 2 - albuterol  (PROVENTIL ) (2.5 MG/3ML) 0.083% nebulizer solution; Take 3 mLs (2.5 mg total) by nebulization every 6 (six) hours as needed for wheezing or shortness of breath.  Dispense: 150 mL; Refill: 1  - Take meds as prescribed Will refill medications, will send to [facility] for self pay.  - Use a cool mist humidifier  -Use saline nose sprays frequently -Force fluids -For any cough or congestion  Use plain Mucinex - regular strength or max strength is fine -For fever or aces or pains- take tylenol  or ibuprofen . -Throat lozenges if help -Follow up if symptoms worsen or do not improve   Follow Up Instructions: I discussed the assessment and treatment plan with the patient. The patient was provided an opportunity to ask questions and all were answered. The patient agreed with the plan and demonstrated an understanding of the instructions.  A copy of instructions were sent to the patient via MyChart unless otherwise noted below.  The patient was advised to call back or seek an in-person evaluation if the symptoms worsen or if the condition fails to improve as anticipated.    Bari Learn, FNP

## 2024-03-01 ENCOUNTER — Encounter (HOSPITAL_COMMUNITY): Payer: Self-pay

## 2024-03-01 ENCOUNTER — Ambulatory Visit (INDEPENDENT_AMBULATORY_CARE_PROVIDER_SITE_OTHER): Payer: Self-pay

## 2024-03-01 ENCOUNTER — Ambulatory Visit (HOSPITAL_COMMUNITY)
Admission: RE | Admit: 2024-03-01 | Discharge: 2024-03-01 | Disposition: A | Discharge status: Home / Self Care | Payer: Self-pay | Source: Ambulatory Visit

## 2024-03-01 ENCOUNTER — Other Ambulatory Visit: Payer: Self-pay

## 2024-03-01 VITALS — BP 114/72 | HR 96 | Temp 97.9°F | Resp 24

## 2024-03-01 DIAGNOSIS — J4541 Moderate persistent asthma with (acute) exacerbation: Secondary | ICD-10-CM

## 2024-03-01 DIAGNOSIS — R062 Wheezing: Secondary | ICD-10-CM

## 2024-03-01 MED ORDER — IPRATROPIUM-ALBUTEROL 0.5-2.5 (3) MG/3ML IN SOLN: 3.0000 mL | Freq: Four times a day (QID) | RESPIRATORY_TRACT | 2 refills | Status: DC | PRN

## 2024-03-01 MED ORDER — IPRATROPIUM-ALBUTEROL 0.5-2.5 (3) MG/3ML IN SOLN
RESPIRATORY_TRACT | Status: AC
  Filled 2024-03-01: qty 3

## 2024-03-01 MED ORDER — PREDNISONE 20 MG PO TABS: 40.0000 mg | ORAL_TABLET | Freq: Every day | ORAL | 3 refills | Status: AC

## 2024-03-01 MED ORDER — PROMETHAZINE-DM 6.25-15 MG/5ML PO SYRP: 10.0000 mL | ORAL_SOLUTION | Freq: Three times a day (TID) | ORAL | 0 refills | Status: AC | PRN

## 2024-03-01 MED ORDER — IPRATROPIUM-ALBUTEROL 0.5-2.5 (3) MG/3ML IN SOLN
3.0000 mL | Freq: Once | RESPIRATORY_TRACT | Status: AC
  Administered 2024-03-01: 3 mL via RESPIRATORY_TRACT

## 2024-03-01 MED ORDER — AZELASTINE HCL 0.1 % NA SOLN: 1.0000 | Freq: Two times a day (BID) | NASAL | 1 refills | Status: AC

## 2024-03-01 NOTE — Discharge Instructions (Addendum)
  1. Moderate persistent asthma with exacerbation (Primary) - ipratropium-albuterol  (DUONEB) 0.5-2.5 (3) MG/3ML nebulizer solution 3 mL treatment given in UC for acute wheezing and asthma exacerbation - DG Chest 2 View x-ray reveals no obvious consolidation or pneumonia however there is bronchial thickening and significant sign of bronchitis versus reactive airway disease which is consistent with symptoms. - predniSONE  (DELTASONE ) 20 MG tablet; Take 2 tablets (40 mg total) by mouth daily for 5 days.  Dispense: 10 tablet; Refill: 3 - ipratropium-albuterol  (DUONEB) 0.5-2.5 (3) MG/3ML SOLN; Take 3 mLs by nebulization every 6 (six) hours as needed.  Dispense: 36 mL; Refill: 2 - azelastine  (ASTELIN ) 0.1 % nasal spray; Place 1 spray into both nostrils 2 (two) times daily. Use in each nostril as directed  Dispense: 30 mL; Refill: 1 - promethazine -dextromethorphan (PROMETHAZINE -DM) 6.25-15 MG/5ML syrup; Take 10 mLs by mouth 3 (three) times daily as needed for cough.  Dispense: 240 mL; Refill: 0 -Continue to monitor symptoms for any change in severity if there is any escalation of current symptoms or development of new symptoms follow-up in ER for further evaluation and management.

## 2024-03-01 NOTE — ED Triage Notes (Signed)
 PT reports SHOB and wheezing for one week . Pt has been using home meds.

## 2024-03-01 NOTE — ED Provider Notes (Signed)
 [facility] [place]  Note:  This document was prepared using Conservation officer, historic buildings and may include unintentional dictation errors.  MRN: [id number] DOB: 04/19/1971  Subjective:   Janice Blake is a 53 y.o. female presenting for persistent shortness of breath and wheezing x 1 week.  Patient reports she has an extensive history of asthma with frequent flareups.  Patient states that she denies any known sick contacts.  Patient reports last asthma exacerbation was 2 to 3 weeks ago.  Patient reports that she has not seen her pulmonologist in a while because she lost her Medicaid.  Patient is about to start a new job and get insurance she will follow-up with pulmonology when she insurance established.  Denies any chest pain, weakness, dizziness, fever.  No current facility-administered medications for this encounter.  Current Outpatient Medications:    albuterol  (PROVENTIL ) (2.5 MG/3ML) 0.083% nebulizer solution, Take 3 mLs (2.5 mg total) by nebulization every 6 (six) hours as needed for wheezing or shortness of breath., Disp: 150 mL, Rfl: 1   azelastine  (ASTELIN ) 0.1 % nasal spray, Place 1 spray into both nostrils 2 (two) times daily. Use in each nostril as directed, Disp: 30 mL, Rfl: 1   budesonide -glycopyrrolate -formoterol  (BREZTRI  AEROSPHERE) 160-9-4.8 MCG/ACT AERO inhaler, Inhale 2 puffs into the lungs in the morning and at bedtime., Disp: 10.7 g, Rfl: 3   Dextromethorphan-guaiFENesin  (MUCINEX  DM MAXIMUM STRENGTH) 60-1200 MG TB12, Take 1 tablet by mouth 2 (two) times daily., Disp: 20 tablet, Rfl: 0   fluticasone  (FLONASE ) 50 MCG/ACT nasal spray, Place 1 spray into both nostrils at bedtime., Disp: 16 g, Rfl: 2   ipratropium-albuterol  (DUONEB) 0.5-2.5 (3) MG/3ML SOLN, Take 3 mLs by nebulization every 6 (six) hours as needed., Disp: 36 mL, Rfl: 2   predniSONE  (DELTASONE ) 20 MG tablet, Take 2 tablets (40 mg total) by mouth daily for 5 days., Disp: 10 tablet, Rfl: 3    promethazine -dextromethorphan (PROMETHAZINE -DM) 6.25-15 MG/5ML syrup, Take 10 mLs by mouth 3 (three) times daily as needed for cough., Disp: 240 mL, Rfl: 0   Albuterol -Budesonide  (AIRSUPRA ) 90-80 MCG/ACT AERO, Inhale 2 Inhalations into the lungs every 2 (two) hours as needed (shortness of breath, wheezing and/or bronchospasms). Do not exceed more than 12 inhalations in a 24 hour period, Disp: 5.9 g, Rfl: 0   chlorpheniramine-HYDROcodone  (TUSSIONEX) 10-8 MG/5ML, Take 5 mLs by mouth at bedtime., Disp: 70 mL, Rfl: 0   mometasone -formoterol  (DULERA ) 100-5 MCG/ACT AERO, Inhale 2 puffs into the lungs 2 (two) times daily., Disp: 13 g, Rfl: 2   montelukast  (SINGULAIR ) 10 MG tablet, Take 1 tablet (10 mg total) by mouth at bedtime., Disp: 90 tablet, Rfl: 2   Allergies  Allergen Reactions   Ketorolac  Shortness Of Breath    Throat swelling > given Solu Medrol  + Benadryl    Amoxil [Amoxicillin] Hives and Itching   Levaquin [Levofloxacin] Hives and Itching    Past Medical History:  Diagnosis Date   Anemia    Asthma    [place] asthma (HCC)    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    Nasal polyps    Shortness of breath    just related to the asthma (05/05/2013)     Past Surgical History:  Procedure Laterality Date   FRACTURE SURGERY     NASAL POLYP SURGERY Bilateral 2011   NASAL SINUS SURGERY Bilateral 11/03/2014   Procedure: BILATERAL ENDOSCOPIC POLYPECTOMY ETHMOIDECTOMY MAXILLARY ANTROSTOMY ;  Surgeon: Ida Loader, MD;  Location: [facility];  Service: ENT;  Laterality: Bilateral;   PATELLA FRACTURE SURGERY Right 1996   crushed; rod w/12 screws on one side, 6 screws on the other   TUBAL LIGATION  1994    Family History  Problem Relation Age of Onset   Allergic rhinitis Mother    Asthma Father    Hypertension Father    Diabetes Maternal Grandmother    Hypertension Maternal Grandfather    Cancer Paternal Grandmother     Cancer Paternal Grandfather        lung cancer    Asthma Son     Social History   Tobacco Use   Smoking status: Never   Smokeless tobacco: Never  Vaping Use   Vaping status: Never Used  Substance Use Topics   Alcohol use: Not Currently    Comment: occ.   Drug use: No    ROS Refer to HPI for ROS details.  Objective:    Vitals: BP 114/72   Pulse 96   Temp 97.9 F (36.6 C)   Resp (!) 24   SpO2 93%   Physical Exam Vitals and nursing note reviewed.  Constitutional:      General: She is not in acute distress.    Appearance: She is well-developed. She is not ill-appearing, toxic-appearing or diaphoretic.  HENT:     Head: Normocephalic and atraumatic.     Nose: Nose normal. No congestion or rhinorrhea.     Mouth/Throat:     Mouth: Mucous membranes are moist.     Pharynx: Oropharynx is clear.  Cardiovascular:     Rate and Rhythm: Normal rate and regular rhythm.     Heart sounds: Normal heart sounds. No murmur heard. Pulmonary:     Effort: Accessory muscle usage, respiratory distress and retractions present.     Breath sounds: Decreased air movement present. No stridor. Examination of the right-lower field reveals wheezing. Examination of the left-lower field reveals wheezing. Wheezing present. No rhonchi or rales.  Chest:     Chest wall: No tenderness.  Skin:    General: Skin is warm and dry.  Neurological:     General: No focal deficit present.     Mental Status: She is alert and oriented to person, place, and time.  Psychiatric:        Mood and Affect: Mood normal.        Behavior: Behavior normal.     Procedures  No results found for this or any previous visit (from the past 24 hours).  Assessment and Plan :     Discharge Instructions       1. Moderate persistent asthma with exacerbation (Primary) - ipratropium-albuterol  (DUONEB) 0.5-2.5 (3) MG/3ML nebulizer solution 3 mL treatment given in UC for acute wheezing and asthma exacerbation - DG Chest  2 View x-ray reveals no obvious consolidation or pneumonia however there is bronchial thickening and significant sign of bronchitis versus reactive airway disease which is consistent with symptoms. - predniSONE  (DELTASONE ) 20 MG tablet; Take 2 tablets (40 mg total) by mouth daily for 5 days.  Dispense: 10 tablet; Refill: 3 - ipratropium-albuterol  (DUONEB) 0.5-2.5 (3) MG/3ML SOLN; Take 3 mLs by nebulization every 6 (six) hours as needed.  Dispense: 36 mL; Refill: 2 - azelastine  (ASTELIN ) 0.1 % nasal spray; Place 1 spray into both nostrils 2 (two) times daily. Use in each nostril as directed  Dispense: 30 mL; Refill: 1 - promethazine -dextromethorphan (PROMETHAZINE -DM) 6.25-15 MG/5ML syrup; Take 10 mLs by mouth 3 (three)  times daily as needed for cough.  Dispense: 240 mL; Refill: 0 -Continue to monitor symptoms for any change in severity if there is any escalation of current symptoms or development of new symptoms follow-up in ER for further evaluation and management.      Analea Muller B Palak Tercero   Geraldin Habermehl, [place] B, [place] 03/01/24 [phone]

## 2024-03-06 ENCOUNTER — Other Ambulatory Visit (HOSPITAL_BASED_OUTPATIENT_CLINIC_OR_DEPARTMENT_OTHER): Payer: Self-pay

## 2024-05-05 ENCOUNTER — Ambulatory Visit: Payer: Self-pay | Admitting: Student

## 2024-05-07 ENCOUNTER — Other Ambulatory Visit: Payer: Self-pay

## 2024-05-07 ENCOUNTER — Encounter: Payer: Self-pay | Admitting: Student

## 2024-05-07 ENCOUNTER — Telehealth: Payer: Self-pay

## 2024-05-07 ENCOUNTER — Ambulatory Visit: Payer: PRIVATE HEALTH INSURANCE | Admitting: Student

## 2024-05-07 VITALS — BP 133/83 | HR 95 | Temp 97.6°F | Ht 69.0 in | Wt 257.4 lb

## 2024-05-07 DIAGNOSIS — J4551 Severe persistent asthma with (acute) exacerbation: Secondary | ICD-10-CM

## 2024-05-07 DIAGNOSIS — J4541 Moderate persistent asthma with (acute) exacerbation: Secondary | ICD-10-CM

## 2024-05-07 DIAGNOSIS — J4489 Other specified chronic obstructive pulmonary disease: Secondary | ICD-10-CM

## 2024-05-07 MED ORDER — MONTELUKAST SODIUM 10 MG PO TABS: 10.0000 mg | ORAL_TABLET | Freq: Every day | ORAL | 2 refills | Status: DC

## 2024-05-07 MED ORDER — PREDNISONE 20 MG PO TABS
ORAL_TABLET | ORAL | 0 refills | Status: AC
  Filled 2024-05-07: qty 18, 15d supply, fill #0

## 2024-05-07 MED ORDER — AIRSUPRA 90-80 MCG/ACT IN AERO: 2.0000 | INHALATION_SPRAY | RESPIRATORY_TRACT | 0 refills | Status: DC | PRN

## 2024-05-07 MED ORDER — ALBUTEROL SULFATE (2.5 MG/3ML) 0.083% IN NEBU: 2.5000 mg | INHALATION_SOLUTION | Freq: Four times a day (QID) | RESPIRATORY_TRACT | 1 refills | Status: DC | PRN

## 2024-05-07 MED ORDER — BREZTRI AEROSPHERE 160-9-4.8 MCG/ACT IN AERO: 2.0000 | INHALATION_SPRAY | Freq: Two times a day (BID) | RESPIRATORY_TRACT | 3 refills | Status: DC

## 2024-05-07 MED ORDER — PREDNISONE 20 MG PO TABS: ORAL_TABLET | ORAL | 0 refills | Status: DC

## 2024-05-07 MED ORDER — IPRATROPIUM-ALBUTEROL 0.5-2.5 (3) MG/3ML IN SOLN
3.0000 mL | Freq: Four times a day (QID) | RESPIRATORY_TRACT | 2 refills | Status: AC | PRN
  Filled 2024-05-07: qty 90, 8d supply, fill #0

## 2024-05-07 MED ORDER — IPRATROPIUM-ALBUTEROL 0.5-2.5 (3) MG/3ML IN SOLN: 3.0000 mL | Freq: Four times a day (QID) | RESPIRATORY_TRACT | 2 refills | Status: DC | PRN

## 2024-05-07 MED ORDER — AIRSUPRA 90-80 MCG/ACT IN AERO
2.0000 | INHALATION_SPRAY | RESPIRATORY_TRACT | 0 refills | Status: AC | PRN
  Filled 2024-05-07: qty 10.7, 17d supply, fill #0

## 2024-05-07 MED ORDER — MONTELUKAST SODIUM 10 MG PO TABS
10.0000 mg | ORAL_TABLET | Freq: Every day | ORAL | 2 refills | Status: AC
  Filled 2024-05-07: qty 90, 90d supply, fill #0

## 2024-05-07 MED ORDER — ALBUTEROL SULFATE (2.5 MG/3ML) 0.083% IN NEBU
2.5000 mg | INHALATION_SOLUTION | Freq: Four times a day (QID) | RESPIRATORY_TRACT | 1 refills | Status: AC | PRN
  Filled 2024-05-07: qty 150, 13d supply, fill #0

## 2024-05-07 MED ORDER — BREZTRI AEROSPHERE 160-9-4.8 MCG/ACT IN AERO
2.0000 | INHALATION_SPRAY | Freq: Two times a day (BID) | RESPIRATORY_TRACT | 3 refills | Status: DC
  Filled 2024-05-07: qty 10.7, 30d supply, fill #0

## 2024-05-07 NOTE — Progress Notes (Deleted)
 COPD : RN Telephone Call   Patient ID: Janice Blake Blush, female    DOB: January 11, 1971  Age: 54 y.o. MRN: [id number]  Ms.Janice Blake is a 54 y.o. who was called about COPD management.   Patient reports that they are taking the following medications for COPD:  yes but can't  afford most of them   Patient reports they DO or DO NOT have a prior pulmonologist who they follow:   Please document smoking status, how many packs? Ready to quit ?  - Smoking Hx: She has never smoked.   mMRC (Modified Medical Research Council) Dyspnea Scale   Ask the following questions and please check the box that describes the most.   Choose ONE>   []  Grade 0: I only get breathless with strenuous exercise   []  Grade 1: I get short of breath when hurry on level ground or walking up a slight hill   []  Grade 2: On level Ground, I walk slower than people of the same age because of breathlessness or have to stop for breath when walking my own pace   []  Grade 3: I Stop for breath after walking about 100 yards or after a few minutes on level ground   []  Grade 4: I am too breathless to leave the house, or I am breathless when dressing    CAT (COPD Assessment Test)   Please ask each of the following questions and select between 0-5 based on what describes the patient most accurately.  On a scale 0 to 5, do you never cough or cough all the time: 4  0 (I have no phlegm in my chest at all) 5 (My chest feels very tight) 1  0 (I am not limited doing any activities at home) 0 (I am confident leaving my home despite my lung condition) 5 (I don't sleep soundly because of my lung condition) On a scale 0 to 5, how would you describe your energy?: 0 (I have lots of energy)  Total: 15

## 2024-05-07 NOTE — Assessment & Plan Note (Signed)
 06/18/2022: FVC 2.12 L ( 61%), FEV1 1.21 L ( 44%), indicates severe airway obstruction. Possible mixed defect. Post bronchodilator response shows FVC 2.25 L ( 65%), FEV1 1.24 L (45%). There is a 2 % change in FEV1.  Appropriate medications: Reports that Breztri , Dulera , Albuterol  and nebulizer is not covered by her insurance  Refill for Breztri , Singulair  and nebulizer sent to [facility] pharmacy  Referral to Dr. Brinda [facility]) for medication assistance is placed   Confirm proper inhaler technique use taught by RN is done  Referral to Pulm is placed and contact is provided  Prednisone  taper therapy is sent to the pharmacy

## 2024-05-07 NOTE — Progress Notes (Signed)
 SATURATION    Patient Saturations on Room Air at Rest = 97%   Patient Saturations on Room Air while Ambulating = 90%

## 2024-05-07 NOTE — Progress Notes (Signed)
" ° °  Patient ID: Janice Blake, female    DOB: 08/09/1970  Age: 54 y.o. MRN: [id number]  Janice Blake is a 54 y.o. presents today for follow up on COPD assessment.   Patient reports that they are taking the following medications for COPD: Breztri  inhaler 2 puffs BID alternating with Dulera  and nebulizer solution    Patient reports they DO or DO NOT have a prior pulmonologist who they follow: Has not seen the pulmonologist doctor, did not have insurance   Please document smoking status, how many packs? Ready to quit ?  - Smoking Hx: She has never smoked.   Objective   Vitals:   05/07/24 1545  BP: 133/83  Pulse: 95  Temp: 97.6 F (36.4 C)  TempSrc: Oral  SpO2: 100%  Weight: 257 lb 6.4 oz (116.8 kg)  Height: 5' 9 (1.753 m)    Physical Exam: General: Obese appearing, no acute distress  Cardiovascular: RR Pulmonary: Diffuse prolonged end expiratory wheeze present  Abdomen: Soft, nontender, nondistended MSK: No lower extremity edema bilaterally  Assessment and Plan    Patient Discussed with Dr Shawn   COPD with asthma [facility]) Assessment & Plan: 06/18/2022: FVC 2.12 L ( 61%), FEV1 1.21 L ( 44%), indicates severe airway obstruction. Possible mixed defect. Post bronchodilator response shows FVC 2.25 L ( 65%), FEV1 1.24 L (45%). There is a 2 % change in FEV1.  Appropriate medications: Reports that Breztri , Dulera , Albuterol  and nebulizer is not covered by her insurance  Refill for Breztri , Singulair  and nebulizer sent to [facility] pharmacy  Referral to Dr. Brinda [facility]) for medication assistance is placed   Confirm proper inhaler technique use taught by RN is done  Referral to Pulm is placed and contact is provided  Prednisone  taper therapy is sent to the pharmacy   Orders: -     AMB Referral [facility] Care Management -     Pulmonary Visit  Severe persistent asthma with exacerbation (HCC) -     Albuterol  Sulfate; Take 3 mLs (2.5 mg total) by nebulization every 6 (six) hours as  needed for wheezing or shortness of breath.  Dispense: 150 mL; Refill: 1  Moderate persistent asthma with exacerbation -     Ipratropium-Albuterol ; Take 3 mLs by nebulization every 6 (six) hours as needed.  Dispense: 36 mL; Refill: 2  Other orders -     Airsupra ; Inhale 2 Inhalations into the lungs every 2 (two) hours as needed (shortness of breath, wheezing and/or bronchospasms). Do not exceed more than 12 inhalations in a 24 hour period  Dispense: 10.7 g; Refill: 0 -     Breztri  Aerosphere; Inhale 2 puffs into the lungs in the morning and at bedtime.  Dispense: 10.7 g; Refill: 3 -     Montelukast  Sodium; Take 1 tablet (10 mg total) by mouth at bedtime.  Dispense: 90 tablet; Refill: 2 -     predniSONE ; Take 2 tablets (40 mg total) by mouth daily with breakfast for 5 days, THEN 1 tablet (20 mg total) daily with breakfast for 5 days, THEN 0.5 tablets (10 mg total) daily with breakfast for 5 days.  Dispense: 18 tablet; Refill: 0    "

## 2024-05-07 NOTE — Patient Instructions (Signed)
 Thank you, Ms.Janina L Karney for allowing us  to provide your care today. Today we discussed:  Start Prednisone  taper therapy today with 5 days of 40 mg prednisone , then 5 days of 20 mg prednisone , and 5 days of 10 mg prednisone  I sent refills for Breztri  inhaler, Albuterol  inhaler, and nebulizer  I sent referral to pulmonology, you can call them below  [facility]  [address], [address] [place] [address] [phone].  You will also be scheduled with Dr Brinda to help you with medications   I have ordered the following labs for you:  Lab Orders  No laboratory test(s) ordered today     Referrals ordered today:    Referral Orders         AMB Referral [facility]BCI Care Management         Ambulatory referral to Pulmonology      I have ordered the following medication/changed the following medications:   Stop the following medications: Medications Discontinued During This Encounter  Medication Reason   budesonide -glycopyrrolate -formoterol  (BREZTRI  AEROSPHERE) 160-9-4.8 MCG/ACT AERO inhaler    montelukast  (SINGULAIR ) 10 MG tablet Reorder   Albuterol -Budesonide  (AIRSUPRA ) 90-80 MCG/ACT AERO Reorder   albuterol  (PROVENTIL ) (2.5 MG/3ML) 0.083% nebulizer solution Reorder   ipratropium-albuterol  (DUONEB) 0.5-2.5 (3) MG/3ML SOLN Reorder   mometasone -formoterol  (DULERA ) 100-5 MCG/ACT AERO      Start the following medications: Meds ordered this encounter  Medications   montelukast  (SINGULAIR ) 10 MG tablet    Sig: Take 1 tablet (10 mg total) by mouth at bedtime.    Dispense:  90 tablet    Refill:  2   albuterol  (PROVENTIL ) (2.5 MG/3ML) 0.083% nebulizer solution    Sig: Take 3 mLs (2.5 mg total) by nebulization every 6 (six) hours as needed for wheezing or shortness of breath.    Dispense:  150 mL    Refill:  1   ipratropium-albuterol  (DUONEB) 0.5-2.5 (3) MG/3ML SOLN    Sig: Take 3 mLs by nebulization every 6 (six) hours as needed.    Dispense:  36 mL    Refill:  2    budesonide -glycopyrrolate -formoterol  (BREZTRI  AEROSPHERE) 160-9-4.8 MCG/ACT AERO inhaler    Sig: Inhale 2 puffs into the lungs in the morning and at bedtime.    Dispense:  10.7 g    Refill:  3   Albuterol -Budesonide  (AIRSUPRA ) 90-80 MCG/ACT AERO    Sig: Inhale 2 Inhalations into the lungs every 2 (two) hours as needed (shortness of breath, wheezing and/or bronchospasms). Do not exceed more than 12 inhalations in a 24 hour period    Dispense:  5.9 g    Refill:  0   predniSONE  (DELTASONE ) 20 MG tablet    Sig: Take 2 tablets (40 mg total) by mouth daily with breakfast for 5 days, THEN 1 tablet (20 mg total) daily with breakfast for 5 days, THEN 0.5 tablets (10 mg total) daily with breakfast for 5 days.    Dispense:  18 tablet    Refill:  0     Follow up: 6 months for COPD/Asthma     Remember:   Should you have any questions or concerns please call the internal medicine clinic at [phone].     Dr. [facility]

## 2024-05-07 NOTE — Telephone Encounter (Addendum)
 COPD : RN Telephone Call   Patient ID: Janice Blake Blush, female    DOB: 1970-12-03  Age: 54 y.o. MRN: [id number]  Ms.Janice Blake is a 54 y.o. who was called about COPD management.   Patient reports that they are taking the following medications for COPD:  yes  (  but she stated she  can't afford all of them at times )   Patient reports they DO or DO NOT have a prior pulmonologist who they follow: no  under our care   Please document smoking status, how many packs? Ready to quit ?  - Smoking Hx: She has never smoked.(  Second hand smoke ... stated the lives in a home with chain smokers )    mMRC (Modified Medical Research Council) Dyspnea Scale   Ask the following questions and please check the box that describes the most.   Choose ONE>   []  Grade 0: I only get breathless with strenuous exercise   []  Grade 1: I get short of breath when hurry on level ground or walking up a slight hill   []  Grade 2: On level Ground, I walk slower than people of the same age because of breathlessness or have to stop for breath when walking my own pace   []  Grade 3: I Stop for breath after walking about 100 yards or after a few minutes on level ground   []  Grade 4: I am too breathless to leave the house, or I am breathless when dressing   ( NONE OF THESE RELATE TO PATIENT )  CAT (COPD Assessment Test)   Please ask each of the following questions and select between 0-5 based on what describes the patient most accurately.  On a scale 0 to 5, do you never cough or cough all the time: 4  0 (I have no phlegm in my chest at all) 5 (My chest feels very tight) 1  0 (I am not limited doing any activities at home) 0 (I am confident leaving my home despite my lung condition) 5 (I don't sleep soundly because of my lung condition) On a scale 0 to 5, how would you describe your energy?: 1 (I have lots of energy)  Total: 15

## 2024-05-08 ENCOUNTER — Telehealth: Payer: Self-pay

## 2024-05-08 NOTE — Telephone Encounter (Signed)
 Prior Authorization for patient (Breztri  Aerosphere 160-9-4.8MCG/ACT aerosol) came through on cover my meds was submitted with last office notes awaiting approval or denial.  [id number]

## 2024-05-08 NOTE — Telephone Encounter (Signed)
 Prior Authorization for patient (Airsupra  90-80MCG/ACT aerosol) came through on cover my meds was submitted with last office notes awaiting approval or denial.  KEY:[id number]

## 2024-05-11 ENCOUNTER — Other Ambulatory Visit (HOSPITAL_COMMUNITY): Payer: Self-pay

## 2024-05-11 NOTE — Telephone Encounter (Signed)
 I called the patients insurance to obtain the pa status. Per Ronal she stated that the patient is not a member with them and she was not able to pull up the patient in their system. Lavern can you run a test claim please?

## 2024-05-12 NOTE — Telephone Encounter (Signed)
 Airsupra  not on patients formulary.

## 2024-05-12 NOTE — Telephone Encounter (Signed)
 Breztri  not on patients formulary.

## 2024-05-13 ENCOUNTER — Other Ambulatory Visit: Payer: Self-pay | Admitting: Student

## 2024-05-13 ENCOUNTER — Other Ambulatory Visit (HOSPITAL_COMMUNITY): Payer: Self-pay

## 2024-05-13 MED ORDER — TRELEGY ELLIPTA 200-62.5-25 MCG/ACT IN AEPB: 1.0000 | INHALATION_SPRAY | Freq: Every day | RESPIRATORY_TRACT | 3 refills | Status: AC

## 2024-05-15 ENCOUNTER — Telehealth: Payer: Self-pay | Admitting: *Deleted

## 2024-05-15 NOTE — Progress Notes (Unsigned)
 Care Guide Pharmacy Note  05/15/2024 Name: Janice Blake MRN: [id number] DOB: 11-15-1970  Referred By: Heddy Barren, DO Reason for referral: Complex Care Management (Initial outreach to schedule referral with PharmD )   Janice Blake is a 54 y.o. year old female who is a primary care patient of Tawkaliyar, Roya, DO.  Janice Blake was referred to the pharmacist for assistance related to: COPD  An unsuccessful telephone outreach was attempted today to contact the patient who was referred to the pharmacy team for assistance with medication assistance. Additional attempts will be made to contact the patient.  Harlene Satterfield  [facility]  Value-Based Care Institute, [facility] Guide  Direct Dial: [phone]  Fax [phone]

## 2024-05-19 NOTE — Progress Notes (Unsigned)
 Care Guide Pharmacy Note  05/19/2024 Name: EIRA ALPERT MRN: [id number] DOB: 06-10-1970  Referred By: Heddy Barren, DO Reason for referral: Complex Care Management (Initial outreach to schedule referral with PharmD )   Richerd LITTIE Blush is a 54 y.o. year old female who is a primary care patient of Tawkaliyar, Roya, DO.  Richerd LITTIE Blush was referred to the pharmacist for assistance related to: COPD  A second unsuccessful telephone outreach was attempted today to contact the patient who was referred to the pharmacy team for assistance with medication assistance. Additional attempts will be made to contact the patient.  Harlene Satterfield  [facility]  Value-Based Care Institute, [facility] Guide  Direct Dial: [phone]  Fax [phone]

## 2024-05-20 NOTE — Progress Notes (Signed)
 Care Guide Pharmacy Note  05/20/2024 Name: Janice Blake MRN: [id number] DOB: 1970-11-07  Referred By: Heddy Barren, DO Reason for referral: Complex Care Management (Initial outreach to schedule referral with PharmD )   Janice Blake is a 54 y.o. year old female who is a primary care patient of Tawkaliyar, Roya, DO.  Janice Blake was referred to the pharmacist for assistance related to: COPD  A third unsuccessful telephone outreach was attempted today to contact the patient who was referred to the pharmacy team for assistance with medication assistance. The Population Health team is pleased to engage with this patient at any time in the future upon receipt of referral and should he/she be interested in assistance from the Population Health team.  Harlene Satterfield  [facility]  Value-Based Care Institute, [facility] Guide  Direct Dial: [phone]  Fax [phone]

## 2024-06-18 ENCOUNTER — Ambulatory Visit (HOSPITAL_BASED_OUTPATIENT_CLINIC_OR_DEPARTMENT_OTHER): Payer: PRIVATE HEALTH INSURANCE | Admitting: Pulmonary Disease

## 2024-08-10 ENCOUNTER — Ambulatory Visit: Payer: Self-pay | Admitting: Student
